# Patient Record
Sex: Male | Born: 1960
Health system: Southern US, Community
[De-identification: ages and names within clinical notes are randomized; demographics above are authoritative.]

## PROBLEM LIST (undated history)

## (undated) DIAGNOSIS — E785 Hyperlipidemia, unspecified: Secondary | ICD-10-CM

## (undated) DIAGNOSIS — I5020 Unspecified systolic (congestive) heart failure: Secondary | ICD-10-CM

## (undated) DIAGNOSIS — I10 Essential (primary) hypertension: Secondary | ICD-10-CM

## (undated) DIAGNOSIS — I4891 Unspecified atrial fibrillation: Secondary | ICD-10-CM

## (undated) DIAGNOSIS — I499 Cardiac arrhythmia, unspecified: Secondary | ICD-10-CM

## (undated) DIAGNOSIS — Z8709 Personal history of other diseases of the respiratory system: Secondary | ICD-10-CM

## (undated) DIAGNOSIS — I340 Nonrheumatic mitral (valve) insufficiency: Secondary | ICD-10-CM

## (undated) DIAGNOSIS — Z8619 Personal history of other infectious and parasitic diseases: Secondary | ICD-10-CM

## (undated) DIAGNOSIS — E119 Type 2 diabetes mellitus without complications: Secondary | ICD-10-CM

## (undated) HISTORY — DX: Personal history of other diseases of the respiratory system: Z87.09

## (undated) HISTORY — DX: Type 2 diabetes mellitus without complications: E11.9

## (undated) HISTORY — DX: Unspecified systolic (congestive) heart failure: I50.20

## (undated) HISTORY — DX: Personal history of other infectious and parasitic diseases: Z86.19

## (undated) HISTORY — DX: Nonrheumatic mitral (valve) insufficiency: I34.0

## (undated) HISTORY — PX: SPINAL FUSION: SHX223

---

## 1999-01-09 ENCOUNTER — Encounter: Payer: Self-pay | Admitting: Neurosurgery

## 1999-01-09 ENCOUNTER — Ambulatory Visit (HOSPITAL_COMMUNITY): Admission: RE | Admit: 1999-01-09 | Discharge: 1999-01-09 | Payer: Self-pay | Admitting: Neurosurgery

## 1999-02-16 ENCOUNTER — Inpatient Hospital Stay (HOSPITAL_COMMUNITY): Admission: RE | Admit: 1999-02-16 | Discharge: 1999-02-17 | Payer: Self-pay | Admitting: Neurosurgery

## 1999-02-16 ENCOUNTER — Encounter: Payer: Self-pay | Admitting: Neurosurgery

## 2002-04-23 ENCOUNTER — Ambulatory Visit (HOSPITAL_COMMUNITY): Admission: RE | Admit: 2002-04-23 | Discharge: 2002-04-23 | Payer: Self-pay | Admitting: Cardiology

## 2002-04-27 ENCOUNTER — Ambulatory Visit (HOSPITAL_COMMUNITY): Admission: RE | Admit: 2002-04-27 | Discharge: 2002-04-27 | Payer: Self-pay | Admitting: Cardiology

## 2002-04-27 ENCOUNTER — Encounter: Payer: Self-pay | Admitting: Cardiology

## 2002-05-20 HISTORY — PX: MITRAL VALVE REPAIR: SHX2039

## 2002-06-04 ENCOUNTER — Encounter: Payer: Self-pay | Admitting: Thoracic Surgery (Cardiothoracic Vascular Surgery)

## 2002-06-08 ENCOUNTER — Encounter: Payer: Self-pay | Admitting: Thoracic Surgery (Cardiothoracic Vascular Surgery)

## 2002-06-08 ENCOUNTER — Inpatient Hospital Stay (HOSPITAL_COMMUNITY)
Admission: RE | Admit: 2002-06-08 | Discharge: 2002-06-15 | Payer: Self-pay | Admitting: Thoracic Surgery (Cardiothoracic Vascular Surgery)

## 2002-06-09 ENCOUNTER — Encounter: Payer: Self-pay | Admitting: Thoracic Surgery (Cardiothoracic Vascular Surgery)

## 2002-06-10 ENCOUNTER — Encounter: Payer: Self-pay | Admitting: Thoracic Surgery (Cardiothoracic Vascular Surgery)

## 2005-11-22 DIAGNOSIS — E785 Hyperlipidemia, unspecified: Secondary | ICD-10-CM | POA: Insufficient documentation

## 2007-08-12 HISTORY — PX: DOPPLER ECHOCARDIOGRAPHY: SHX263

## 2009-01-18 DIAGNOSIS — Z8709 Personal history of other diseases of the respiratory system: Secondary | ICD-10-CM

## 2009-01-18 HISTORY — DX: Personal history of other diseases of the respiratory system: Z87.09

## 2009-01-21 ENCOUNTER — Emergency Department: Payer: Self-pay | Admitting: Emergency Medicine

## 2009-01-21 ENCOUNTER — Ambulatory Visit: Payer: Self-pay | Admitting: Internal Medicine

## 2009-01-21 ENCOUNTER — Inpatient Hospital Stay: Payer: Self-pay | Admitting: Internal Medicine

## 2009-03-06 ENCOUNTER — Ambulatory Visit: Payer: Self-pay | Admitting: Specialist

## 2009-03-17 ENCOUNTER — Ambulatory Visit: Payer: Self-pay | Admitting: General Surgery

## 2009-03-20 ENCOUNTER — Ambulatory Visit: Payer: Self-pay | Admitting: General Surgery

## 2009-03-27 ENCOUNTER — Ambulatory Visit: Payer: Self-pay | Admitting: General Surgery

## 2009-04-13 ENCOUNTER — Ambulatory Visit: Payer: Self-pay | Admitting: General Surgery

## 2009-05-23 ENCOUNTER — Ambulatory Visit: Payer: Self-pay | Admitting: General Surgery

## 2009-06-27 ENCOUNTER — Ambulatory Visit: Payer: Self-pay | Admitting: Family Medicine

## 2011-08-22 IMAGING — CR DG CHEST 2V
1 series · 2 of 2 positions shown · non-contrast
Comparison: none

REASON FOR EXAM: pneumonia
COMMENTS:

[Series 1: view not recorded · 0.17mm/px · 2 of 2 slices shown]
[im 1/2]
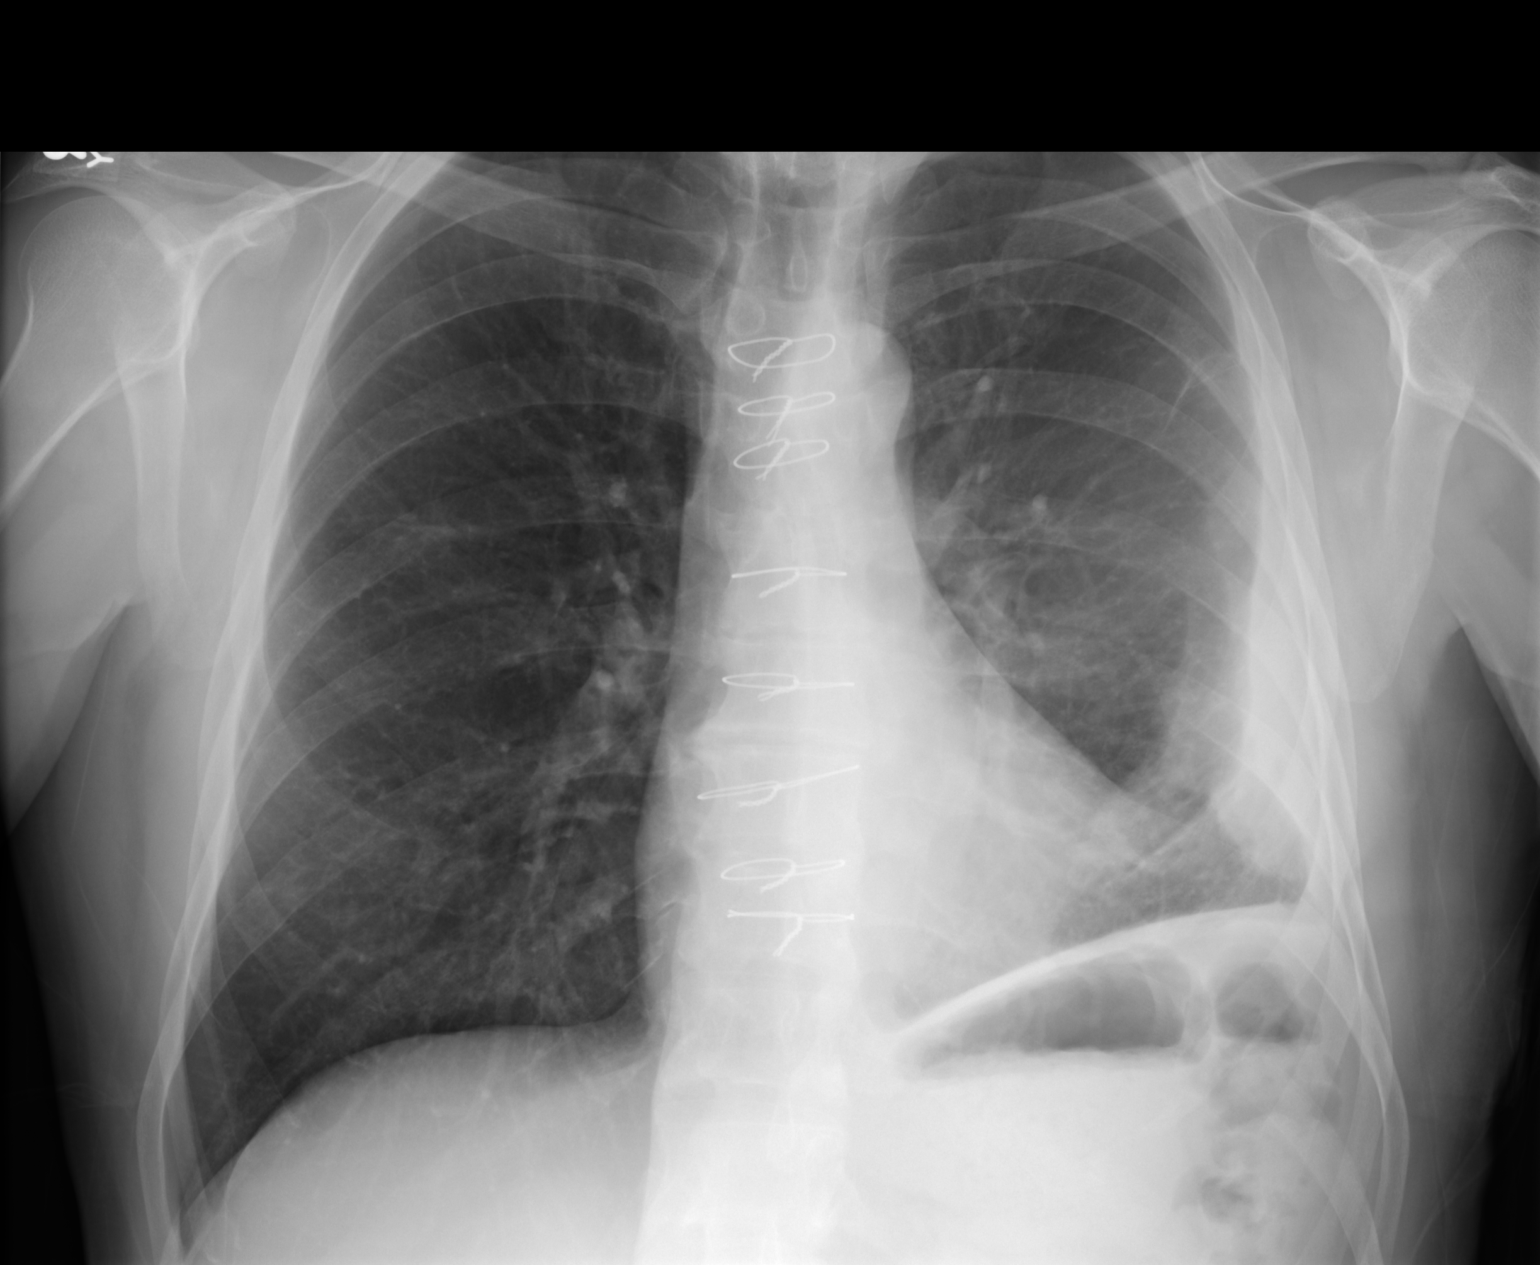
[im 2/2]
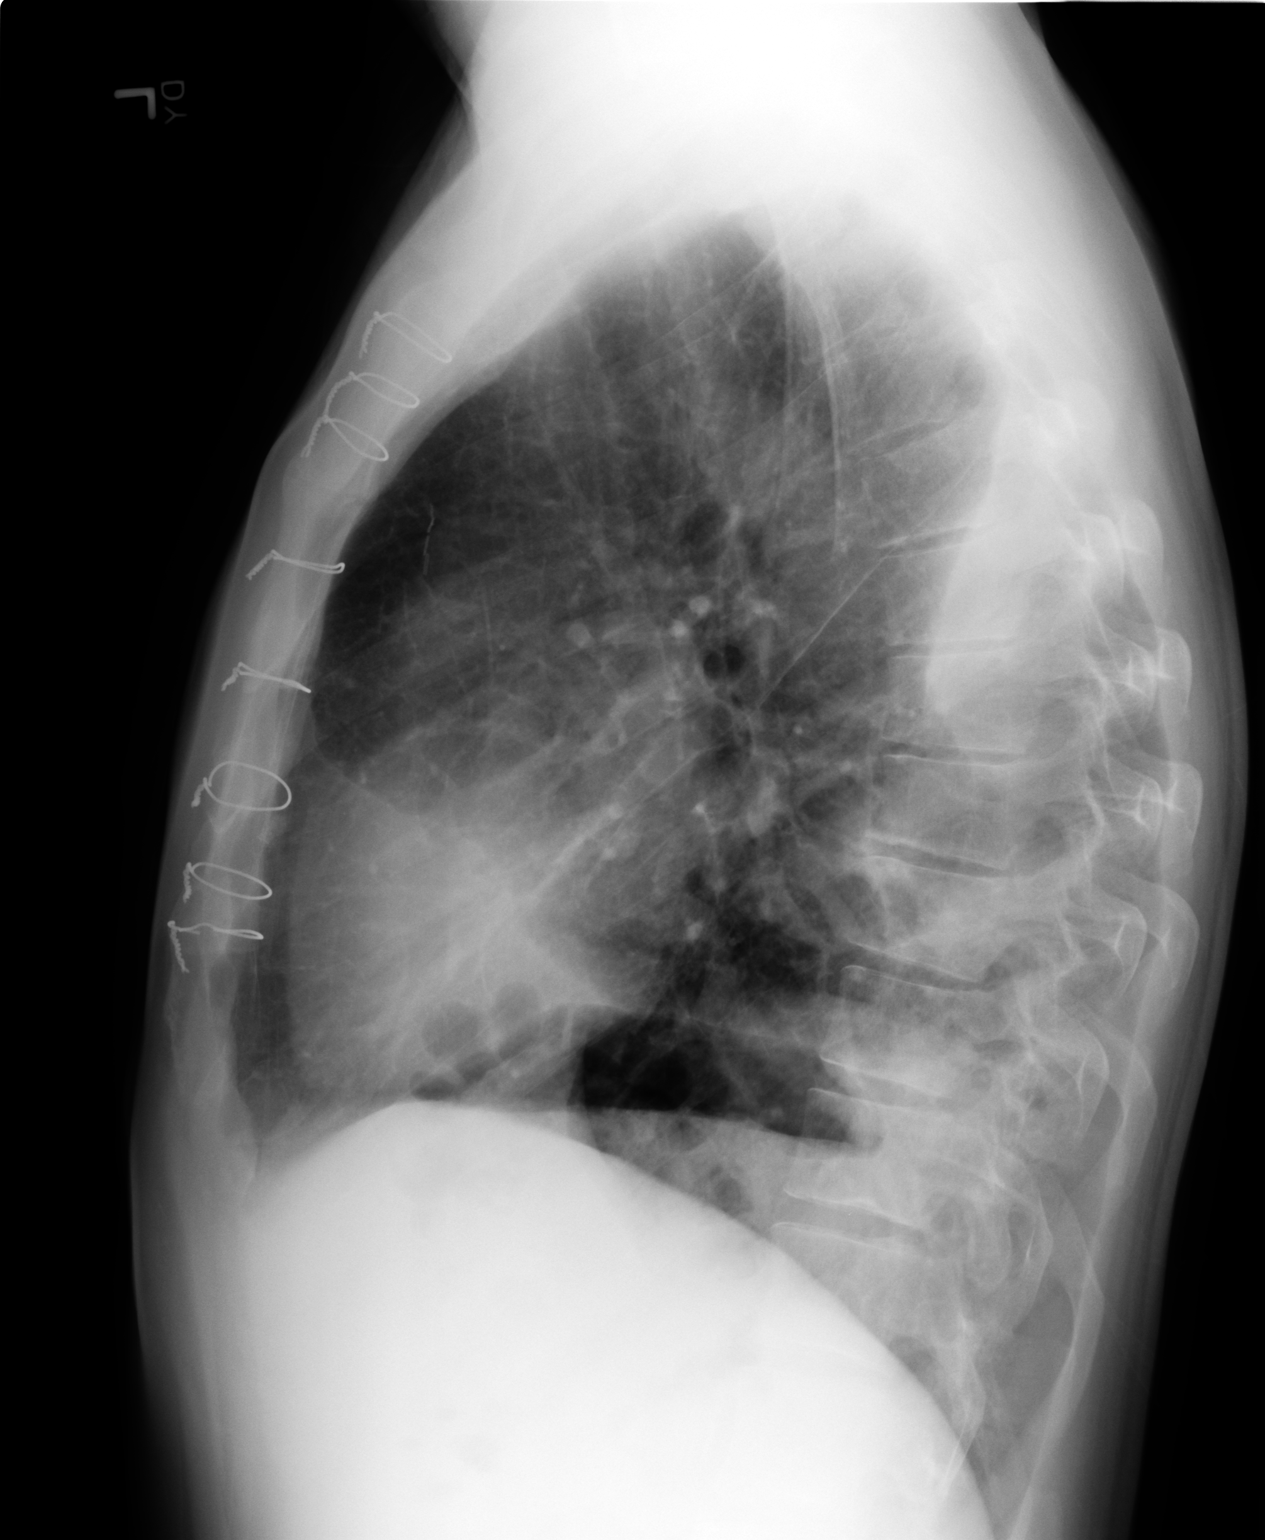

[2 of 2 positions shown; findings below may reference images not displayed]

PROCEDURE:     DXR - DXR CHEST PA (OR AP) AND LATERAL  - January 29, 2009  [DATE]

RESULT:     Comparison is made to the previous examination of 01/27/2009.

There is pleural thickening or residual pleural effusion on the left with
slight elevation of the left hemidiaphragm. Sternotomy wires are present.
The cardiac silhouette is normal. Previous anterior cervical fusion changes
are demonstrated. There is some basilar infiltrate or atelectasis present on
the left.
IMPRESSION: Improving aeration on the left with some residual
atelectasis or infiltrate and pleural thickening or pleural effusion.

## 2011-09-02 DIAGNOSIS — Z860101 Personal history of adenomatous and serrated colon polyps: Secondary | ICD-10-CM | POA: Insufficient documentation

## 2011-09-02 DIAGNOSIS — Z86010 Personal history of colonic polyps: Secondary | ICD-10-CM | POA: Insufficient documentation

## 2011-09-02 LAB — HM COLONOSCOPY

## 2012-05-18 ENCOUNTER — Ambulatory Visit: Payer: Self-pay | Admitting: Family Medicine

## 2012-12-09 LAB — PSA: PSA: 1.2

## 2013-12-06 LAB — LIPID PANEL
Cholesterol: 210 mg/dL — AB (ref 0–200)
HDL: 60 mg/dL (ref 35–70)
LDL Cholesterol: 136 mg/dL
TRIGLYCERIDES: 68 mg/dL (ref 40–160)

## 2013-12-06 LAB — BASIC METABOLIC PANEL
BUN: 19 mg/dL (ref 4–21)
CREATININE: 0.9 mg/dL (ref 0.6–1.3)
GLUCOSE: 116 mg/dL
Potassium: 5.6 mmol/L — AB (ref 3.4–5.3)
Sodium: 139 mmol/L (ref 137–147)

## 2014-06-14 LAB — HEMOGLOBIN A1C: HEMOGLOBIN A1C: 5.6 % (ref 4.0–6.0)

## 2014-08-26 ENCOUNTER — Other Ambulatory Visit: Payer: Self-pay | Admitting: Family Medicine

## 2014-08-26 NOTE — Telephone Encounter (Signed)
OK to refill

## 2014-08-26 NOTE — Telephone Encounter (Signed)
Prescription called in to pharmacy

## 2014-09-26 ENCOUNTER — Other Ambulatory Visit: Payer: Self-pay | Admitting: Family Medicine

## 2014-09-26 NOTE — Telephone Encounter (Signed)
Please call in the following medication.   Surescripts Out Interface   Mila Merry Rx Refill  4 hours ago   (9:32 AM)    [facility] [phone]   [facility]  4 hours ago   (9:32 AM)         New medications from outside sources are available for reconciliation.       Requested Medications     Medication name:  Name from pharmacy:  ALPRAZolam (XANAX) 1 MG tablet ALPRAZOLAM  TABLETS    Sig: TAKE 1/2 TO 1 TABLET BY MOUTH THREE TIMES DAILY AS NEEDED    Dispense: 90 tablet   Refills: 3

## 2014-09-26 NOTE — Telephone Encounter (Signed)
Rx called in to pharmacy. 

## 2014-10-06 ENCOUNTER — Other Ambulatory Visit: Payer: Self-pay

## 2014-10-06 ENCOUNTER — Encounter: Payer: Self-pay | Admitting: Emergency Medicine

## 2014-10-06 ENCOUNTER — Emergency Department
Admission: EM | Admit: 2014-10-06 | Discharge: 2014-10-06 | Disposition: A | Discharge status: Home / Self Care | Payer: BLUE CROSS/BLUE SHIELD | Attending: Student | Admitting: Student

## 2014-10-06 ENCOUNTER — Emergency Department: Payer: BLUE CROSS/BLUE SHIELD

## 2014-10-06 DIAGNOSIS — I10 Essential (primary) hypertension: Secondary | ICD-10-CM | POA: Diagnosis not present

## 2014-10-06 DIAGNOSIS — Z79899 Other long term (current) drug therapy: Secondary | ICD-10-CM | POA: Diagnosis not present

## 2014-10-06 DIAGNOSIS — R079 Chest pain, unspecified: Secondary | ICD-10-CM | POA: Insufficient documentation

## 2014-10-06 DIAGNOSIS — R0602 Shortness of breath: Secondary | ICD-10-CM | POA: Diagnosis present

## 2014-10-06 HISTORY — DX: Essential (primary) hypertension: I10

## 2014-10-06 LAB — CBC
HEMATOCRIT: 47.7 % (ref 40.0–52.0)
Hemoglobin: 16 g/dL (ref 13.0–18.0)
MCH: 30 pg (ref 26.0–34.0)
MCHC: 33.5 g/dL (ref 32.0–36.0)
MCV: 89.7 fL (ref 80.0–100.0)
Platelets: 280 10*3/uL (ref 150–440)
RBC: 5.32 MIL/uL (ref 4.40–5.90)
RDW: 13.5 % (ref 11.5–14.5)
WBC: 13.2 10*3/uL — AB (ref 3.8–10.6)

## 2014-10-06 LAB — BASIC METABOLIC PANEL
Anion gap: 9 (ref 5–15)
BUN: 19 mg/dL (ref 6–20)
CHLORIDE: 102 mmol/L (ref 101–111)
CO2: 27 mmol/L (ref 22–32)
CREATININE: 0.78 mg/dL (ref 0.61–1.24)
Calcium: 10.9 mg/dL — ABNORMAL HIGH (ref 8.9–10.3)
GFR calc Af Amer: >60 mL/min (ref >60)
GFR calc non Af Amer: >60 mL/min (ref >60)
Glucose, Bld: 124 mg/dL — ABNORMAL HIGH (ref 65–99)
POTASSIUM: 4.5 mmol/L (ref 3.5–5.1)
SODIUM: 138 mmol/L (ref 135–145)

## 2014-10-06 LAB — TROPONIN I
Troponin I: <0.03 ng/mL (ref <0.031)
Troponin I: <0.03 ng/mL (ref <0.031)

## 2014-10-06 LAB — HEPATIC FUNCTION PANEL
ALBUMIN: 4.8 g/dL (ref 3.5–5.0)
ALK PHOS: 86 U/L (ref 38–126)
ALT: 32 U/L (ref 17–63)
AST: 29 U/L (ref 15–41)
BILIRUBIN DIRECT: 0.1 mg/dL (ref 0.1–0.5)
BILIRUBIN TOTAL: 0.5 mg/dL (ref 0.3–1.2)
Indirect Bilirubin: 0.4 mg/dL (ref 0.3–0.9)
Total Protein: 7.7 g/dL (ref 6.5–8.1)

## 2014-10-06 MED ORDER — ASPIRIN 81 MG PO CHEW
324.0000 mg | CHEWABLE_TABLET | Freq: Once | ORAL | Status: AC
  Administered 2014-10-06: 324 mg via ORAL
  Filled 2014-10-06: qty 4

## 2014-10-06 NOTE — ED Provider Notes (Signed)
[facility] Emergency Department Provider Note  ____________________________________________  Time seen: Approximately 9:13 AM  I have reviewed the triage vital signs and the nursing notes.   HISTORY  Chief Complaint Shortness of Breath    HPI Samuel Woods is a 54 y.o. male with history of hypertension, anxiety, hypercholesterolemia, history of mitral valve replair on aspirin who presents for evaluation of sudden onset substernal chest pain. Patient awoke from sleep at 3:30 this morning with severe substernal chest pain and a sensation that his "heart was going to explode". He reports it lasted for 2 hours and has subsided. Currently he feels "sore" in the middle of his chest but denies any other pain. He is complaining of mild shortness of breath. His chest pain was associated with mild shortness of breath when it was at its maximum. No nausea, no diaphoresis. He has had productive cough for the past 2 weeks. No fever. No modifying factors. He has had chest pain like this before "after my mitral valve surgery whenever I would cough".   Past Medical History  Diagnosis Date  . Hypertension   . Anxiety   . Hypercholesteremia     There are no active problems to display for this patient.   History reviewed. No pertinent past surgical history.  Current Outpatient Rx  Name  Route  Sig  Dispense  Refill  . ALPRAZolam (XANAX) 1 MG tablet      TAKE 1/2 TO 1 TABLET BY MOUTH THREE TIMES DAILY AS NEEDED   90 tablet   3   . amLODipine (NORVASC) 10 MG tablet   Oral   Take 10 mg by mouth daily.         Marland Kitchen aspirin EC 81 MG tablet   Oral   Take 243 mg by mouth once. PT states that he takes which every aspirin is the cheapest. Sometimes its the 81mg  and sometimes 325mg .         . atorvastatin (LIPITOR) 20 MG tablet   Oral   Take 20 mg by mouth daily.         . Multiple Vitamin (MULTIVITAMIN) capsule   Oral   Take 1 capsule by mouth daily.            Allergies Review of patient's allergies indicates not on file.  History reviewed. No pertinent family history.  Social History Social History  Substance Use Topics  . Smoking status: None  . Smokeless tobacco: None  . Alcohol Use: None    Review of Systems Constitutional: No fever/chills Eyes: No visual changes. ENT: No sore throat. Cardiovascular: + chest pain. Respiratory: + shortness of breath. Gastrointestinal: No abdominal pain.  No nausea, no vomiting.  No diarrhea.  No constipation. Genitourinary: Negative for dysuria. Musculoskeletal: Negative for back pain. Skin: Negative for rash. Neurological: Negative for headaches, focal weakness or numbness.  10-point ROS otherwise negative.  ____________________________________________   PHYSICAL EXAM:  VITAL SIGNS: ED Triage Vitals  Enc Vitals Group     BP 10/06/14 0858 182/101 mmHg     Pulse Rate 10/06/14 0858 88     Resp 10/06/14 0858 18     Temp 10/06/14 0858 98 F (36.7 C)     Temp Source 10/06/14 0858 Oral     SpO2 10/06/14 0858 96 %     Weight 10/06/14 0858 175 lb (79.379 kg)     Height 10/06/14 0858 6' (1.829 m)     Head Cir --  Peak Flow --      Pain Score 10/06/14 0858 3     Pain Loc --      Pain Edu? --      Excl. in GC? --     Constitutional: Alert and oriented. Well appearing and in no acute distress. Eyes: Conjunctivae are normal. PERRL. EOMI. Head: Atraumatic. Nose: No congestion/rhinnorhea. Mouth/Throat: Mucous membranes are moist.  Oropharynx non-erythematous. Neck: No stridor.   Cardiovascular: Normal rate, regular rhythm. + systolic murmur.  Good peripheral circulation. Respiratory: Normal respiratory effort.  No retractions. Lungs CTAB. Gastrointestinal: Soft and nontender. No distention. No abdominal bruits. No CVA tenderness. Genitourinary: deferred Musculoskeletal: No lower extremity tenderness nor edema.  No joint effusions. Tenderness to palpation in the lower  sternum. Neurologic:  Normal speech and language. No gross focal neurologic deficits are appreciated. No gait instability. Skin:  Skin is warm, dry and intact. No rash noted. Psychiatric: Mood and affect are normal. Speech and behavior are normal.  ____________________________________________   LABS (all labs ordered are listed, but only abnormal results are displayed)  Labs Reviewed  BASIC METABOLIC PANEL - Abnormal; Notable for the following:    Glucose, Bld 124 (*)    Calcium 10.9 (*)    All other components within normal limits  CBC - Abnormal; Notable for the following:    WBC 13.2 (*)    All other components within normal limits  TROPONIN I  HEPATIC FUNCTION PANEL  TROPONIN I   ____________________________________________  EKG  ED ECG REPORT I, Gayla Doss, the attending physician, personally viewed and interpreted this ECG.   Date: 10/06/2014  EKG Time: 09:00  Rate: 90  Rhythm: normal sinus rhythm  Axis: normal  Intervals:none  ST&T Change: No acute ST segment elevation. Q-wave in lead V1, V2. T wave inversions in lead aVL. Similar to EKG on 01/21/2010.  ____________________________________________  RADIOLOGY  CXR  FINDINGS: A very small focus of linear scar is seen in the left lower lobe, unchanged. The lungs are otherwise clear. Heart size is normal. The patient is status post CABG. No pneumothorax or pleural effusion. No focal bony abnormality. Postoperative change of lower cervical fusion is noted.  IMPRESSION: No active cardiopulmonary disease.  ____________________________________________   PROCEDURES  Procedure(s) performed: None  Critical Care performed: No  ____________________________________________   INITIAL IMPRESSION / ASSESSMENT AND PLAN / ED COURSE  Pertinent labs & imaging results that were available during my care of the patient were reviewed by me and considered in my medical decision making (see chart for  details).  Samuel Woods is a 54 y.o. male with history of hypertension, anxiety, hypercholesterolemia, history of mitral valve replair on aspirin who presents for evaluation of sudden onset substernal chest pain initially severe, now faint. It was nonradiating and occurred at rest. EKG is unchanged from prior. Pain is not exertional, does not radiate, not associate with any nausea or sudden diaphoresis. He has no personal history of coronary artery disease or family history of coronary artery disease. He is low risk for ACS however will obtain 2 sets of troponins. He has no risk factors for PE, his pain is not pleuritic in nature, is not hypoxic or tachypnea, and I doubt PE. Pain not ripping or tearing in nature, does not radiate to the back or down towards the feet and I think acute aortic dissection is unlikely. He does have tenderness to palpation in the lower sternum and given recent cough, this may be musculo skeletal in nature.  At this time, patient appears well, vital signs stable, he is afebrile. Plan for screening cardiac labs, EKG, chest x-ray. We'll give aspirin. Reassess for disposition.  ----------------------------------------- 3:50 PM on 10/06/2014 ----------------------------------------- Labs notable for mild leukocytosis. 2 sets of troponins are Negative (less than 0.03). The patient is asymptomatic with complete resolution of pain at this time. He has not had any recurrence of the chest pain he experienced this morning while being observed in the emergency department for the past 6 hours.. Aspirin was given. Chest x-ray read notes that the patient is "status post CABG" however the patient adamantly denies any history of coronary artery disease, no history of bypass, the only operation that he's had is mitral valve repair. I doubt ACS. We discussed extensive return precautions, symptomatic treatment for his pain with Motrin Tylenol, and need for close PCP follow-up. He is verbal with the  discharge plan.  ____________________________________________   FINAL CLINICAL IMPRESSION(S) / ED DIAGNOSES  Final diagnoses:  Chest pain, unspecified chest pain type      Gayla Doss, MD 10/06/14 1555

## 2014-10-06 NOTE — ED Notes (Signed)
Occasional cough with yellowish sputum, pt currently with even unlabored respirations, speaking in complete sentences, complaining of chest soreness 2/10

## 2014-10-06 NOTE — ED Notes (Signed)
Patient reports waking this morning with "massive" chest pain that lasted from about 3:30 to 5:30 now reports just being extremely short of breath.

## 2014-10-18 ENCOUNTER — Other Ambulatory Visit: Payer: Self-pay | Admitting: Family Medicine

## 2014-10-18 DIAGNOSIS — M545 Low back pain: Secondary | ICD-10-CM

## 2014-10-19 NOTE — Telephone Encounter (Signed)
Ok to phone in rx. Thanks.  

## 2014-10-19 NOTE — Telephone Encounter (Signed)
Last prescribed 07/24/2013 # 30 with 6 refills. Last office visit was 06/14/2014 for follow up HTN, Lumbago, Anxiety and prediabetes.

## 2014-12-09 DIAGNOSIS — I10 Essential (primary) hypertension: Secondary | ICD-10-CM | POA: Insufficient documentation

## 2014-12-09 DIAGNOSIS — E669 Obesity, unspecified: Secondary | ICD-10-CM | POA: Insufficient documentation

## 2014-12-09 DIAGNOSIS — R7303 Prediabetes: Secondary | ICD-10-CM | POA: Insufficient documentation

## 2014-12-09 DIAGNOSIS — E119 Type 2 diabetes mellitus without complications: Secondary | ICD-10-CM | POA: Insufficient documentation

## 2014-12-12 ENCOUNTER — Encounter: Payer: Self-pay | Admitting: Family Medicine

## 2014-12-12 ENCOUNTER — Ambulatory Visit (INDEPENDENT_AMBULATORY_CARE_PROVIDER_SITE_OTHER): Payer: BLUE CROSS/BLUE SHIELD | Admitting: Family Medicine

## 2014-12-12 VITALS — BP 120/78 | HR 86 | Temp 98.8°F | Resp 16 | Ht 72.0 in | Wt 191.0 lb

## 2014-12-12 DIAGNOSIS — I10 Essential (primary) hypertension: Secondary | ICD-10-CM

## 2014-12-12 DIAGNOSIS — E785 Hyperlipidemia, unspecified: Secondary | ICD-10-CM | POA: Diagnosis not present

## 2014-12-12 DIAGNOSIS — M4716 Other spondylosis with myelopathy, lumbar region: Secondary | ICD-10-CM | POA: Diagnosis not present

## 2014-12-12 DIAGNOSIS — Z23 Encounter for immunization: Secondary | ICD-10-CM | POA: Diagnosis not present

## 2014-12-12 DIAGNOSIS — R7303 Prediabetes: Secondary | ICD-10-CM | POA: Diagnosis not present

## 2014-12-12 LAB — POCT GLYCOSYLATED HEMOGLOBIN (HGB A1C)
Est. average glucose Bld gHb Est-mCnc: 120
HEMOGLOBIN A1C: 5.8

## 2014-12-12 MED ORDER — OXYCODONE-ACETAMINOPHEN 5-325 MG PO TABS: 1.0000 | ORAL_TABLET | Freq: Three times a day (TID) | ORAL | Status: DC | PRN

## 2014-12-12 NOTE — Progress Notes (Signed)
Patient: Samuel Woods Male    DOB: 06/26/1960   54 y.o.   MRN: [id number] Visit Date: 12/12/2014  Today's Provider: Mila Merryonald Fisher, MD   Chief Complaint  Patient presents with  . Follow-up  . Hypertension  . Anxiety  . Back Pain   Subjective:    HPI   Pre-diabetes Follow-up for pre-diabetes. Is generally avoid sweets and starchy foods in diet.  Lab Results  Component Value Date   HGBA1C 5.6 06/14/2014     Back pain States that occasional oxycodone/apap continues to work well. He takes one a couple of time of week to help rest when pain flares Up. Marland Kitchen.   Hypertension, follow-up:  BP Readings from Last 3 Encounters:  12/12/14 120/78  06/14/14 120/82  10/06/14 144/93    He was last seen for hypertension 6 months ago.  BP at that visit was 120/82. Management since that visit includes none. He reports good compliance with treatment. He is not having side effects. none  He is exercising. He is adherent to low salt diet.   Outside blood pressures are n/a. He is experiencing chest pain.  Patient denies chest pain.   Cardiovascular risk factors include none.  Use of agents associated with hypertension: none.     Weight trend: fluctuating a bit Wt Readings from Last 3 Encounters:  12/12/14 191 lb (86.637 kg)  06/14/14 190 lb (86.183 kg)  10/06/14 175 lb (79.379 kg)    Current diet: well balanced  ----------------------------------------------------------------------    No Known Allergies Previous Medications   ALPRAZOLAM (XANAX) 1 MG TABLET    TAKE 1/2 TO 1 TABLET BY MOUTH THREE TIMES DAILY AS NEEDED   AMLODIPINE (NORVASC) 10 MG TABLET    Take 10 mg by mouth daily.   ASPIRIN 325 MG EC TABLET    Take 162.5 mg by mouth daily.   ASPIRIN EC 81 MG TABLET    Take 243 mg by mouth once. PT states that he takes which every aspirin is the cheapest. Sometimes its the 81mg  and sometimes 325mg .   ATORVASTATIN (LIPITOR) 20 MG TABLET    Take 20 mg by mouth daily.   CYCLOBENZAPRINE (FLEXERIL) 5 MG TABLET    TAKE 1 TABLET BY MOUTH EVERY 6 HOURS AS NEEDED   IBUPROFEN (ADVIL,MOTRIN) 200 MG TABLET    Take 200 mg by mouth every 6 (six) hours as needed.   MULTIPLE VITAMIN (MULTIVITAMIN) CAPSULE    Take 1 capsule by mouth daily.   OXYCODONE-ACETAMINOPHEN (PERCOCET) 5-325 MG TABLET    Take 1 tablet by mouth daily as needed for severe pain. Up to 1 daily    Review of Systems  Cardiovascular: Negative for chest pain, palpitations and leg swelling.  Musculoskeletal: Positive for back pain.  Neurological: Negative for dizziness and light-headedness.    Social History  Substance Use Topics  . Smoking status: Former Games developermoker  . Smokeless tobacco: Not on file  . Alcohol Use: 0.0 oz/week    0 Standard drinks or equivalent per week     Comment: occasional use   Objective:   BP 120/78 mmHg  Pulse 86  Temp(Src) 98.8 F (37.1 C) (Oral)  Resp 16  Ht 6' (1.829 m)  Wt 191 lb (86.637 kg)  BMI 25.90 kg/m2  SpO2 97%  Physical Exam   General Appearance:    Alert, cooperative, no distress  Eyes:    PERRL, conjunctiva/corneas clear, EOM's intact       Lungs:  Clear to auscultation bilaterally, respirations unlabored  Heart:    Regular rate and rhythm  Neurologic:   Awake, alert, oriented x 3. No apparent focal neurological           defect.       HgbA1c=5.8 Depression screen [facility] 2/9 12/12/2014  Decreased Interest 0  Down, Depressed, Hopeless 0  PHQ - 2 Score 0  Altered sleeping 0  Tired, decreased energy 0  Change in appetite 0  Feeling bad or failure about yourself  0  Trouble concentrating 0  Moving slowly or fidgety/restless 0  Suicidal thoughts 0  PHQ-9 Score 0  Difficult doing work/chores Not difficult at all        Assessment & Plan:     1. Essential hypertension Well controlled.  Continue current medications.   - Renal function panel  2. HLD (hyperlipidemia) He is tolerating atorvastatin well with no adverse effects.   - Lipid  panel - Hepatic function panel  3. Prediabetes Stable. Continue low glycemic index diet  4. Lumbar spondylosis with myelopathy  - oxyCODONE-acetaminophen (PERCOCET) 5-325 MG tablet; Take 1 tablet by mouth every 8 (eight) hours as needed for severe pain.  Dispense: 60 tablet; Refill: 0  5. Need for influenza vaccination  - Flu Vaccine QUAD 36+ mos IM       Mila Merry, MD  [facility]

## 2014-12-13 LAB — HEPATIC FUNCTION PANEL
ALT: 41 IU/L (ref 0–44)
AST: 21 IU/L (ref 0–40)
Alkaline Phosphatase: 126 IU/L — ABNORMAL HIGH (ref 39–117)
BILIRUBIN, DIRECT: 0.08 mg/dL (ref 0.00–0.40)
Bilirubin Total: 0.3 mg/dL (ref 0.0–1.2)
Total Protein: 7.2 g/dL (ref 6.0–8.5)

## 2014-12-13 LAB — RENAL FUNCTION PANEL
ALBUMIN: 5.1 g/dL (ref 3.5–5.5)
BUN/Creatinine Ratio: 23 — ABNORMAL HIGH (ref 9–20)
BUN: 21 mg/dL (ref 6–24)
CALCIUM: 10.4 mg/dL — AB (ref 8.7–10.2)
CHLORIDE: 104 mmol/L (ref 97–106)
CO2: 21 mmol/L (ref 18–29)
CREATININE: 0.9 mg/dL (ref 0.76–1.27)
GFR calc Af Amer: 111 mL/min/{1.73_m2} (ref >59)
GFR calc non Af Amer: 96 mL/min/{1.73_m2} (ref >59)
Glucose: 122 mg/dL — ABNORMAL HIGH (ref 65–99)
PHOSPHORUS: 3.8 mg/dL (ref 2.5–4.5)
POTASSIUM: 6.3 mmol/L — AB (ref 3.5–5.2)
SODIUM: 146 mmol/L — AB (ref 136–144)

## 2014-12-13 LAB — LIPID PANEL
CHOLESTEROL TOTAL: 215 mg/dL — AB (ref 100–199)
Chol/HDL Ratio: 4.4 ratio units (ref 0.0–5.0)
HDL: 49 mg/dL (ref >39)
LDL CALC: 121 mg/dL — AB (ref 0–99)
TRIGLYCERIDES: 226 mg/dL — AB (ref 0–149)
VLDL Cholesterol Cal: 45 mg/dL — ABNORMAL HIGH (ref 5–40)

## 2014-12-14 ENCOUNTER — Telehealth: Payer: Self-pay

## 2014-12-14 MED ORDER — HYDROCHLOROTHIAZIDE 25 MG PO TABS: 25.0000 mg | ORAL_TABLET | Freq: Every day | ORAL | Status: DC

## 2014-12-14 NOTE — Telephone Encounter (Signed)
Patient's wife Beverely LowJeannie (consent in chart) advised as directed below. RX sent to pharmacy. Patient will call back to schedule a follow up appointment.

## 2014-12-14 NOTE — Telephone Encounter (Signed)
-----   Message from Malva Limesonald E Fisher, MD sent at 12/13/2014  8:11 AM EDT ----- Potassium level is getting very high, which might be side effect of his blood pressure medication. Need to change from amlodipine to hydrochlorothiazide 25mg  once a day, #30.  Need to follow up 7-10 days to check potassium level and blood pressure.

## 2015-01-09 ENCOUNTER — Telehealth: Payer: Self-pay | Admitting: Family Medicine

## 2015-01-09 DIAGNOSIS — I10 Essential (primary) hypertension: Secondary | ICD-10-CM

## 2015-01-09 NOTE — Telephone Encounter (Signed)
Please print rx for renal panel for hypertension and leave at front desk. Thanks.

## 2015-01-09 NOTE — Telephone Encounter (Signed)
done

## 2015-01-09 NOTE — Telephone Encounter (Signed)
Pt needs order to have his potassium check because of the prescription he is now taking,.  Please call him when it is ready to be picked up..  Call back is [phone]  [facility], [place]

## 2015-01-10 ENCOUNTER — Other Ambulatory Visit: Payer: Self-pay | Admitting: Family Medicine

## 2015-01-25 ENCOUNTER — Other Ambulatory Visit: Payer: Self-pay | Admitting: Family Medicine

## 2015-01-25 NOTE — Telephone Encounter (Signed)
Rx called in to pharmacy. 

## 2015-01-25 NOTE — Telephone Encounter (Signed)
Please call in alprazolam.  

## 2015-02-07 ENCOUNTER — Telehealth: Payer: Self-pay | Admitting: Family Medicine

## 2015-02-07 NOTE — Telephone Encounter (Signed)
Please advise patient he needs to stop by lab to recheck renal panel, since his potassium was very high before we changed his BP medication last month. Lab order has been left at front desk to pick up. Does not need to be fasting. Thanks.

## 2015-02-17 NOTE — Telephone Encounter (Signed)
LMTCB

## 2015-02-17 NOTE — Telephone Encounter (Signed)
Patient's wife Jeronimo NormaJeanie advised as directed below.

## 2015-02-17 NOTE — Telephone Encounter (Signed)
Pt wife, Jeronimo NormaJeanie is returning call.  CB#[phone]/MW

## 2015-03-07 ENCOUNTER — Telehealth: Payer: Self-pay

## 2015-03-07 LAB — RENAL FUNCTION PANEL
ALBUMIN: 4.4 g/dL (ref 3.5–5.5)
BUN / CREAT RATIO: 23 — AB (ref 9–20)
BUN: 19 mg/dL (ref 6–24)
CALCIUM: 9.7 mg/dL (ref 8.7–10.2)
CHLORIDE: 98 mmol/L (ref 96–106)
CO2: 24 mmol/L (ref 18–29)
CREATININE: 0.84 mg/dL (ref 0.76–1.27)
GFR calc Af Amer: 114 mL/min/{1.73_m2} (ref >59)
GFR calc non Af Amer: 99 mL/min/{1.73_m2} (ref >59)
Glucose: 130 mg/dL — ABNORMAL HIGH (ref 65–99)
PHOSPHORUS: 3.3 mg/dL (ref 2.5–4.5)
Potassium: 4.7 mmol/L (ref 3.5–5.2)
Sodium: 139 mmol/L (ref 134–144)

## 2015-03-07 NOTE — Telephone Encounter (Signed)
-----   Message from Malva Limes, MD sent at 03/07/2015  8:00 AM EST ----- Potassium level is back down to normal. Continue current medications. Need follow up o.v. for BP or CPE in 2-3 months.

## 2015-03-07 NOTE — Telephone Encounter (Signed)
Advised patient's wife as below.  

## 2015-03-07 NOTE — Telephone Encounter (Signed)
Left message to call back  

## 2015-03-27 ENCOUNTER — Other Ambulatory Visit: Payer: Self-pay | Admitting: Family Medicine

## 2015-03-27 NOTE — Telephone Encounter (Signed)
Please call in alprazolam.  

## 2015-03-27 NOTE — Telephone Encounter (Signed)
Rx called in to pharmacy. 

## 2015-04-15 ENCOUNTER — Ambulatory Visit (INDEPENDENT_AMBULATORY_CARE_PROVIDER_SITE_OTHER): Payer: BLUE CROSS/BLUE SHIELD | Admitting: Family Medicine

## 2015-04-15 ENCOUNTER — Encounter: Payer: Self-pay | Admitting: Family Medicine

## 2015-04-15 VITALS — BP 122/82 | HR 79 | Temp 97.6°F | Resp 16 | Wt 182.4 lb

## 2015-04-15 DIAGNOSIS — J40 Bronchitis, not specified as acute or chronic: Secondary | ICD-10-CM

## 2015-04-15 MED ORDER — HYDROCODONE-HOMATROPINE 5-1.5 MG/5ML PO SYRP: ORAL_SOLUTION | ORAL | Status: DC

## 2015-04-15 MED ORDER — CEFDINIR 300 MG PO CAPS: 300.0000 mg | ORAL_CAPSULE | Freq: Two times a day (BID) | ORAL | Status: DC

## 2015-04-15 NOTE — Patient Instructions (Signed)
Add Mucinex D and add Delsym for cough as needed.

## 2015-04-15 NOTE — Progress Notes (Signed)
Subjective:     Patient ID: Samuel Woods, male   DOB: 18-Jun-1960, 55 y.o.   MRN: [id number]  HPI  Chief Complaint  Patient presents with  . Ear Pain    Patient comes in office today with complaints of ear pain, cough and congestion for the past week. Associated patient complains of decreased appetite and nausea, he denies diarrhea or vomitting.   Developed flu symptoms despite flu shot on 2/18. Feels that fever broke on 2/23 and he started to feel better. Sinus congestion improving but has developed cough productive of purulent sputum. Admits to resuming smoking 2-3 cigarettes a day.   Review of Systems     Objective:   Physical Exam  Constitutional: He appears well-developed and well-nourished. No distress.  Ears: T.M's intact without inflammation Sinuses: non-tender Throat: no tonsillar enlargement or exudate Neck: right anterior cervical tenderness Lungs: Inspiratory and expiratory wheezes/crackles     Assessment:    1. Bronchitis - cefdinir (OMNICEF) 300 MG capsule; Take 1 capsule (300 mg total) by mouth 2 (two) times daily.  Dispense: 14 capsule; Refill: 0 - HYDROcodone-homatropine (HYCODAN) 5-1.5 MG/5ML syrup; 5 ml 4-6 hours as needed for cough  Dispense: 240 mL; Refill: 0    Plan:    Discussed use of expectorants.

## 2015-04-17 ENCOUNTER — Other Ambulatory Visit: Payer: Self-pay | Admitting: Family Medicine

## 2015-04-17 DIAGNOSIS — M4716 Other spondylosis with myelopathy, lumbar region: Secondary | ICD-10-CM

## 2015-04-17 MED ORDER — OXYCODONE-ACETAMINOPHEN 5-325 MG PO TABS: 1.0000 | ORAL_TABLET | Freq: Three times a day (TID) | ORAL | Status: DC | PRN

## 2015-04-17 NOTE — Progress Notes (Signed)
Oxycodone rx is ready to pick up. But, he should not take this while he is taking the cough medication that was prescribed this weekend.

## 2015-04-17 NOTE — Progress Notes (Signed)
Patient was notified. Patient stated that he is not taking cough medication due to med giving him headaches.

## 2015-04-29 ENCOUNTER — Other Ambulatory Visit: Payer: Self-pay | Admitting: Family Medicine

## 2015-06-13 ENCOUNTER — Ambulatory Visit (INDEPENDENT_AMBULATORY_CARE_PROVIDER_SITE_OTHER): Payer: BLUE CROSS/BLUE SHIELD | Admitting: Family Medicine

## 2015-06-13 ENCOUNTER — Encounter: Payer: Self-pay | Admitting: Family Medicine

## 2015-06-13 VITALS — BP 140/88 | HR 88 | Temp 98.2°F | Resp 16 | Wt 182.0 lb

## 2015-06-13 DIAGNOSIS — N529 Male erectile dysfunction, unspecified: Secondary | ICD-10-CM

## 2015-06-13 DIAGNOSIS — I10 Essential (primary) hypertension: Secondary | ICD-10-CM

## 2015-06-13 DIAGNOSIS — M4716 Other spondylosis with myelopathy, lumbar region: Secondary | ICD-10-CM | POA: Diagnosis not present

## 2015-06-13 DIAGNOSIS — R7303 Prediabetes: Secondary | ICD-10-CM | POA: Diagnosis not present

## 2015-06-13 LAB — POCT GLYCOSYLATED HEMOGLOBIN (HGB A1C)
Est. average glucose Bld gHb Est-mCnc: 123
Hemoglobin A1C: 5.9

## 2015-06-13 MED ORDER — OXYCODONE-ACETAMINOPHEN 5-325 MG PO TABS: 1.0000 | ORAL_TABLET | Freq: Three times a day (TID) | ORAL | Status: DC | PRN

## 2015-06-13 MED ORDER — SILDENAFIL CITRATE 20 MG PO TABS: ORAL_TABLET | ORAL | Status: DC

## 2015-06-13 NOTE — Progress Notes (Signed)
Patient: Samuel Woods Male    DOB: 02/14/1961   55 y.o.   MRN: [id number] Visit Date: 06/13/2015  Today's Provider: Mila Merryonald Bhavin Monjaraz, MD   Chief Complaint  Patient presents with  . Hypertension    follow up  . Back Pain    follow up  . Hyperglycemia    follow up   Subjective:    HPI  Hypertension, follow-up:  BP Readings from Last 3 Encounters:  04/15/15 122/82  12/12/14 120/78  06/14/14 120/82    He was last seen for hypertension 6 months ago.  BP at that visit was 120/78. Management since that visit includes changing from Amlodpine to HCTZ due to potassium level getting too high. He reports good compliance with treatment. He is not having side effects.  He is exercising. He is not adherent to low salt diet.   Outside blood pressures are not being checked. He is experiencing none.  Patient denies chest pain, chest pressure/discomfort, claudication, dyspnea, exertional chest pressure/discomfort, fatigue, irregular heart beat, lower extremity edema, near-syncope, orthopnea, palpitations, paroxysmal nocturnal dyspnea, syncope and tachypnea.   Cardiovascular risk factors include dyslipidemia and hypertension.  Use of agents associated with hypertension: NSAIDS.     Weight trend: stable Wt Readings from Last 3 Encounters:  04/15/15 182 lb 6.4 oz (82.736 kg)  12/12/14 191 lb (86.637 kg)  06/14/14 190 lb (86.183 kg)    Current diet: well balanced   ED He took Cialis in the past which he states worked well. He has not required it for several years but states he has been having more difficulty the last several months and would like to try a generic ED medication.    Follow up Back pain: Last office visit was 6 months ago and no changes were made. Patient reports good compliance with treatment and good symptom control.     Prediabetes, Follow-up:   Lab Results  Component Value Date   HGBA1C 5.8 12/12/2014   HGBA1C 5.6 06/14/2014   GLUCOSE 130* 03/06/2015     GLUCOSE 122* 12/12/2014   GLUCOSE 124* 10/06/2014    Last seen for for this 6 months ago.  Management since that visit includes no changes. Patient was to continue low glycemic index diet. Current symptoms include none and have been stable.  Weight trend: stable Prior visit with dietician: no Current diet: well balanced Current exercise: walking  Pertinent Labs:    Component Value Date/Time   CHOL 215* 12/12/2014 0918   CHOL 210* 12/06/2013   TRIG 226* 12/12/2014 0918   CHOLHDL 4.4 12/12/2014 0918   CREATININE 0.84 03/06/2015 0817   CREATININE 0.9 12/06/2013    Wt Readings from Last 3 Encounters:  04/15/15 182 lb 6.4 oz (82.736 kg)  12/12/14 191 lb (86.637 kg)  06/14/14 190 lb (86.183 kg)       No Known Allergies Previous Medications   ALPRAZOLAM (XANAX) 1 MG TABLET    TAKE 1/2 TO 1 TABLET BY MOUTH THREE TIMES DAILY AS NEEDED   ASPIRIN EC 81 MG TABLET    Take 243 mg by mouth once. PT states that he takes which every aspirin is the cheapest. Sometimes its the 81mg  and sometimes 325mg .   ATORVASTATIN (LIPITOR) 20 MG TABLET    TAKE 1 TABLET BY MOUTH DAILY   CYCLOBENZAPRINE (FLEXERIL) 5 MG TABLET    TAKE 1 TABLET BY MOUTH EVERY 6 HOURS AS NEEDED   HYDROCHLOROTHIAZIDE (HYDRODIURIL) 25 MG TABLET    TAKE 1  TABLET BY MOUTH DAILY   IBUPROFEN (ADVIL,MOTRIN) 200 MG TABLET    Take 200 mg by mouth every 6 (six) hours as needed.   MULTIPLE VITAMIN (MULTIVITAMIN) CAPSULE    Take 1 capsule by mouth daily.   OXYCODONE-ACETAMINOPHEN (PERCOCET) 5-325 MG TABLET    Take 1 tablet by mouth every 8 (eight) hours as needed for severe pain.    Review of Systems  Constitutional: Negative for fever, chills and appetite change.  Respiratory: Negative for chest tightness, shortness of breath and wheezing.   Cardiovascular: Negative for chest pain and palpitations.  Gastrointestinal: Negative for nausea, vomiting and abdominal pain.  Musculoskeletal: Positive for back pain.    Social History   Substance Use Topics  . Smoking status: Former Games developer  . Smokeless tobacco: Not on file  . Alcohol Use: 0.0 oz/week    0 Standard drinks or equivalent per week     Comment: occasional use   Objective:   BP 140/88 mmHg  Pulse 88  Temp(Src) 98.2 F (36.8 C) (Oral)  Resp 16  Wt 182 lb (82.555 kg)  SpO2 97%  Physical Exam   General Appearance:    Alert, cooperative, no distress  Eyes:    PERRL, conjunctiva/corneas clear, EOM's intact       Lungs:     Clear to auscultation bilaterally, respirations unlabored  Heart:    Regular rate and rhythm  Neurologic:   Awake, alert, oriented x 3. No apparent focal neurological           defect.      A1c=5.9     Assessment & Plan:      1. Essential hypertension Stable. Continue current medications.    2. Prediabetes Avoid sweets and starchy foods and increase exercise - POCT glycosylated hemoglobin (Hb A1C)  3. Erectile dysfunction, unspecified erectile dysfunction type Try generic sildenafil - sildenafil (REVATIO) 20 MG tablet; 3-5 tablets as needed, no more than 5 in a day  Dispense: 50 tablet; Refill: 0  4. Lumbar spondylosis with myelopathy Does well with occasional percocet, needs refill today.  - oxyCODONE-acetaminophen (PERCOCET) 5-325 MG tablet; Take 1 tablet by mouth every 8 (eight) hours as needed for severe pain.  Dispense: 60 tablet; Refill: 0         Mila Merry, MD  [facility]

## 2015-06-14 ENCOUNTER — Telehealth: Payer: Self-pay

## 2015-06-14 NOTE — Telephone Encounter (Signed)
Patient states he went to pick up RX for Sildenafil and it will cost $880. Patient wanted to know if there is a cheaper medication? Please advise.

## 2015-06-14 NOTE — Telephone Encounter (Signed)
Patient was notified.

## 2015-06-14 NOTE — Telephone Encounter (Signed)
He needs to go to another pharmacy. This medication is usually about $50, unless the manufacture has recently increased price... Which wouldn't surprise me.  [facility].

## 2015-06-15 ENCOUNTER — Telehealth: Payer: Self-pay | Admitting: Family Medicine

## 2015-06-15 NOTE — Telephone Encounter (Signed)
Pt stated that he was advised by his insurance that we need to contact his insurance to authorize the medication. I advised that if it's a PA we need [facility] to fax us the information from [employer] insurance company as to why they didn't cover the RX. I advised pt to call [facility] to have them fax the needed info. Pt wanted a message sent back to let Dr. Sherrie MustacheFisher know that he needed this done to get the medication. Thanks TNP

## 2015-06-16 NOTE — Telephone Encounter (Signed)
Advised patient as below.  

## 2015-06-16 NOTE — Telephone Encounter (Signed)
Called patient, he states that the pharmacy told him that it would cost about $850 out of pocket for that prescription. I spoke with Dr. Sherrie MustacheFisher regarding this medications needing a Prior Auth. Per Dr. Sherrie MustacheFisher, there is no need to do a prior auth on this medication because it will not be approved since patient does not meet the criteria of "medically necessary" or have diagnosis of Pulmonary hypertension. Tried calling patient to advised him of this. Left message with patient wife to call back.

## 2015-06-27 ENCOUNTER — Other Ambulatory Visit: Payer: Self-pay | Admitting: Family Medicine

## 2015-06-27 NOTE — Telephone Encounter (Signed)
Please call in alprazolam.  

## 2015-06-27 NOTE — Telephone Encounter (Signed)
Rx called in to pharmacy. 

## 2015-07-08 ENCOUNTER — Other Ambulatory Visit: Payer: Self-pay | Admitting: Family Medicine

## 2015-08-23 ENCOUNTER — Other Ambulatory Visit: Payer: Self-pay | Admitting: Family Medicine

## 2015-08-24 NOTE — Telephone Encounter (Signed)
Please call in alprazolam.  

## 2015-08-24 NOTE — Telephone Encounter (Signed)
Rx called in to pharmacy. 

## 2015-11-14 DIAGNOSIS — S20211A Contusion of right front wall of thorax, initial encounter: Secondary | ICD-10-CM | POA: Diagnosis not present

## 2015-12-07 ENCOUNTER — Other Ambulatory Visit: Payer: Self-pay | Admitting: Family Medicine

## 2015-12-13 ENCOUNTER — Encounter: Payer: Self-pay | Admitting: Family Medicine

## 2015-12-13 ENCOUNTER — Ambulatory Visit (INDEPENDENT_AMBULATORY_CARE_PROVIDER_SITE_OTHER): Payer: BLUE CROSS/BLUE SHIELD | Admitting: Family Medicine

## 2015-12-13 ENCOUNTER — Other Ambulatory Visit: Payer: Self-pay | Admitting: Family Medicine

## 2015-12-13 VITALS — BP 140/104 | HR 87 | Temp 98.3°F | Resp 16 | Ht 72.0 in | Wt 174.0 lb

## 2015-12-13 DIAGNOSIS — F419 Anxiety disorder, unspecified: Secondary | ICD-10-CM | POA: Diagnosis not present

## 2015-12-13 DIAGNOSIS — Z0000 Encounter for general adult medical examination without abnormal findings: Secondary | ICD-10-CM

## 2015-12-13 DIAGNOSIS — M4716 Other spondylosis with myelopathy, lumbar region: Secondary | ICD-10-CM

## 2015-12-13 DIAGNOSIS — I10 Essential (primary) hypertension: Secondary | ICD-10-CM

## 2015-12-13 DIAGNOSIS — Z23 Encounter for immunization: Secondary | ICD-10-CM | POA: Diagnosis not present

## 2015-12-13 DIAGNOSIS — R739 Hyperglycemia, unspecified: Secondary | ICD-10-CM | POA: Diagnosis not present

## 2015-12-13 DIAGNOSIS — M545 Low back pain, unspecified: Secondary | ICD-10-CM

## 2015-12-13 DIAGNOSIS — E785 Hyperlipidemia, unspecified: Secondary | ICD-10-CM

## 2015-12-13 MED ORDER — OXYCODONE-ACETAMINOPHEN 5-325 MG PO TABS: 1.0000 | ORAL_TABLET | Freq: Three times a day (TID) | ORAL | 0 refills | Status: DC | PRN

## 2015-12-13 NOTE — Progress Notes (Signed)
Patient: Samuel Woods, Male    DOB: 01-25-1961, 55 y.o.   MRN: [id number] Visit Date: 12/13/2015  Today's Provider: Mila Merry, MD   Chief Complaint  Patient presents with  . Annual Exam  . Hypertension   Subjective:    Annual physical exam Samuel Woods is a 55 y.o. male who presents today for health maintenance and complete physical. He feels fairly well. He reports exercising yes/walk. He reports he is sleeping poorly.  ----------------------------------------------------------------   Prediabetes From 06/13/2015-stable, HgbA1c=5.9. Advised to avoid sweets and starchy foods and increase exercise.   Erectile dysfunction, unspecified erectile dysfunction type From 06/13/2015-Try generic sildenafil  Lumbar spondylosis with myelopathy: From 06/13/2015-no changes. Takes Occasional oxycodone which he tolerates well and remains effective.    Hypertension, follow-up:  BP Readings from Last 3 Encounters:  12/13/15 (!) 140/104  06/13/15 140/88  04/15/15 122/82    He was last seen for hypertension 6 months ago.  BP at that visit was 140/88. Management since that visit includes; no changes.He reports good compliance with treatment. He is not having side effects. none He is exercising. He is adherent to low salt diet.   Outside blood pressures are n/a. He is experiencing none.  Patient denies none.   Cardiovascular risk factors include predibetes.  Use of agents associated with hypertension: none.   ----------------------------------------------------------------  He reports he was in a physical alteration at work on 11-13-15 resulting in bruising around right lower ribs, left upper chest and right side of neck which are all still sore. He states he was fired from his job as a result of the incident and may lose insurance at the end of the month.   Review of Systems  Constitutional: Negative.   HENT: Negative.   Eyes: Negative.   Respiratory: Negative.     Cardiovascular: Positive for chest pain.  Gastrointestinal: Negative.   Endocrine: Negative.   Genitourinary: Negative.   Musculoskeletal: Positive for back pain and neck pain.  Skin: Negative.   Allergic/Immunologic: Negative.   Neurological: Negative.   Hematological: Negative.   Psychiatric/Behavioral: The patient is nervous/anxious.     Social History      He  reports that he has quit smoking. He does not have any smokeless tobacco history on file. He reports that he drinks alcohol. He reports that he does not use drugs.       Social History   Social History  . Marital status: Married    Spouse name: N/A  . Number of children: 2  . Years of education: N/A   Occupational History  . Works for [employer]    Social History Main Topics  . Smoking status: Former Games developer  . Smokeless tobacco: Not on file  . Alcohol use 0.0 oz/week     Comment: occasional use  . Drug use: No  . Sexual activity: Not on file   Other Topics Concern  . Not on file   Social History Narrative  . No narrative on file    Past Medical History:  Diagnosis Date  . History of chicken pox   . History of lung abscess 01/18/2009  . History of measles      Patient Active Problem List   Diagnosis Date Noted  . Hypertension 12/09/2014  . Obesity 12/09/2014  . Prediabetes 12/09/2014  . H/O adenomatous polyp of colon 09/02/2011  . Spondylosis with myelopathy 06/09/2009  . Lumbago 12/20/2008  . Mitral stenosis 08/12/2007  . History of tobacco  use 10/14/2006  . Anxiety disorder 12/13/2005  . Impotence of organic origin 12/01/2005  . HLD (hyperlipidemia) 11/22/2005    Past Surgical History:  Procedure Laterality Date  . DOPPLER ECHOCARDIOGRAPHY  08/12/2007   Mild to moderate stenosis. Trace TR.Mild pulmonary hypertension. Borderline LVH. Mild depressed right systolic function. LVEF=50-55%  . MITRAL VALVE REPAIR  05/2002  . SPINAL FUSION      Family History        Family Status  Relation  Status  . Mother Alive  . Father Deceased  . Sister Alive  . Brother Alive  . Sister Alive  . Other Alive        His family history includes Alcohol abuse in his father; Cancer in his other; Hypertension in his brother.    No Known Allergies   Current Outpatient Prescriptions:  .  ALPRAZolam (XANAX) 1 MG tablet, TAKE 1/2 TO 1 TABLET BY MOUTH THREE TIMES DAILY AS NEEDED, Disp: 90 tablet, Rfl: 5 .  aspirin EC 81 MG tablet, Take 81 mg by mouth once. PT states that he takes which every aspirin is the cheapest. Sometimes its the 81mg  and sometimes 325mg ., Disp: , Rfl:  .  atorvastatin (LIPITOR) 20 MG tablet, TAKE 1 TABLET BY MOUTH DAILY, Disp: 30 tablet, Rfl: 2 .  cyclobenzaprine (FLEXERIL) 5 MG tablet, TAKE 1 TABLET BY MOUTH EVERY 6 HOURS AS NEEDED, Disp: 30 tablet, Rfl: 2 .  hydrochlorothiazide (HYDRODIURIL) 25 MG tablet, TAKE 1 TABLET BY MOUTH DAILY, Disp: 30 tablet, Rfl: 12 .  ibuprofen (ADVIL,MOTRIN) 200 MG tablet, Take 200 mg by mouth every 6 (six) hours as needed., Disp: , Rfl:  .  Multiple Vitamin (MULTIVITAMIN) capsule, Take 1 capsule by mouth daily., Disp: , Rfl:  .  oxyCODONE-acetaminophen (PERCOCET) 5-325 MG tablet, Take 1 tablet by mouth every 8 (eight) hours as needed for severe pain., Disp: 120 tablet, Rfl: 0 .  sildenafil (REVATIO) 20 MG tablet, 3-5 tablets as needed, no more than 5 in a day, Disp: 50 tablet, Rfl: 0   Patient Care Team: Malva Limesonald E Rider Ermis, MD as PCP - General (Family Medicine)     Objective:   Vitals: BP (!) 140/104 (BP Location: Right Arm, Patient Position: Sitting, Cuff Size: Large)   Pulse 87   Temp 98.3 F (36.8 C) (Oral)    Physical Exam   General Appearance:    Alert, cooperative, no distress, appears stated age  Head:    Normocephalic, without obvious abnormality, atraumatic  Eyes:    PERRL, conjunctiva/corneas clear, EOM's intact, fundi    benign, both eyes       Ears:    Normal TM's and external ear canals, both ears  Nose:   Nares normal,  septum midline, mucosa normal, no drainage   or sinus tenderness  Throat:   Lips, mucosa, and tongue normal; teeth and gums normal  Neck:   Supple, symmetrical, trachea midline, no adenopathy;       thyroid:  No enlargement/tenderness/nodules; no carotid   bruit or JVD  Back:     Symmetric, no curvature, ROM normal, no CVA tenderness  Lungs:     Clear to auscultation bilaterally, respirations unlabored  Chest wall:    Discolored bruised area above left areola. Mild swelling and tenderness right lower and lateral chest well. Tender along left lateral aspect of neck, but no visible deformity.   Heart:    Regular rate and rhythm, S1 and S2 normal, III/VI systolic murmur LUSB and apex. No gallops  Abdomen:     Soft, non-tender, bowel sounds active all four quadrants,    no masses, no organomegaly  Genitalia:    deferred  Rectal:    deferred  Extremities:   Extremities normal, atraumatic, no cyanosis or edema  Pulses:   2+ and symmetric all extremities  Skin:   Skin color, texture, turgor normal, no rashes or lesions  Lymph nodes:   Cervical, supraclavicular, and axillary nodes normal  Neurologic:   CNII-XII intact. Normal strength, sensation and reflexes      throughout    Depression Screen PHQ 2/9 Scores 12/13/2015 12/12/2014  PHQ - 2 Score 2 0  PHQ- 9 Score 7 0      Assessment & Plan:     Routine Health Maintenance and Physical Exam  Exercise Activities and Dietary recommendations Goals    None      Immunization History  Administered Date(s) Administered  . Influenza,inj,Quad PF,36+ Mos 12/12/2014  . Pneumococcal Polysaccharide-23 01/21/2009  . Tdap 07/16/2007    Health Maintenance  Topic Date Due  . HIV Screening  07/25/1974  . INFLUENZA VACCINE  09/19/2015  . COLONOSCOPY  09/01/2016  . TETANUS/TDAP  07/15/2017  . Hepatitis C Screening  Completed      Discussed health benefits of physical activity, and encouraged him to engage in regular exercise appropriate  for his age and condition.    --------------------------------------------------------------------  1. Annual physical exam  - Comprehensive metabolic panel - PSA  2. Essential hypertension Well controlled.  Continue current medications.   - Comprehensive metabolic panel  3. Anxiety disorder, unspecified type Continue prn alprazolam   4. Lumbar spondylosis with myelopathy Doing well with occasional oxycodone - oxyCODONE-acetaminophen (PERCOCET) 5-325 MG tablet; Take 1 tablet by mouth every 8 (eight) hours as needed for severe pain.  Dispense: 120 tablet; Refill: 0  5. Hyperglycemia  - Hemoglobin A1c  6. Hyperlipidemia, unspecified hyperlipidemia type He is tolerating lovastatin well with no adverse effects.   - Lipid panel  7. Need for influenza vaccination  - Flu Vaccine QUAD 36+ mos IM  8. Multiple chest wall contusions.  Healing normally.    The entirety of the information documented in the History of Present Illness, Review of Systems and Physical Exam were personally obtained by me. Portions of this information were initially documented by April M. Hyacinth Meeker, CMA and reviewed by me for thoroughness and accuracy.    Mila Merry, MD  [facility]

## 2015-12-14 LAB — COMPREHENSIVE METABOLIC PANEL
A/G RATIO: 2 (ref 1.2–2.2)
ALK PHOS: 90 IU/L (ref 39–117)
ALT: 32 IU/L (ref 0–44)
AST: 30 IU/L (ref 0–40)
Albumin: 5.1 g/dL (ref 3.5–5.5)
BUN/Creatinine Ratio: 24 — ABNORMAL HIGH (ref 9–20)
BUN: 22 mg/dL (ref 6–24)
Bilirubin Total: 1 mg/dL (ref 0.0–1.2)
CALCIUM: 10.4 mg/dL — AB (ref 8.7–10.2)
CO2: 27 mmol/L (ref 18–29)
Chloride: 96 mmol/L (ref 96–106)
Creatinine, Ser: 0.93 mg/dL (ref 0.76–1.27)
GFR calc Af Amer: 106 mL/min/{1.73_m2} (ref >59)
GFR calc non Af Amer: 91 mL/min/{1.73_m2} (ref >59)
GLOBULIN, TOTAL: 2.5 g/dL (ref 1.5–4.5)
Glucose: 138 mg/dL — ABNORMAL HIGH (ref 65–99)
POTASSIUM: 4.7 mmol/L (ref 3.5–5.2)
SODIUM: 140 mmol/L (ref 134–144)
Total Protein: 7.6 g/dL (ref 6.0–8.5)

## 2015-12-14 LAB — LIPID PANEL
CHOL/HDL RATIO: 4.9 ratio (ref 0.0–5.0)
CHOLESTEROL TOTAL: 219 mg/dL — AB (ref 100–199)
HDL: 45 mg/dL (ref >39)
LDL CALC: 147 mg/dL — AB (ref 0–99)
TRIGLYCERIDES: 136 mg/dL (ref 0–149)
VLDL CHOLESTEROL CAL: 27 mg/dL (ref 5–40)

## 2015-12-14 LAB — HEMOGLOBIN A1C
Est. average glucose Bld gHb Est-mCnc: 120 mg/dL
Hgb A1c MFr Bld: 5.8 % — ABNORMAL HIGH (ref 4.8–5.6)

## 2015-12-14 LAB — PSA: PROSTATE SPECIFIC AG, SERUM: 1.5 ng/mL (ref 0.0–4.0)

## 2016-03-01 ENCOUNTER — Other Ambulatory Visit: Payer: Self-pay | Admitting: Family Medicine

## 2016-03-01 NOTE — Telephone Encounter (Signed)
Please call in alprazolam.  

## 2016-05-03 ENCOUNTER — Other Ambulatory Visit: Payer: Self-pay | Admitting: Family Medicine

## 2016-05-03 NOTE — Telephone Encounter (Signed)
RX called in at [facility] pharmacy  

## 2016-05-03 NOTE — Telephone Encounter (Signed)
Please call in alprazolam.  

## 2016-07-06 ENCOUNTER — Other Ambulatory Visit: Payer: Self-pay | Admitting: Family Medicine

## 2016-07-06 NOTE — Telephone Encounter (Signed)
Please call in alprazolam.  

## 2016-07-08 NOTE — Telephone Encounter (Signed)
Rx called in to pharmacy. 

## 2016-08-05 ENCOUNTER — Other Ambulatory Visit: Payer: Self-pay | Admitting: Family Medicine

## 2016-08-05 DIAGNOSIS — M545 Low back pain, unspecified: Secondary | ICD-10-CM

## 2016-08-13 LAB — LIPID PANEL
Cholesterol: 129 (ref 0–200)
HDL: 49 (ref 35–70)
LDL Cholesterol: 63
TRIGLYCERIDES: 81 (ref 40–160)

## 2016-08-13 LAB — BASIC METABOLIC PANEL: Glucose: 98

## 2016-09-07 ENCOUNTER — Other Ambulatory Visit: Payer: Self-pay | Admitting: Family Medicine

## 2016-09-07 NOTE — Telephone Encounter (Signed)
Please call in alprazolam.  

## 2016-09-09 NOTE — Telephone Encounter (Signed)
Rx called in to pharmacy. 

## 2016-12-10 ENCOUNTER — Other Ambulatory Visit: Payer: Self-pay | Admitting: Family Medicine

## 2016-12-10 NOTE — Telephone Encounter (Signed)
Please call in alprazolam.  

## 2016-12-10 NOTE — Telephone Encounter (Signed)
Rx called in to pharmacy. 

## 2016-12-16 ENCOUNTER — Encounter: Payer: BLUE CROSS/BLUE SHIELD | Admitting: Family Medicine

## 2016-12-19 ENCOUNTER — Encounter: Payer: Self-pay | Admitting: Family Medicine

## 2016-12-19 ENCOUNTER — Ambulatory Visit (INDEPENDENT_AMBULATORY_CARE_PROVIDER_SITE_OTHER): Payer: BLUE CROSS/BLUE SHIELD | Admitting: Family Medicine

## 2016-12-19 VITALS — BP 120/80 | HR 70 | Temp 98.8°F | Resp 16 | Ht 72.0 in | Wt 161.0 lb

## 2016-12-19 DIAGNOSIS — Z125 Encounter for screening for malignant neoplasm of prostate: Secondary | ICD-10-CM

## 2016-12-19 DIAGNOSIS — M4716 Other spondylosis with myelopathy, lumbar region: Secondary | ICD-10-CM

## 2016-12-19 DIAGNOSIS — I050 Rheumatic mitral stenosis: Secondary | ICD-10-CM

## 2016-12-19 DIAGNOSIS — I10 Essential (primary) hypertension: Secondary | ICD-10-CM

## 2016-12-19 DIAGNOSIS — Z0000 Encounter for general adult medical examination without abnormal findings: Secondary | ICD-10-CM

## 2016-12-19 DIAGNOSIS — F419 Anxiety disorder, unspecified: Secondary | ICD-10-CM

## 2016-12-19 DIAGNOSIS — Z86010 Personal history of colonic polyps: Secondary | ICD-10-CM

## 2016-12-19 DIAGNOSIS — E785 Hyperlipidemia, unspecified: Secondary | ICD-10-CM

## 2016-12-19 LAB — PSA: PSA: 1.7 ng/mL (ref <=4.0)

## 2016-12-19 NOTE — Progress Notes (Addendum)
Patient: Samuel Woods, Male    DOB: [birth year], 56 y.o.   MRN: [id number] Visit Date: 12/19/2016  Today's Provider: Mila Merry, MD   Chief Complaint  Patient presents with  . Annual Exam  . Hypertension  . Anxiety  . Hyperglycemia  . Hyperlipidemia   Subjective:    Annual physical exam Samuel Woods is a 56 y.o. male who presents today for health maintenance and complete physical. He feels well. He reports exercising daily. He reports he is sleeping fairly well.  ----------------------------------------------------------------- Wt Readings from Last 3 Encounters:  12/19/16 161 lb (73 kg)  12/13/15 174 lb (78.9 kg)  06/13/15 182 lb (82.6 kg)   Has started working at [employer] in [place]. Is walking all day long at work and eating w   Hypertension, follow-up:  BP Readings from Last 3 Encounters:  12/13/15 (!) 140/104  06/13/15 140/88  04/15/15 122/82    He was last seen for hypertension 1 years ago.  BP at that visit was 140/104. Management since that visit includes; no changes.He reports good compliance with treatment. He is not having side effects.  He is exercising. He is adherent to low salt diet.   Outside blood pressures are rarely checked. He is experiencing none.  Patient denies chest pain, chest pressure/discomfort, claudication, dyspnea, exertional chest pressure/discomfort, fatigue, irregular heart beat, lower extremity edema, near-syncope, orthopnea, palpitations, paroxysmal nocturnal dyspnea, syncope and tachypnea.   Cardiovascular risk factors include dyslipidemia, hypertension and male gender.  Use of agents associated with hypertension: NSAIDS.   ------------------------------------------------------------------------    Lipid/Cholesterol, Follow-up:   Last seen for this 1 years ago.  Management since that visit includes; labs checked, no changes.  He had lipids check with work a few months with TC=129 and LDL=63. Is doing well with  atorvastatin.   Last Lipid Panel:    Component Value Date/Time   CHOL 219 (H) 12/13/2015 1011   TRIG 136 12/13/2015 1011   HDL 45 12/13/2015 1011   CHOLHDL 4.9 12/13/2015 1011   LDLCALC 147 (H) 12/13/2015 1011    He reports good compliance with treatment. He is not having side effects.   Wt Readings from Last 3 Encounters:  12/13/15 174 lb (78.9 kg)  06/13/15 182 lb (82.6 kg)  04/15/15 182 lb 6.4 oz (82.7 kg)    ------------------------------------------------------------------------  Anxiety disorder, unspecified type From 12/13/2015-Continue prn alprazolam.  Lumbar spondylosis with myelopathy From 12/13/2015-no changes. Refilled oxyCODONE-acetaminophen (PERCOCET) 5-325 MG tablet  Hyperglycemia From 12/13/2015-Hemoglobin A1c 5.8. No changes.  Review of Systems  Constitutional: Negative for appetite change, chills, fatigue and fever.  HENT: Negative for congestion, ear pain, hearing loss, nosebleeds and trouble swallowing.   Eyes: Negative for pain and visual disturbance.  Respiratory: Negative for cough, chest tightness and shortness of breath.   Cardiovascular: Negative for chest pain, palpitations and leg swelling.  Gastrointestinal: Negative for abdominal pain, blood in stool, constipation, diarrhea, nausea and vomiting.  Endocrine: Negative for polydipsia, polyphagia and polyuria.  Genitourinary: Negative for dysuria and flank pain.  Musculoskeletal: Positive for back pain. Negative for arthralgias, joint swelling, myalgias and neck stiffness.  Skin: Negative for color change, rash and wound.  Neurological: Negative for dizziness, tremors, seizures, speech difficulty, weakness, light-headedness and headaches.  Psychiatric/Behavioral: Negative for behavioral problems, confusion, decreased concentration, dysphoric mood and sleep disturbance. The patient is not nervous/anxious.   All other systems reviewed and are negative.   Social History      He  reports  that  he quit smoking about 10 years ago. He has a 7.50 pack-year smoking history. He does not have any smokeless tobacco history on file. He reports that he drinks alcohol. He reports that he does not use drugs.       Social History   Social History  . Marital status: Married    Spouse name: N/A  . Number of children: 2  . Years of education: N/A   Occupational History  . Seasonal outside power equipment [employer]   Social History Main Topics  . Smoking status: Current Some Day Smoker    Packs/day: 0.50    Years: 15.00    Last attempt to quit: 02/18/2006  . Smokeless tobacco: Never Used     Comment: smokes 2 cigarettes weekly; started smoking at age 11   . Alcohol use 0.0 oz/week     Comment: occasional use  . Drug use: No  . Sexual activity: Not Asked   Other Topics Concern  . None   Social History Narrative  . None    Past Medical History:  Diagnosis Date  . History of chicken pox   . History of lung abscess 01/18/2009  . History of measles      Patient Active Problem List   Diagnosis Date Noted  . Hypertension 12/09/2014  . Obesity 12/09/2014  . Prediabetes 12/09/2014  . H/O adenomatous polyp of colon 09/02/2011  . Spondylosis with myelopathy 06/09/2009  . Lumbago 12/20/2008  . Mitral stenosis 08/12/2007  . History of tobacco use 10/14/2006  . Anxiety disorder 12/13/2005  . Impotence of organic origin 12/01/2005  . HLD (hyperlipidemia) 11/22/2005    Past Surgical History:  Procedure Laterality Date  . DOPPLER ECHOCARDIOGRAPHY  08/12/2007   Mild to moderate stenosis. Trace TR.Mild pulmonary hypertension. Borderline LVH. Mild depressed right systolic function. LVEF=50-55%  . MITRAL VALVE REPAIR  05/2002  . SPINAL FUSION      Family History        Family Status  Relation Status  . Mother Alive  . Father Deceased  . Sister Alive  . Brother Alive  . Sister Alive  . Other Alive        His family history includes Alcohol abuse in his father;  Cancer in his other; Hypertension in his brother.     No Known Allergies   Current Outpatient Prescriptions:  .  ALPRAZolam (XANAX) 1 MG tablet, TAKE 1/2-1 TAB BY MOUTH 3 TIMES DAILY AS NEEDED, Disp: 90 tablet, Rfl: 2 .  aspirin EC 81 MG tablet, Take 81 mg by mouth once. PT states that he takes which every aspirin is the cheapest. Sometimes its the 81mg  and sometimes 325mg ., Disp: , Rfl:  .  atorvastatin (LIPITOR) 20 MG tablet, TAKE ONE TABLET BY MOUTH ONCE DAILY, Disp: 90 tablet, Rfl: 3 .  cyclobenzaprine (FLEXERIL) 5 MG tablet, TAKE ONE TABLET BY MOUTH EVERY 6 HOURS AS NEEDED, Disp: 30 tablet, Rfl: 4 .  hydrochlorothiazide (HYDRODIURIL) 25 MG tablet, TAKE ONE TABLET BY MOUTH ONCE DAILY, Disp: 30 tablet, Rfl: 6 .  ibuprofen (ADVIL,MOTRIN) 200 MG tablet, Take 200 mg by mouth every 6 (six) hours as needed., Disp: , Rfl:  .  Multiple Vitamin (MULTIVITAMIN) capsule, Take 1 capsule by mouth daily., Disp: , Rfl:  .  oxyCODONE-acetaminophen (PERCOCET) 5-325 MG tablet, Take 1 tablet by mouth every 8 (eight) hours as needed for severe pain., Disp: 120 tablet, Rfl: 0   Patient Care Team: Malva Limes, MD as PCP -  General (Family Medicine)      Objective:   Vitals: BP 120/80 (BP Location: Left Arm, Patient Position: Sitting, Cuff Size: Normal)   Pulse 70   Temp 98.8 F (37.1 C) (Oral)   Resp 16   Ht 6' (1.829 m)   Wt 161 lb (73 kg)   SpO2 98% Comment: room air  BMI 21.84 kg/m      Physical Exam   General Appearance:    Alert, cooperative, no distress, appears stated age  Head:    Normocephalic, without obvious abnormality, atraumatic  Eyes:    PERRL, conjunctiva/corneas clear, EOM's intact, fundi    benign, both eyes       Ears:    Normal TM's and external ear canals, both ears  Nose:   Nares normal, septum midline, mucosa normal, no drainage   or sinus tenderness  Throat:   Lips, mucosa, and tongue normal; teeth and gums normal  Neck:   Supple, symmetrical, trachea midline, no  adenopathy;       thyroid:  No enlargement/tenderness/nodules; no carotid   bruit or JVD  Back:     Symmetric, no curvature, ROM normal, no CVA tenderness  Lungs:     Clear to auscultation bilaterally, respirations unlabored  Chest wall:    No tenderness or deformity  Heart:    Regular rate and rhythm, S1 and S2 normal, III/VI systolic murmur LUSB  Abdomen:     Soft, non-tender, bowel sounds active all four quadrants,    no masses, no organomegaly  Genitalia:    deferred  Rectal:    deferred  Extremities:   Extremities normal, atraumatic, no cyanosis or edema  Pulses:   2+ and symmetric all extremities  Skin:   Skin color, texture, turgor normal, no rashes or lesions  Lymph nodes:   Cervical, supraclavicular, and axillary nodes normal  Neurologic:   CNII-XII intact. Normal strength, sensation and reflexes      throughout    Depression Screen PHQ 2/9 Scores 12/19/2016 12/13/2015 12/12/2014  PHQ - 2 Score 1 2 0  PHQ- 9 Score 1 7 0      Assessment & Plan:     Routine Health Maintenance and Physical Exam  Exercise Activities and Dietary recommendations Goals    None      Immunization History  Administered Date(s) Administered  . Influenza,inj,Quad PF,6+ Mos 12/12/2014, 12/13/2015  . Pneumococcal Polysaccharide-23 01/21/2009  . Tdap 07/16/2007    Health Maintenance  Topic Date Due  . HIV Screening  07/25/1974  . COLONOSCOPY  09/01/2016  . INFLUENZA VACCINE  09/18/2016  . TETANUS/TDAP  07/15/2017  . Hepatitis C Screening  Completed     Discussed health benefits of physical activity, and encouraged him to engage in regular exercise appropriate for his age and condition.    --------------------------------------------------------------------  1. Annual physical exam Doing well. Getting much more exercise with job change. No complains today with unremarkable exam.   2. Essential hypertension Well controlled. .ccm   3. Anxiety disorder, unspecified type Improved  since he started working again.   4. Hyperlipidemia, unspecified hyperlipidemia type He is tolerating atorvastatin well with no adverse effects.    5. H/O adenomatous polyp of colon Due for follow up colonoscopy  - Ambulatory referral to Gastroenterology  6. Mitral valve stenosis, unspecified etiology Asymptomatic. Compliant with medication.  Continue aggressive risk factor modification.    7. Osteoarthritis of lumbar spine with myelopathy Taking 2 ibuprofen up to 5 times on some days which remains  effective with rare use of oxycodone/apap. Advised we could change to a Cox-2 inhibitor is he requires escalating or daily use of ibuprofen.   8. Prostate cancer screening  - PSA    Mila Merryonald Fisher, MD  [facility]

## 2016-12-30 ENCOUNTER — Other Ambulatory Visit: Payer: Self-pay | Admitting: Family Medicine

## 2016-12-30 DIAGNOSIS — M4716 Other spondylosis with myelopathy, lumbar region: Secondary | ICD-10-CM

## 2016-12-30 MED ORDER — HYDROCHLOROTHIAZIDE 25 MG PO TABS: 25.0000 mg | ORAL_TABLET | Freq: Every day | ORAL | 3 refills | Status: DC

## 2016-12-30 NOTE — Telephone Encounter (Signed)
[facility] faxed a request for a 90-days supply for the following medication. Thanks CC ° °hydrochlorothiazide (HYDRODIURIL) 25 MG tablet  ° °

## 2016-12-31 ENCOUNTER — Other Ambulatory Visit: Payer: Self-pay | Admitting: Family Medicine

## 2016-12-31 DIAGNOSIS — M545 Low back pain, unspecified: Secondary | ICD-10-CM

## 2016-12-31 MED ORDER — ATORVASTATIN CALCIUM 20 MG PO TABS: 20.0000 mg | ORAL_TABLET | Freq: Every day | ORAL | 44 refills | Status: DC

## 2016-12-31 MED ORDER — CYCLOBENZAPRINE HCL 5 MG PO TABS: 5.0000 mg | ORAL_TABLET | Freq: Four times a day (QID) | ORAL | 4 refills | Status: DC | PRN

## 2016-12-31 NOTE — Telephone Encounter (Signed)
[facility] a request for the following medications. Thanks CC  atorvastatin (LIPITOR) 20 MG tablet   cyclobenzaprine (FLEXERIL) 5 MG tablet

## 2017-01-02 ENCOUNTER — Other Ambulatory Visit: Payer: Self-pay | Admitting: Family Medicine

## 2017-01-02 MED ORDER — ALPRAZOLAM 1 MG PO TABS: ORAL_TABLET | ORAL | 2 refills | Status: DC

## 2017-01-02 MED ORDER — HYDROCHLOROTHIAZIDE 25 MG PO TABS: 25.0000 mg | ORAL_TABLET | Freq: Every day | ORAL | 3 refills | Status: DC

## 2017-01-02 NOTE — Telephone Encounter (Signed)
[facility] faxed a request for the following medications. Thanks CC  hydrochlorothiazide (HYDRODIURIL) 25 MG tablet   ALPRAZolam (XANAX) 1 MG tablet

## 2017-01-02 NOTE — Telephone Encounter (Signed)
Please call in alprazolam.  

## 2017-01-02 NOTE — Telephone Encounter (Signed)
Change of pharmacy

## 2017-01-02 NOTE — Telephone Encounter (Signed)
Rx called in to pharmacy. 

## 2017-02-28 ENCOUNTER — Encounter: Payer: Self-pay | Admitting: *Deleted

## 2017-02-28 ENCOUNTER — Telehealth: Payer: Self-pay | Admitting: Family Medicine

## 2017-02-28 NOTE — Telephone Encounter (Signed)
na

## 2017-02-28 NOTE — Telephone Encounter (Signed)
Please advise patient GI is trying to contact him to schedule colonoscopy. Can give him contact information to call and schedule, or order Cologuard if he prefers.

## 2017-03-03 NOTE — Telephone Encounter (Signed)
LMOVM for pt to return call 

## 2017-03-06 NOTE — Telephone Encounter (Signed)
LMOVM for pt to return call 

## 2017-03-17 NOTE — Telephone Encounter (Signed)
Unable to contact the patient. A letter was sent via GI office to call and contact them.

## 2017-05-02 ENCOUNTER — Other Ambulatory Visit: Payer: Self-pay | Admitting: Family Medicine

## 2017-06-06 ENCOUNTER — Other Ambulatory Visit: Payer: Self-pay | Admitting: Family Medicine

## 2017-06-06 DIAGNOSIS — M545 Low back pain, unspecified: Secondary | ICD-10-CM

## 2017-07-06 ENCOUNTER — Observation Stay
Admission: EM | Admit: 2017-07-06 | Discharge: 2017-07-07 | Disposition: A | Discharge status: Home / Self Care | Payer: BLUE CROSS/BLUE SHIELD | Attending: Internal Medicine | Admitting: Internal Medicine

## 2017-07-06 ENCOUNTER — Emergency Department: Payer: BLUE CROSS/BLUE SHIELD

## 2017-07-06 ENCOUNTER — Other Ambulatory Visit: Payer: Self-pay

## 2017-07-06 DIAGNOSIS — Z7982 Long term (current) use of aspirin: Secondary | ICD-10-CM | POA: Insufficient documentation

## 2017-07-06 DIAGNOSIS — R0602 Shortness of breath: Secondary | ICD-10-CM | POA: Diagnosis not present

## 2017-07-06 DIAGNOSIS — E785 Hyperlipidemia, unspecified: Secondary | ICD-10-CM | POA: Insufficient documentation

## 2017-07-06 DIAGNOSIS — F17210 Nicotine dependence, cigarettes, uncomplicated: Secondary | ICD-10-CM | POA: Diagnosis not present

## 2017-07-06 DIAGNOSIS — Z809 Family history of malignant neoplasm, unspecified: Secondary | ICD-10-CM | POA: Insufficient documentation

## 2017-07-06 DIAGNOSIS — R42 Dizziness and giddiness: Secondary | ICD-10-CM | POA: Diagnosis not present

## 2017-07-06 DIAGNOSIS — Z952 Presence of prosthetic heart valve: Secondary | ICD-10-CM | POA: Insufficient documentation

## 2017-07-06 DIAGNOSIS — F419 Anxiety disorder, unspecified: Secondary | ICD-10-CM | POA: Diagnosis not present

## 2017-07-06 DIAGNOSIS — I050 Rheumatic mitral stenosis: Secondary | ICD-10-CM | POA: Insufficient documentation

## 2017-07-06 DIAGNOSIS — I272 Pulmonary hypertension, unspecified: Secondary | ICD-10-CM | POA: Diagnosis not present

## 2017-07-06 DIAGNOSIS — E7800 Pure hypercholesterolemia, unspecified: Secondary | ICD-10-CM | POA: Insufficient documentation

## 2017-07-06 DIAGNOSIS — R7303 Prediabetes: Secondary | ICD-10-CM | POA: Diagnosis not present

## 2017-07-06 DIAGNOSIS — Z8249 Family history of ischemic heart disease and other diseases of the circulatory system: Secondary | ICD-10-CM | POA: Insufficient documentation

## 2017-07-06 DIAGNOSIS — J90 Pleural effusion, not elsewhere classified: Secondary | ICD-10-CM | POA: Insufficient documentation

## 2017-07-06 DIAGNOSIS — Z981 Arthrodesis status: Secondary | ICD-10-CM | POA: Diagnosis not present

## 2017-07-06 DIAGNOSIS — Z79899 Other long term (current) drug therapy: Secondary | ICD-10-CM | POA: Diagnosis not present

## 2017-07-06 DIAGNOSIS — Z811 Family history of alcohol abuse and dependence: Secondary | ICD-10-CM | POA: Diagnosis not present

## 2017-07-06 DIAGNOSIS — I4891 Unspecified atrial fibrillation: Principal | ICD-10-CM | POA: Diagnosis present

## 2017-07-06 DIAGNOSIS — I10 Essential (primary) hypertension: Secondary | ICD-10-CM | POA: Diagnosis not present

## 2017-07-06 DIAGNOSIS — R0789 Other chest pain: Secondary | ICD-10-CM | POA: Diagnosis not present

## 2017-07-06 LAB — BASIC METABOLIC PANEL
Anion gap: 7 (ref 5–15)
BUN: 31 mg/dL — ABNORMAL HIGH (ref 6–20)
CALCIUM: 9 mg/dL (ref 8.9–10.3)
CHLORIDE: 107 mmol/L (ref 101–111)
CO2: 23 mmol/L (ref 22–32)
Creatinine, Ser: 0.93 mg/dL (ref 0.61–1.24)
GFR calc Af Amer: >60 mL/min (ref >60)
GLUCOSE: 179 mg/dL — AB (ref 65–99)
POTASSIUM: 3.9 mmol/L (ref 3.5–5.1)
SODIUM: 137 mmol/L (ref 135–145)

## 2017-07-06 LAB — CBC
HEMATOCRIT: 44.9 % (ref 40.0–52.0)
HEMATOCRIT: 49.6 % (ref 40.0–52.0)
HEMOGLOBIN: 15.2 g/dL (ref 13.0–18.0)
HEMOGLOBIN: 16.6 g/dL (ref 13.0–18.0)
MCH: 31.1 pg (ref 26.0–34.0)
MCH: 31.2 pg (ref 26.0–34.0)
MCHC: 33.8 g/dL (ref 32.0–36.0)
MCHC: 33.4 g/dL (ref 32.0–36.0)
MCV: 92 fL (ref 80.0–100.0)
MCV: 93.2 fL (ref 80.0–100.0)
Platelets: 223 10*3/uL (ref 150–440)
Platelets: 226 10*3/uL (ref 150–440)
RBC: 4.88 MIL/uL (ref 4.40–5.90)
RBC: 5.32 MIL/uL (ref 4.40–5.90)
RDW: 14.5 % (ref 11.5–14.5)
RDW: 14.4 % (ref 11.5–14.5)
WBC: 9.4 10*3/uL (ref 3.8–10.6)
WBC: 9.4 10*3/uL (ref 3.8–10.6)

## 2017-07-06 LAB — BRAIN NATRIURETIC PEPTIDE: B NATRIURETIC PEPTIDE 5: 625 pg/mL — AB (ref 0.0–100.0)

## 2017-07-06 LAB — TSH: TSH: 1.983 u[IU]/mL (ref 0.350–4.500)

## 2017-07-06 LAB — TROPONIN I
TROPONIN I: 0.05 ng/mL — AB (ref <0.03)
Troponin I: 0.06 ng/mL (ref <0.03)

## 2017-07-06 LAB — CREATININE, SERUM
Creatinine, Ser: 0.93 mg/dL (ref 0.61–1.24)
GFR calc Af Amer: >60 mL/min (ref >60)

## 2017-07-06 LAB — PROTIME-INR
INR: 1.17
Prothrombin Time: 14.8 seconds (ref 11.4–15.2)

## 2017-07-06 MED ORDER — ENOXAPARIN SODIUM 40 MG/0.4ML ~~LOC~~ SOLN
40.0000 mg | SUBCUTANEOUS | Status: DC
  Filled 2017-07-06: qty 0.4

## 2017-07-06 MED ORDER — ONDANSETRON HCL 4 MG/2ML IJ SOLN
4.0000 mg | Freq: Four times a day (QID) | INTRAMUSCULAR | Status: DC | PRN
Start: 2017-07-06 — End: 2017-07-07

## 2017-07-06 MED ORDER — ACETAMINOPHEN 325 MG PO TABS: 650.0000 mg | ORAL_TABLET | Freq: Four times a day (QID) | ORAL | Status: DC | PRN

## 2017-07-06 MED ORDER — METOPROLOL TARTRATE 50 MG PO TABS
50.0000 mg | ORAL_TABLET | Freq: Two times a day (BID) | ORAL | Status: DC
  Filled 2017-07-06: qty 1

## 2017-07-06 MED ORDER — ASPIRIN 325 MG PO TABS
325.0000 mg | ORAL_TABLET | Freq: Every day | ORAL | Status: DC
  Filled 2017-07-06 (×2): qty 1

## 2017-07-06 MED ORDER — ADULT MULTIVITAMIN W/MINERALS CH
1.0000 | ORAL_TABLET | Freq: Every day | ORAL | Status: DC
  Filled 2017-07-06 (×2): qty 1

## 2017-07-06 MED ORDER — METOPROLOL TARTRATE 5 MG/5ML IV SOLN
5.0000 mg | Freq: Once | INTRAVENOUS | Status: AC
  Administered 2017-07-06: 5 mg via INTRAVENOUS
  Filled 2017-07-06: qty 5

## 2017-07-06 MED ORDER — SODIUM CHLORIDE 0.9 % IV SOLN: 250.0000 mL | INTRAVENOUS | Status: DC | PRN

## 2017-07-06 MED ORDER — ONDANSETRON HCL 4 MG PO TABS: 4.0000 mg | ORAL_TABLET | Freq: Four times a day (QID) | ORAL | Status: DC | PRN

## 2017-07-06 MED ORDER — SODIUM CHLORIDE 0.9% FLUSH
3.0000 mL | Freq: Two times a day (BID) | INTRAVENOUS | Status: DC
  Administered 2017-07-06 – 2017-07-07 (×2): 3 mL via INTRAVENOUS

## 2017-07-06 MED ORDER — METOPROLOL TARTRATE 25 MG PO TABS
25.0000 mg | ORAL_TABLET | Freq: Once | ORAL | Status: AC
  Administered 2017-07-06: 25 mg via ORAL

## 2017-07-06 MED ORDER — ACETAMINOPHEN 650 MG RE SUPP: 650.0000 mg | Freq: Four times a day (QID) | RECTAL | Status: DC | PRN

## 2017-07-06 MED ORDER — ALPRAZOLAM 1 MG PO TABS: 1.0000 mg | ORAL_TABLET | Freq: Three times a day (TID) | ORAL | Status: DC | PRN

## 2017-07-06 MED ORDER — ATORVASTATIN CALCIUM 20 MG PO TABS
20.0000 mg | ORAL_TABLET | Freq: Every day | ORAL | Status: DC
  Administered 2017-07-06: 20 mg via ORAL
  Filled 2017-07-06 (×2): qty 1

## 2017-07-06 MED ORDER — SODIUM CHLORIDE 0.9% FLUSH: 3.0000 mL | INTRAVENOUS | Status: DC | PRN

## 2017-07-06 MED ORDER — METOPROLOL TARTRATE 5 MG/5ML IV SOLN: 10.0000 mg | INTRAVENOUS | Status: DC | PRN

## 2017-07-06 MED ORDER — AMIODARONE HCL 200 MG PO TABS
400.0000 mg | ORAL_TABLET | Freq: Two times a day (BID) | ORAL | Status: DC
  Administered 2017-07-06: 400 mg via ORAL
  Filled 2017-07-06: qty 2

## 2017-07-06 MED ORDER — METOPROLOL TARTRATE 25 MG PO TABS
ORAL_TABLET | ORAL | Status: AC
  Filled 2017-07-06: qty 1

## 2017-07-06 NOTE — ED Triage Notes (Signed)
Pt reports SOB since Tuesday night. Reports some chest pain, leg pain, and increased SOB with exertion. Reports cardiac history. Ambulatory to triage.

## 2017-07-06 NOTE — ED Notes (Signed)
Dr Marisa Severin made aware of pt's elevated Troponin as reported by lab at this time: Troponin 0.05ng/mL

## 2017-07-06 NOTE — H&P (Signed)
[facility] [place] [facility]   PATIENT NAME: Samuel Woods    MR#:  [id number]  DATE OF BIRTH:  09-06-1960  DATE OF ADMISSION:  07/06/2017  PRIMARY CARE PHYSICIAN: Malva Limes, MD   REQUESTING/REFERRING PHYSICIAN: Dionne Bucy, MD  CHIEF COMPLAINT:   Chief Complaint  Patient presents with  . Shortness of Breath  . Chest Pain    HISTORY OF PRESENT ILLNESS: Samuel Woods  is a 57 y.o. male with a known history of essential hypertension and hyperlipidemia presents to the ED with complaint of feeling dizziness short of breath for the past few days.  Patient reports that the symptoms usually occurred while he was working.  Today his symptoms got worse therefore his coworker who also is a Teacher, music checked his heart rate and blood pressure noticed blood pressure was very low heart rate was very high.  Patient was sent to the ED was noted to have A. fib with RVR.  He received IV metoprolol heart rate is improved.  He denies any chest pains or palpitations.  No syncope.      PAST MEDICAL HISTORY:   Past Medical History:  Diagnosis Date  . History of chicken pox   . History of lung abscess 01/18/2009  . History of measles   . Hyperlipemia   . Hypertension     PAST SURGICAL HISTORY:  Past Surgical History:  Procedure Laterality Date  . DOPPLER ECHOCARDIOGRAPHY  08/12/2007   Mild to moderate stenosis. Trace TR.Mild pulmonary hypertension. Borderline LVH. Mild depressed right systolic function. LVEF=50-55%  . MITRAL VALVE REPAIR  05/2002  . SPINAL FUSION      SOCIAL HISTORY:  Social History   Tobacco Use  . Smoking status: Current Some Day Smoker    Packs/day: 0.50    Years: 15.00    Pack years: 7.50    Last attempt to quit: 02/18/2006    Years since quitting: 11.3  . Smokeless tobacco: Never Used  . Tobacco comment: smokes 2 cigarettes weekly; started smoking at age 5   Substance Use Topics  . Alcohol use: Yes    Alcohol/week: 0.0 oz     Comment: occasional use    FAMILY HISTORY:  Family History  Problem Relation Age of Onset  . Alcohol abuse Father   . Hypertension Brother   . Cancer Other   . Diabetes Neg Hx     DRUG ALLERGIES: No Known Allergies  REVIEW OF SYSTEMS:   CONSTITUTIONAL: No fever, positive fatigue or weakness.  EYES: No blurred or double vision.  EARS, NOSE, AND THROAT: No tinnitus or ear pain.  RESPIRATORY: No cough, shortness of breath, wheezing or hemoptysis.  CARDIOVASCULAR: No chest pain, orthopnea, edema.  GASTROINTESTINAL: No nausea, vomiting, diarrhea or abdominal pain.  GENITOURINARY: No dysuria, hematuria.  ENDOCRINE: No polyuria, nocturia,  HEMATOLOGY: No anemia, easy bruising or bleeding SKIN: No rash or lesion. MUSCULOSKELETAL: No joint pain or arthritis.   NEUROLOGIC: No tingling, numbness, weakness.  Positive dizziness PSYCHIATRY: No anxiety or depression.   MEDICATIONS AT HOME:  Prior to Admission medications   Medication Sig Start Date End Date Taking? Authorizing Provider  aspirin EC 81 MG tablet Take 81 mg by mouth once. PT states that he takes which every aspirin is the cheapest. Sometimes its the  and sometimes .   Yes [provider]  atorvastatin (LIPITOR) 20 MG tablet Take 1 tablet (20 mg total) daily by mouth. 12/31/16  Yes Fisher, Demetrios Isaacs, MD  hydrochlorothiazide (HYDRODIURIL) 25 MG tablet Take 1 tablet (25 mg total) daily by mouth. 01/02/17  Yes Fisher, Demetrios Isaacs, MD  ALPRAZolam Prudy Feeler) 1 MG tablet TAKE 1/2 TO  1  TABLET     THREE TIMES   DAILY  AS    NEEDED 05/02/17   Malva Limes, MD  cyclobenzaprine (FLEXERIL) 5 MG tablet TAKE 1 TABLET EVERY 6 HOURSAS NEEDED 06/06/17   Malva Limes, MD  ibuprofen (ADVIL,MOTRIN) 200 MG tablet Take 200 mg by mouth every 6 (six) hours as needed.    [provider]  Multiple Vitamin (MULTIVITAMIN) capsule Take 1 capsule by mouth daily.    [provider]  oxyCODONE-acetaminophen (PERCOCET) 5-325  MG tablet Take 1 tablet by mouth every 8 (eight) hours as needed for severe pain. Patient not taking: Reported on 07/06/2017 12/13/15   Malva Limes, MD      PHYSICAL EXAMINATION:   VITAL SIGNS: Blood pressure (!) 127/99, pulse 97, temperature 98.9 F (37.2 C), temperature source Oral, resp. rate 18, height 6' (1.829 m), weight 73.5 kg (162 lb), SpO2 100 %.  GENERAL:  57 y.o.-year-old patient lying in the bed with no acute distress.  EYES: Pupils equal, round, reactive to light and accommodation. No scleral icterus. Extraocular muscles intact.  HEENT: Head atraumatic, normocephalic. Oropharynx and nasopharynx clear.  NECK:  Supple, no jugular venous distention. No thyroid enlargement, no tenderness.  LUNGS: Normal breath sounds bilaterally, no wheezing, rales,rhonchi or crepitation. No use of accessory muscles of respiration.  CARDIOVASCULAR: irregular, irrgular No murmurs, rubs, or gallops.  ABDOMEN: Soft, nontender, nondistended. Bowel sounds present. No organomegaly or mass.  EXTREMITIES: No pedal edema, cyanosis, or clubbing.  NEUROLOGIC: Cranial nerves II through XII are intact. Muscle strength 5/5 in all extremities. Sensation intact. Gait not checked.  PSYCHIATRIC: The patient is alert and oriented x 3.  SKIN: No obvious rash, lesion, or ulcer.   LABORATORY PANEL:   CBC Recent Labs  Lab 07/06/17 1345  WBC 9.4  HGB 15.2  HCT 44.9  PLT 223  MCV 92.0  MCH 31.1  MCHC 33.8  RDW 14.5   ------------------------------------------------------------------------------------------------------------------  Chemistries  Recent Labs  Lab 07/06/17 1345  NA 137  K 3.9  CL 107  CO2 23  GLUCOSE 179*  BUN 31*  CREATININE 0.93  CALCIUM 9.0   ------------------------------------------------------------------------------------------------------------------ estimated creatinine clearance is 91.1 mL/min (by C-G formula based on SCr of 0.93  mg/dL). ------------------------------------------------------------------------------------------------------------------ No results for input(s): TSH, T4TOTAL, T3FREE, THYROIDAB in the last 72 hours.  Invalid input(s): FREET3   Coagulation profile Recent Labs  Lab 07/06/17 1350  INR 1.17   ------------------------------------------------------------------------------------------------------------------- No results for input(s): DDIMER in the last 72 hours. -------------------------------------------------------------------------------------------------------------------  Cardiac Enzymes Recent Labs  Lab 07/06/17 1345  TROPONINI 0.05*   ------------------------------------------------------------------------------------------------------------------ Invalid input(s): POCBNP  ---------------------------------------------------------------------------------------------------------------  Urinalysis No results found for: COLORURINE, APPEARANCEUR, LABSPEC, PHURINE, GLUCOSEU, HGBUR, BILIRUBINUR, KETONESUR, PROTEINUR, UROBILINOGEN, NITRITE, LEUKOCYTESUR   RADIOLOGY: Dg Chest 2 View  Result Date: 07/06/2017 CLINICAL DATA:  Acute shortness of breath and dizziness for 1 week. EXAM: CHEST - 2 VIEW COMPARISON:  10/06/2014 and prior chest radiographs FINDINGS: Cardiomegaly and median sternotomy again noted with pulmonary vascular congestion. Minimal LEFT LOWER lung scarring noted. Trace bilateral pleural effusions are present. There is no evidence of focal airspace disease, pulmonary edema, suspicious pulmonary nodule/mass, pleural effusion, or pneumothorax. No acute bony abnormalities are identified. Surgical hardware within the LOWER cervical spine noted. IMPRESSION: Cardiomegaly with pulmonary vascular congestion and trace bilateral  pleural effusions. Electronically Signed   By: Harmon Pier M.D.   On: 07/06/2017 14:23    EKG: Orders placed or performed during the hospital encounter of  07/06/17  . EKG 12-Lead  . EKG 12-Lead  . ED EKG within 10 minutes  . ED EKG within 10 minutes    IMPRESSION AND PLAN: Pt is 57 y.o with h/o HTN, and hyperlipidemia with dizziness  1. Afib new onset :  Check TSH, check echo Heart rate currently relatively stable I will start patient on oral metoprolol Full dose aspirin Cardiology evaluation Patient's chads score is 1 full dose anticoagulation per cardiology  2.  Essential hypertension start patient metoprolol, stop HCTZ  3. Hyperlipemia : Continue Lipitor  4.  Elevated troponin which is mildly elevated due to demand ischemia followed troponin level  5.  Nicotine abuse smoking cessation provided patient states that he is only drinking few cigarettes a week.  I recommended he stop smoking completely.  Nicotine patch offered he does not want a nicotine patch 4 minutes spent  All the records are reviewed and case discussed with ED provider. Management plans discussed with the patient, family and they are in agreement.  CODE STATUS:    TOTAL TIME TAKING CARE OF THIS PATIENT:55 minutes.    Auburn Bilberry M.D on 07/06/2017 at 4:01 PM  Between 7am to 6pm - Pager - [phone]  After 6pm go to [email or web address] - password [email or web address]  [facility] Office  [phone]  CC: Primary care physician; Malva Limes, MD

## 2017-07-06 NOTE — ED Provider Notes (Signed)
[facility] Emergency Department Provider Note ____________________________________________   First MD Initiated Contact with Patient 07/06/17 1339     (approximate)  I have reviewed the triage vital signs and the nursing notes.   HISTORY  Chief Complaint Shortness of Breath and Chest Pain    HPI Samuel Woods is a 57 y.o. male with history of hypertension, high cholesterol, and mitral valve repair, who presents with shortness of breath over the last 5 days, acute onset when he was coming down from a ladder, associated with lightheadedness, worse with exertion, and associated with some soreness in his legs which resolves when he rests.  He has no leg swelling.  He denies chest pain, but does report a sensation of his heart beating quickly.  He denies ever having had this before.  Past Medical History:  Diagnosis Date  . History of chicken pox   . History of lung abscess 01/18/2009  . History of measles     Patient Active Problem List   Diagnosis Date Noted  . Hypertension 12/09/2014  . Prediabetes 12/09/2014  . H/O adenomatous polyp of colon 09/02/2011  . Spondylosis with myelopathy 06/09/2009  . Lumbago 12/20/2008  . Mitral stenosis 08/12/2007  . History of tobacco use 10/14/2006  . Anxiety disorder 12/13/2005  . Impotence of organic origin 12/01/2005  . HLD (hyperlipidemia) 11/22/2005    Past Surgical History:  Procedure Laterality Date  . DOPPLER ECHOCARDIOGRAPHY  08/12/2007   Mild to moderate stenosis. Trace TR.Mild pulmonary hypertension. Borderline LVH. Mild depressed right systolic function. LVEF=50-55%  . MITRAL VALVE REPAIR  05/2002  . SPINAL FUSION      Prior to Admission medications   Medication Sig Start Date End Date Taking? Authorizing Provider  aspirin EC 81 MG tablet Take 81 mg by mouth once. PT states that he takes which every aspirin is the cheapest. Sometimes its the  and sometimes .   Yes [provider]    atorvastatin (LIPITOR) 20 MG tablet Take 1 tablet (20 mg total) daily by mouth. 12/31/16  Yes Malva Limes, MD  hydrochlorothiazide (HYDRODIURIL) 25 MG tablet Take 1 tablet (25 mg total) daily by mouth. 01/02/17  Yes Malva Limes, MD  ALPRAZolam Prudy Feeler) 1 MG tablet TAKE 1/2 TO  1  TABLET     THREE TIMES   DAILY  AS    NEEDED 05/02/17   Malva Limes, MD  cyclobenzaprine (FLEXERIL) 5 MG tablet TAKE 1 TABLET EVERY 6 HOURSAS NEEDED 06/06/17   Malva Limes, MD  ibuprofen (ADVIL,MOTRIN) 200 MG tablet Take 200 mg by mouth every 6 (six) hours as needed.    [provider]  Multiple Vitamin (MULTIVITAMIN) capsule Take 1 capsule by mouth daily.    [provider]  oxyCODONE-acetaminophen (PERCOCET) 5-325 MG tablet Take 1 tablet by mouth every 8 (eight) hours as needed for severe pain. Patient not taking: Reported on 07/06/2017 12/13/15   Malva Limes, MD    Allergies Patient has no known allergies.  Family History  Problem Relation Age of Onset  . Alcohol abuse Father   . Hypertension Brother   . Cancer Other   . Diabetes Neg Hx     Social History Social History   Tobacco Use  . Smoking status: Current Some Day Smoker    Packs/day: 0.50    Years: 15.00    Pack years: 7.50    Last attempt to quit: 02/18/2006    Years since quitting: 11.3  .  Smokeless tobacco: Never Used  . Tobacco comment: smokes 2 cigarettes weekly; started smoking at age 7   Substance Use Topics  . Alcohol use: Yes    Alcohol/week: 0.0 oz    Comment: occasional use  . Drug use: No    Review of Systems  Constitutional: No fever.  Positive for generalized weakness. Eyes: No redness. ENT: No sore throat. Cardiovascular: Denies chest pain. Respiratory: Positive for shortness of breath. Gastrointestinal: No vomiting.  Genitourinary: Negative for flank pain.  Musculoskeletal: Negative for back pain. Skin: Negative for rash. Neurological: Negative for  headache.   ____________________________________________   PHYSICAL EXAM:  VITAL SIGNS: ED Triage Vitals  Enc Vitals Group     BP 07/06/17 1334 (!) 141/99     Pulse Rate 07/06/17 1334 (!) 127     Resp 07/06/17 1334 14     Temp 07/06/17 1334 98.9 F (37.2 C)     Temp Source 07/06/17 1334 Oral     SpO2 07/06/17 1334 98 %     Weight 07/06/17 1336 162 lb (73.5 kg)     Height 07/06/17 1336 6' (1.829 m)     Head Circumference --      Peak Flow --      Pain Score 07/06/17 1335 0     Pain Loc --      Pain Edu? --      Excl. in GC? --     Constitutional: Alert and oriented.  Slightly uncomfortable appearing but in no acute distress. Eyes: Conjunctivae are normal.  Head: Atraumatic. Nose: No congestion/rhinnorhea. Mouth/Throat: Mucous membranes are moist.   Neck: Normal range of motion.  Cardiovascular: Tachycardic, irregular rhythm. Grossly normal heart sounds.  Good peripheral circulation. Respiratory: Normal respiratory effort.  No retractions. Lungs CTAB. Gastrointestinal:  No distention.  Musculoskeletal: No lower extremity edema.  Extremities warm and well perfused.  Neurologic:  Normal speech and language. No gross focal neurologic deficits are appreciated.  Skin:  Skin is warm and dry. No rash noted. Psychiatric: Mood and affect are normal. Speech and behavior are normal.  ____________________________________________   LABS (all labs ordered are listed, but only abnormal results are displayed)  Labs Reviewed  BASIC METABOLIC PANEL - Abnormal; Notable for the following components:      Result Value   Glucose, Bld 179 (*)    BUN 31 (*)    All other components within normal limits  TROPONIN I - Abnormal; Notable for the following components:   Troponin I 0.05 (*)    All other components within normal limits  BRAIN NATRIURETIC PEPTIDE - Abnormal; Notable for the following components:   B Natriuretic Peptide 625.0 (*)    All other components within normal limits   CBC  PROTIME-INR   ____________________________________________  EKG  ED ECG REPORT I, Dionne Bucy, the attending physician, personally viewed and interpreted this ECG.  Date: 07/06/2017 EKG Time: 1332 Rate: 134 Rhythm: Atrial fibrillation with RVR and occasional PVCs QRS Axis: normal Intervals: normal ST/T Wave abnormalities: Nonspecific lateral T wave abnormalities Narrative Interpretation: New onset atrial fibrillation when compared with EKG of 10/07/2014  ____________________________________________  RADIOLOGY  CXR: No focal infiltrate.  Bilateral pulmonary vascular congestion.  ____________________________________________   PROCEDURES  Procedure(s) performed: No  Procedures  Critical Care performed: Yes  CRITICAL CARE Performed by: Dionne Bucy   Total critical care time: 30 minutes  Critical care time was exclusive of separately billable procedures and treating other patients.  Critical care was necessary to treat or  prevent imminent or life-threatening deterioration.  Critical care was time spent personally by me on the following activities: development of treatment plan with patient and/or surrogate as well as nursing, discussions with consultants, evaluation of patient's response to treatment, examination of patient, obtaining history from patient or surrogate, ordering and performing treatments and interventions, ordering and review of laboratory studies, ordering and review of radiographic studies, pulse oximetry and re-evaluation of patient's condition.  ____________________________________________   INITIAL IMPRESSION / ASSESSMENT AND PLAN / ED COURSE  Pertinent labs & imaging results that were available during my care of the patient were reviewed by me and considered in my medical decision making (see chart for details).  57 year old male with PMH as noted above presents with acute onset of lightheadedness, generalized weakness,  shortness of breath, and some palpitations over the last 5 days.  He also reports intermittent soreness in bilateral lower extremities, but no sustained pain and no swelling.  I reviewed the past medical records in Epic and confirmed the PMH as noted above.  No recent prior ED visits.  On exam, the patient is tachycardic and his rhythm is somewhat irregular.  His other vital signs are normal.  The remainder of the exam is relatively unremarkable.  EKG shows likely rapid atrial fibrillation with occasional PVCs.  Overall presentation is most consistent with new onset atrial fibrillation, although ACS or new onset CHF are also possible.  I do not suspect PE as patient is not hypoxic, has no shortness of breath, has no leg swelling or tenderness to suggest DVT, and has no specific DVT or PE risk factors.  Plan: IV metoprolol, cardiac monitoring, lab work-up, chest x-ray, and reassess.  Anticipate likely admission for new onset atrial fibrillation.   ----------------------------------------- 3:36 PM on 07/06/2017 -----------------------------------------  Patient's heart rate is now controlled after IV and p.o. metoprolol.  He states he is feeling comfortable at rest.  Chest x-ray shows no significant acute findings.  We will proceed with admission.  I signed the patient out to the hospitalist Dr. Allena Katz.  ____________________________________________   FINAL CLINICAL IMPRESSION(S) / ED DIAGNOSES  Final diagnoses:  Atrial fibrillation, unspecified type (HCC)      NEW MEDICATIONS STARTED DURING THIS VISIT:  New Prescriptions   No medications on file     Note:  This document was prepared using Dragon voice recognition software and may include unintentional dictation errors.    Dionne Bucy, MD 07/06/17 1537

## 2017-07-07 ENCOUNTER — Ambulatory Visit: Payer: BLUE CROSS/BLUE SHIELD | Admitting: Family Medicine

## 2017-07-07 DIAGNOSIS — I509 Heart failure, unspecified: Secondary | ICD-10-CM | POA: Diagnosis not present

## 2017-07-07 DIAGNOSIS — I4891 Unspecified atrial fibrillation: Secondary | ICD-10-CM | POA: Diagnosis not present

## 2017-07-07 LAB — TROPONIN I: TROPONIN I: 0.05 ng/mL — AB (ref <0.03)

## 2017-07-07 MED ORDER — DIGOXIN 125 MCG PO TABS: 125.0000 ug | ORAL_TABLET | Freq: Every day | ORAL | 0 refills | Status: DC

## 2017-07-07 MED ORDER — METOPROLOL TARTRATE 100 MG PO TABS: 100.0000 mg | ORAL_TABLET | Freq: Two times a day (BID) | ORAL | 0 refills | Status: DC

## 2017-07-07 MED ORDER — METOPROLOL TARTRATE 50 MG PO TABS
100.0000 mg | ORAL_TABLET | Freq: Two times a day (BID) | ORAL | Status: DC
  Administered 2017-07-07: 100 mg via ORAL
  Filled 2017-07-07: qty 2

## 2017-07-07 NOTE — Discharge Summary (Signed)
[facility] [place] [facility]   PATIENT NAME: Samuel Woods    MR#:  [id number]  DATE OF BIRTH:  June 17, 1960  DATE OF ADMISSION:  07/06/2017 ADMITTING PHYSICIAN: Auburn Bilberry, MD  DATE OF DISCHARGE: 07/07/2017 11:20 AM  PRIMARY CARE PHYSICIAN: Malva Limes, MD   ADMISSION DIAGNOSIS:  Atrial fibrillation, unspecified type (HCC) [I48.91]  Hypertension Hyperlipidemia  tobacco abuse   DISCHARGE DIAGNOSIS:  Active Problems:   A-fib (HCC) Hypertension  Hyperlipidemia Tobacco abuse   SECONDARY DIAGNOSIS:   Past Medical History:  Diagnosis Date  . History of chicken pox   . History of lung abscess 01/18/2009  . History of measles   . Hyperlipemia   . Hypertension   History of mitral valve replacement   ADMITTING HISTORY Samuel Woods  is a 57 y.o. male with a known history of essential hypertension and hyperlipidemia presents to the ED with complaint of feeling dizziness short of breath for the past few days.  Patient reports that the symptoms usually occurred while he was working.  Today his symptoms got worse therefore his coworker who also is a Teacher, music checked his heart rate and blood pressure noticed blood pressure was very low heart rate was very high.  Patient was sent to the ED was noted to have A. fib with RVR.  He received IV metoprolol heart rate is improved.  He denies any chest pains or palpitations.  No syncope.  HOSPITAL COURSE:  Patient admitted to telemetry.  Cardiology consultation was done by Dr. Welton Flakes.  Patient had mildly elevated troponin secondary to demand ischemia.  His electrolytes were normal.  TSH was within normal range.  Patient was started on increased dose of metoprolol to control heart rate.  Patient's atrial fibrillation appears to be chronic.  Attempts at medical cardioversion would be unsuccessful and hence oral amiodarone was discontinued.  Cardiology advised patient to be started on digoxin and continue metoprolol at  increased dose and follow-up in the outpatient clinic on Thursday at 2 PM in the cardiology clinic.  CONSULTS OBTAINED:  Treatment Team:  Laurier Nancy, MD  DRUG ALLERGIES:  No Known Allergies  DISCHARGE MEDICATIONS:   Allergies as of 07/07/2017   No Known Allergies     Medication List    STOP taking these medications   oxyCODONE-acetaminophen 5-325 MG tablet Commonly known as:  PERCOCET     TAKE these medications   ALPRAZolam 1 MG tablet Commonly known as:  XANAX TAKE 1/2 TO  1  TABLET     THREE TIMES   DAILY  AS    NEEDED   aspirin EC 81 MG tablet Take 81 mg by mouth once. PT states that he takes which every aspirin is the cheapest. Sometimes its the  and sometimes .   atorvastatin 20 MG tablet Commonly known as:  LIPITOR Take 1 tablet (20 mg total) daily by mouth.   cyclobenzaprine 5 MG tablet Commonly known as:  FLEXERIL TAKE 1 TABLET EVERY 6 HOURSAS NEEDED   digoxin 0.125 MG tablet Commonly known as:  LANOXIN Take 1 tablet (125 mcg total) by mouth daily.   hydrochlorothiazide 25 MG tablet Commonly known as:  HYDRODIURIL Take 1 tablet (25 mg total) daily by mouth.   ibuprofen 200 MG tablet Commonly known as:  ADVIL,MOTRIN Take 200 mg by mouth every 6 (six) hours as needed.   metoprolol tartrate 100 MG tablet Commonly known as:  LOPRESSOR Take 1 tablet (100 mg total) by mouth 2 (  two) times daily.   multivitamin capsule Take 1 capsule by mouth daily.       Today  Patient seen and evaluated today No chest pain No palpitations  VITAL SIGNS:  Blood pressure (!) 114/95, pulse 100, temperature 97.7 F (36.5 C), temperature source Oral, resp. rate 17, height 6' (1.829 m), weight 73.5 kg (162 lb), SpO2 99 %.  I/O:    Intake/Output Summary (Last 24 hours) at 07/07/2017 1400 Last data filed at 07/07/2017 1007 Gross per 24 hour  Intake 3 ml  Output 550 ml  Net -547 ml    PHYSICAL EXAMINATION:  Physical Exam  GENERAL:  56 y.o.-year-old  patient lying in the bed with no acute distress.  LUNGS: Normal breath sounds bilaterally, no wheezing, rales,rhonchi or crepitation. No use of accessory muscles of respiration.  CARDIOVASCULAR: S1, S2 normal. No murmurs, rubs, or gallops.  ABDOMEN: Soft, non-tender, non-distended. Bowel sounds present. No organomegaly or mass.  NEUROLOGIC: Moves all 4 extremities. PSYCHIATRIC: The patient is alert and oriented x 3.  SKIN: No obvious rash, lesion, or ulcer.   DATA REVIEW:   CBC Recent Labs  Lab 07/06/17 1705  WBC 9.4  HGB 16.6  HCT 49.6  PLT 226    Chemistries  Recent Labs  Lab 07/06/17 1345 07/06/17 1705  NA 137  --   K 3.9  --   CL 107  --   CO2 23  --   GLUCOSE 179*  --   BUN 31*  --   CREATININE 0.93 0.93  CALCIUM 9.0  --     Cardiac Enzymes Recent Labs  Lab 07/06/17 2343  TROPONINI 0.05*    Microbiology Results  No results found for this or any previous visit.  RADIOLOGY:  Dg Chest 2 View  Result Date: 07/06/2017 CLINICAL DATA:  Acute shortness of breath and dizziness for 1 week. EXAM: CHEST - 2 VIEW COMPARISON:  10/06/2014 and prior chest radiographs FINDINGS: Cardiomegaly and median sternotomy again noted with pulmonary vascular congestion. Minimal LEFT LOWER lung scarring noted. Trace bilateral pleural effusions are present. There is no evidence of focal airspace disease, pulmonary edema, suspicious pulmonary nodule/mass, pleural effusion, or pneumothorax. No acute bony abnormalities are identified. Surgical hardware within the LOWER cervical spine noted. IMPRESSION: Cardiomegaly with pulmonary vascular congestion and trace bilateral pleural effusions. Electronically Signed   By: Harmon Pier M.D.   On: 07/06/2017 14:23    Follow up with PCP in 1 week.  Management plans discussed with the patient, family and they are in agreement.  CODE STATUS: Full code    Code Status Orders  (From admission, onward)        Start     Ordered   07/06/17 1659   Full code  Continuous     07/06/17 1658    Code Status History    This patient has a current code status but no historical code status.    Advance Directive Documentation     Most Recent Value  Type of Advance Directive  Living will, Healthcare Power of Attorney  Pre-existing out of facility DNR order (yellow form or pink MOST form)  -  "MOST" Form in Place?  -      TOTAL TIME TAKING CARE OF THIS PATIENT ON DAY OF DISCHARGE: more than 35 minutes.   Ihor Austin M.D on 07/07/2017 at 2:00 PM  Between 7am to 6pm - Pager - [phone]  After 6pm go to [email or web address] - password [email or web address]  [facility]  Office  [phone]  CC: Primary care physician; Malva Limes, MD  Note: This dictation was prepared with Dragon dictation along with smaller phrase technology. Any transcriptional errors that result from this process are unintentional.

## 2017-07-07 NOTE — Consult Note (Signed)
Samuel Woods is a 57 y.o. male  [id number]  Primary Cardiologist: Adrian Blackwater Reason for Consultation: dyspnea on minimal exertion with atrial fibrillation and rapid ventricular response rate.  HPI: this is a 57 year old white male with history of mitral valve replacement at [facility] in 2000 for presented to the hospital with shortness of breath on minimal exertion and then shortness of breath at rest with associated palpitation but no chest pain.   Review of Systems: no chest pain   Past Medical History:  Diagnosis Date  . History of chicken pox   . History of lung abscess 01/18/2009  . History of measles   . Hyperlipemia   . Hypertension     Medications Prior to Admission  Medication Sig Dispense Refill  . aspirin EC 81 MG tablet Take 81 mg by mouth once. PT states that he takes which every aspirin is the cheapest. Sometimes its the  and sometimes .    . atorvastatin (LIPITOR) 20 MG tablet Take 1 tablet (20 mg total) daily by mouth. 90 tablet 44  . hydrochlorothiazide (HYDRODIURIL) 25 MG tablet Take 1 tablet (25 mg total) daily by mouth. 90 tablet 3  . ALPRAZolam (XANAX) 1 MG tablet TAKE 1/2 TO  1  TABLET     THREE TIMES   DAILY  AS    NEEDED 90 tablet 3  . cyclobenzaprine (FLEXERIL) 5 MG tablet TAKE 1 TABLET EVERY 6 HOURSAS NEEDED 30 tablet 4  . ibuprofen (ADVIL,MOTRIN) 200 MG tablet Take 200 mg by mouth every 6 (six) hours as needed.    . Multiple Vitamin (MULTIVITAMIN) capsule Take 1 capsule by mouth daily.    Marland Kitchen oxyCODONE-acetaminophen (PERCOCET) 5-325 MG tablet Take 1 tablet by mouth every 8 (eight) hours as needed for severe pain. (Patient not taking: Reported on 07/06/2017) 120 tablet 0     . amiodarone  400 mg Oral BID  . aspirin  325 mg Oral Daily  . atorvastatin  20 mg Oral Daily  . enoxaparin (LOVENOX) injection  40 mg Subcutaneous Q24H  . metoprolol tartrate  50 mg Oral BID  . multivitamin with minerals  1 tablet Oral Daily  . sodium chloride flush   3 mL Intravenous Q12H    Infusions: . sodium chloride      No Known Allergies  Social History   Socioeconomic History  . Marital status: Married    Spouse name: Not on file  . Number of children: 2  . Years of education: Not on file  . Highest education level: Not on file  Occupational History  . Occupation: Seasonal outside Higher education careers adviser: [employer]  Social Needs  . Financial resource strain: Not on file  . Food insecurity:    Worry: Not on file    Inability: Not on file  . Transportation needs:    Medical: Not on file    Non-medical: Not on file  Tobacco Use  . Smoking status: Current Some Day Smoker    Packs/day: 0.50    Years: 15.00    Pack years: 7.50    Last attempt to quit: 02/18/2006    Years since quitting: 11.3  . Smokeless tobacco: Never Used  . Tobacco comment: smokes 2 cigarettes weekly; started smoking at age 80   Substance and Sexual Activity  . Alcohol use: Yes    Alcohol/week: 0.0 oz    Comment: occasional use  . Drug use: No  . Sexual activity: Not on  file  Lifestyle  . Physical activity:    Days per week: Not on file    Minutes per session: Not on file  . Stress: Not on file  Relationships  . Social connections:    Talks on phone: Not on file    Gets together: Not on file    Attends religious service: Not on file    Active member of club or organization: Not on file    Attends meetings of clubs or organizations: Not on file    Relationship status: Not on file  . Intimate partner violence:    Fear of current or ex partner: Not on file    Emotionally abused: Not on file    Physically abused: Not on file    Forced sexual activity: Not on file  Other Topics Concern  . Not on file  Social History Narrative  . Not on file    Family History  Problem Relation Age of Onset  . Alcohol abuse Father   . Hypertension Brother   . Cancer Other   . Diabetes Neg Hx     PHYSICAL EXAM: Vitals:   07/07/17 0417  07/07/17 0754  BP: 105/86 (!) 114/95  Pulse: 91 100  Resp: 17   Temp: 97.7 F (36.5 C) 97.7 F (36.5 C)  SpO2: 97% 99%     Intake/Output Summary (Last 24 hours) at 07/07/2017 0981 Last data filed at 07/07/2017 0600 Gross per 24 hour  Intake -  Output 550 ml  Net -550 ml    General:  Well appearing. No respiratory difficulty HEENT: normal Neck: supple. no JVD. Carotids 2+ bilat; no bruits. No lymphadenopathy or thryomegaly appreciated. Cor: PMI nondisplaced. Regular rate & rhythm. No rubs, gallops or murmurs. Lungs: clear Abdomen: soft, nontender, nondistended. No hepatosplenomegaly. No bruits or masses. Good bowel sounds. Extremities: no cyanosis, clubbing, rash, edema Neuro: alert & oriented x 3, cranial nerves grossly intact. moves all 4 extremities w/o difficulty. Affect pleasant.  ECG: atrial fibrillation with rapid ventricular response rate  Results for orders placed or performed during the hospital encounter of 07/06/17 (from the past 24 hour(s))  Basic metabolic panel     Status: Abnormal   Collection Time: 07/06/17  1:45 PM  Result Value Ref Range   Sodium 137 135 - 145 mmol/L   Potassium 3.9 3.5 - 5.1 mmol/L   Chloride 107 101 - 111 mmol/L   CO2 23 22 - 32 mmol/L   Glucose, Bld 179 (H) 65 - 99 mg/dL   BUN 31 (H) 6 - 20 mg/dL   Creatinine, Ser 1.91 0.61 - 1.24 mg/dL   Calcium 9.0 8.9 - 47.8 mg/dL   GFR calc non Af Amer >60 >60 mL/min   GFR calc Af Amer >60 >60 mL/min   Anion gap 7 5 - 15  CBC     Status: None   Collection Time: 07/06/17  1:45 PM  Result Value Ref Range   WBC 9.4 3.8 - 10.6 K/uL   RBC 4.88 4.40 - 5.90 MIL/uL   Hemoglobin 15.2 13.0 - 18.0 g/dL   HCT 29.5 62.1 - 30.8 %   MCV 92.0 80.0 - 100.0 fL   MCH 31.1 26.0 - 34.0 pg   MCHC 33.8 32.0 - 36.0 g/dL   RDW 65.7 84.6 - 96.2 %   Platelets 223 150 - 440 K/uL  Troponin I     Status: Abnormal   Collection Time: 07/06/17  1:45 PM  Result Value Ref Range  Troponin I 0.05 (HH) <0.03 ng/mL   Brain natriuretic peptide     Status: Abnormal   Collection Time: 07/06/17  1:45 PM  Result Value Ref Range   B Natriuretic Peptide 625.0 (H) 0.0 - 100.0 pg/mL  Protime-INR     Status: None   Collection Time: 07/06/17  1:50 PM  Result Value Ref Range   Prothrombin Time 14.8 11.4 - 15.2 seconds   INR 1.17   CBC     Status: None   Collection Time: 07/06/17  5:05 PM  Result Value Ref Range   WBC 9.4 3.8 - 10.6 K/uL   RBC 5.32 4.40 - 5.90 MIL/uL   Hemoglobin 16.6 13.0 - 18.0 g/dL   HCT 16.1 09.6 - 04.5 %   MCV 93.2 80.0 - 100.0 fL   MCH 31.2 26.0 - 34.0 pg   MCHC 33.4 32.0 - 36.0 g/dL   RDW 40.9 81.1 - 91.4 %   Platelets 226 150 - 440 K/uL  Creatinine, serum     Status: None   Collection Time: 07/06/17  5:05 PM  Result Value Ref Range   Creatinine, Ser 0.93 0.61 - 1.24 mg/dL   GFR calc non Af Amer >60 >60 mL/min   GFR calc Af Amer >60 >60 mL/min  TSH     Status: None   Collection Time: 07/06/17  5:05 PM  Result Value Ref Range   TSH 1.983 0.350 - 4.500 uIU/mL  Troponin I     Status: Abnormal   Collection Time: 07/06/17  5:05 PM  Result Value Ref Range   Troponin I 0.06 (HH) <0.03 ng/mL  Troponin I     Status: Abnormal   Collection Time: 07/06/17 11:43 PM  Result Value Ref Range   Troponin I 0.05 (HH) <0.03 ng/mL   Dg Chest 2 View  Result Date: 07/06/2017 CLINICAL DATA:  Acute shortness of breath and dizziness for 1 week. EXAM: CHEST - 2 VIEW COMPARISON:  10/06/2014 and prior chest radiographs FINDINGS: Cardiomegaly and median sternotomy again noted with pulmonary vascular congestion. Minimal LEFT LOWER lung scarring noted. Trace bilateral pleural effusions are present. There is no evidence of focal airspace disease, pulmonary edema, suspicious pulmonary nodule/mass, pleural effusion, or pneumothorax. No acute bony abnormalities are identified. Surgical hardware within the LOWER cervical spine noted. IMPRESSION: Cardiomegaly with pulmonary vascular congestion and trace  bilateral pleural effusions. Electronically Signed   By: Harmon Pier M.D.   On: 07/06/2017 14:23     ASSESSMENT AND PLAN: atrial fibrillation which appears to be chronic and rate control. Patient states that he's been in atrial fibrillation for years. Thus attempt at medical cardioversion would be unsuccessful thus will discontinue amiodarone. We will go up on metoprolol.may have to add digoxin. May go home today with follow-up in the office this Thursday at 2 PM.  Keo Schirmer A

## 2017-07-07 NOTE — Plan of Care (Signed)
  Problem: Education: Goal: Knowledge of General Education information will improve Outcome: Adequate for Discharge   Problem: Health Behavior/Discharge Planning: Goal: Ability to manage health-related needs will improve Outcome: Adequate for Discharge   Problem: Clinical Measurements: Goal: Ability to maintain clinical measurements within normal limits will improve Outcome: Adequate for Discharge Goal: Will remain free from infection Outcome: Adequate for Discharge Goal: Diagnostic test results will improve Outcome: Adequate for Discharge Goal: Respiratory complications will improve Outcome: Adequate for Discharge Goal: Cardiovascular complication will be avoided Outcome: Adequate for Discharge   Problem: Activity: Goal: Risk for activity intolerance will decrease Outcome: Adequate for Discharge   Problem: Nutrition: Goal: Adequate nutrition will be maintained Outcome: Adequate for Discharge   Problem: Coping: Goal: Level of anxiety will decrease Outcome: Adequate for Discharge   Problem: Elimination: Goal: Will not experience complications related to bowel motility Outcome: Adequate for Discharge Goal: Will not experience complications related to urinary retention Outcome: Adequate for Discharge   Problem: Pain Managment: Goal: General experience of comfort will improve Outcome: Adequate for Discharge   Problem: Safety: Goal: Ability to remain free from injury will improve Outcome: Adequate for Discharge   Problem: Skin Integrity: Goal: Risk for impaired skin integrity will decrease Outcome: Adequate for Discharge   Problem: Education: Goal: Knowledge of disease or condition will improve Outcome: Adequate for Discharge Goal: Understanding of medication regimen will improve Outcome: Adequate for Discharge   Problem: Activity: Goal: Ability to tolerate increased activity will improve Outcome: Adequate for Discharge   Problem: Cardiac: Goal: Ability to  achieve and maintain adequate cardiopulmonary perfusion will improve Outcome: Adequate for Discharge   Problem: Health Behavior/Discharge Planning: Goal: Ability to safely manage health-related needs after discharge will improve Outcome: Adequate for Discharge   

## 2017-07-07 NOTE — Progress Notes (Signed)
Advanced care plan.  Purpose of the Encounter: CODE STATUS  Parties in Attendance:Patient Patient's Decision Capacity:Good Subjective/Patient's story: Presented with dizziness, low blood pressure and elevated heart rate Objective/Medical story Had afib on presentation Seen by cardiology and meds adjusted for rate control Goals of care determination:  Advance directives discussed For now patient wants cardiac resuscitation, intubation and ventilator if need arises. CODE STATUS: Full code Time spent discussing advanced care planning: 16 minutes

## 2017-07-07 NOTE — Progress Notes (Signed)
Patient given discharge instructions with wife at bedside. Prescriptions given, wife and patient verbalized understanding. Appointment re-scheduled for them for convenience. Patient appreciated that. IV taken out and tele monitor taken off. Patient transported home via family vehicle in stable condition. Wheeled down in wheelchair by Psychologist, sport and exercise.

## 2017-07-07 NOTE — Plan of Care (Signed)
  Problem: Elimination: Goal: Will not experience complications related to urinary retention Outcome: Progressing   Problem: Pain Managment: Goal: General experience of comfort will improve Outcome: Progressing   Problem: Skin Integrity: Goal: Risk for impaired skin integrity will decrease Outcome: Progressing   Problem: Education: Goal: Understanding of medication regimen will improve Outcome: Progressing

## 2017-07-10 ENCOUNTER — Encounter: Payer: Self-pay | Admitting: Family Medicine

## 2017-07-10 ENCOUNTER — Ambulatory Visit (INDEPENDENT_AMBULATORY_CARE_PROVIDER_SITE_OTHER): Payer: BLUE CROSS/BLUE SHIELD | Admitting: Family Medicine

## 2017-07-10 VITALS — BP 120/80 | HR 72 | Temp 98.6°F | Resp 16 | Wt 164.0 lb

## 2017-07-10 DIAGNOSIS — I10 Essential (primary) hypertension: Secondary | ICD-10-CM | POA: Diagnosis not present

## 2017-07-10 DIAGNOSIS — I4891 Unspecified atrial fibrillation: Secondary | ICD-10-CM | POA: Diagnosis not present

## 2017-07-10 DIAGNOSIS — R0602 Shortness of breath: Secondary | ICD-10-CM | POA: Diagnosis not present

## 2017-07-10 DIAGNOSIS — I340 Nonrheumatic mitral (valve) insufficiency: Secondary | ICD-10-CM | POA: Diagnosis not present

## 2017-07-10 NOTE — Progress Notes (Addendum)
Patient: Samuel Woods Male    DOB: 1960-07-08   57 y.o.   MRN: [id number] Visit Date: 07/10/2017  Today's Provider: Mila Merry, MD   Chief Complaint  Patient presents with  . Hospitalization Follow-up   Subjective:    HPI  Follow up Hospitalization  Patient was admitted to [facility] on 07/06/2017 and discharged on 07/07/2017. He was treated for new onset Atrial Fibrillation. He presented with dizziness and rapid heart rate of 134.  Treatment for this included  Metoprolol amiodarone and digoxin. Amiodarone was discontinued prior to discharge and he did not start prescription for metoprolol due to pain and swelling in legs when he was given medication in hospital. Upon discharge patient was advised to follow up with the outpatient cardiology clinic. He reports fair compliance with treatment. Since being discharged from the hospital, patient reports that he has not been taking the Metoprolol. Patient has an appointment to follow up with Cardiology , Dr. Welton Flakes, later today.  He reports this condition is Improved.  ------------------------------------------------------------------------------------      No Known Allergies   Current Outpatient Medications:  .  ALPRAZolam (XANAX) 1 MG tablet, TAKE 1/2 TO  1  TABLET     THREE TIMES   DAILY  AS    NEEDED, Disp: 90 tablet, Rfl: 3 .  aspirin EC 81 MG tablet, Take 81 mg by mouth once. PT states that he takes which every aspirin is the cheapest. Sometimes its the  and sometimes ., Disp: , Rfl:  .  atorvastatin (LIPITOR) 20 MG tablet, Take 1 tablet (20 mg total) daily by mouth., Disp: 90 tablet, Rfl: 44 .  cyclobenzaprine (FLEXERIL) 5 MG tablet, TAKE 1 TABLET EVERY 6 HOURSAS NEEDED, Disp: 30 tablet, Rfl: 4 .  digoxin (LANOXIN) 0.125 MG tablet, Take 1 tablet (125 mcg total) by mouth daily., Disp: 30 tablet, Rfl: 0 .  hydrochlorothiazide (HYDRODIURIL) 25 MG tablet, Take 1 tablet (25 mg total) daily by mouth., Disp: 90 tablet, Rfl:  3 .  ibuprofen (ADVIL,MOTRIN) 200 MG tablet, Take 200 mg by mouth every 6 (six) hours as needed., Disp: , Rfl:  .  Multiple Vitamin (MULTIVITAMIN) capsule, Take 1 capsule by mouth daily., Disp: , Rfl:  .  metoprolol tartrate (LOPRESSOR) 100 MG tablet, Take 1 tablet (100 mg total) by mouth 2 (two) times daily. (Patient not taking: Reported on 07/10/2017), Disp: 60 tablet, Rfl: 0  Review of Systems  Constitutional: Negative for appetite change, chills and fever.  Respiratory: Negative for chest tightness, shortness of breath and wheezing.   Cardiovascular: Negative for chest pain and palpitations.  Gastrointestinal: Negative for abdominal pain, nausea and vomiting.  Musculoskeletal:       Bilateral leg pain    Social History   Tobacco Use  . Smoking status: Current Some Day Smoker    Packs/day: 0.50    Years: 15.00    Pack years: 7.50    Last attempt to quit: 02/18/2006    Years since quitting: 11.3  . Smokeless tobacco: Never Used  . Tobacco comment: smokes 2 cigarettes weekly; started smoking at age 72   Substance Use Topics  . Alcohol use: Yes    Alcohol/week: 0.0 oz    Comment: occasional use   Objective:   BP 120/80 (BP Location: Left Arm, Patient Position: Sitting, Cuff Size: Normal)   Pulse 72   Temp 98.6 F (37 C) (Oral)   Resp 16   Wt 164 lb (74.4 kg)  SpO2 99% Comment: room air  BMI 22.24 kg/m     Physical Exam   General Appearance:    Alert, cooperative, no distress  Eyes:    PERRL, conjunctiva/corneas clear, EOM's intact       Lungs:     Clear to auscultation bilaterally, respirations unlabored  Heart:     Irregularly irregular rhythm. Normal rate   Neurologic:   Awake, alert, oriented x 3. No apparent focal neurological           defect.           Assessment & Plan:     1. Atrial fibrillation, unspecified type (HCC) New onset. Rate is fairly well controlled on digoxin alone. Advised he would probably tolerate a lower dose of beta blocker if needed.  Counseled on natural history of a-fib and need to control heart risk and manage stroke risk. His CHADs-Vasc score is 1. Is currently on ASA. He is to follow up with cardiology later today to discuss long term management.   2. Hypertension Well controlled.  Continue current medications.    Wt Readings from Last 3 Encounters:  07/10/17 164 lb (74.4 kg)  07/06/17 162 lb (73.5 kg)  12/19/16 161 lb (73 kg)         Mila Merry, MD  [facility]

## 2017-07-10 NOTE — Patient Instructions (Addendum)
Discuss need for metoprolol with your cardiologist this afternoon  Atrial Fibrillation Atrial fibrillation is a type of heartbeat that is irregular or fast (rapid). If you have this condition, your heart keeps quivering in a weird (chaotic) way. This condition can make it so your heart cannot pump blood normally. Having this condition gives a person more risk for stroke, heart failure, and other heart problems. There are different types of atrial fibrillation. Talk with your doctor to learn about the type that you have. Follow these instructions at home:  Take over-the-counter and prescription medicines only as told by your doctor.  If your doctor prescribed a blood-thinning medicine, take it exactly as told. Taking too much of it can cause bleeding. If you do not take enough of it, you will not have the protection that you need against stroke and other problems.  Do not use any tobacco products. These include cigarettes, chewing tobacco, and e-cigarettes. If you need help quitting, ask your doctor.  If you have apnea (obstructive sleep apnea), manage it as told by your doctor.  Do not drink alcohol.  Do not drink beverages that have caffeine. These include coffee, soda, and tea.  Maintain a healthy weight. Do not use diet pills unless your doctor says they are safe for you. Diet pills may make heart problems worse.  Follow diet instructions as told by your doctor.  Exercise regularly as told by your doctor.  Keep all follow-up visits as told by your doctor. This is important. Contact a doctor if:  You notice a change in the speed, rhythm, or strength of your heartbeat.  You are taking a blood-thinning medicine and you notice more bruising.  You get tired more easily when you move or exercise. Get help right away if:  You have pain in your chest or your belly (abdomen).  You have sweating or weakness.  You feel sick to your stomach (nauseous).  You notice blood in your throw  up (vomit), poop (stool), or pee (urine).  You are short of breath.  You suddenly have swollen feet and ankles.  You feel dizzy.  Your suddenly get weak or numb in your face, arms, or legs, especially if it happens on one side of your body.  You have trouble talking, trouble understanding, or both.  Your face or your eyelid droops on one side. These symptoms may be an emergency. Do not wait to see if the symptoms will go away. Get medical help right away. Call your local emergency services (911 in the [place] Do not drive yourself to the hospital. This information is not intended to replace advice given to you by your health care provider. Make sure you discuss any questions you have with your health care provider. Document Released: 11/14/2007 Document Revised: 07/13/2015 Document Reviewed: 06/01/2014 Elsevier Interactive Patient Education  [employer].

## 2017-07-17 ENCOUNTER — Telehealth: Payer: Self-pay | Admitting: Family Medicine

## 2017-07-17 DIAGNOSIS — R079 Chest pain, unspecified: Secondary | ICD-10-CM | POA: Diagnosis not present

## 2017-07-17 NOTE — Telephone Encounter (Signed)
Received Samuel Woods ( [employer]) FMLA form. Placed form in providers box. Thanks CC

## 2017-07-18 NOTE — Telephone Encounter (Addendum)
Fax received and placed on given to Dr. Sherrie MustacheFisher.

## 2017-07-21 DIAGNOSIS — R0602 Shortness of breath: Secondary | ICD-10-CM | POA: Diagnosis not present

## 2017-07-21 DIAGNOSIS — I25810 Atherosclerosis of coronary artery bypass graft(s) without angina pectoris: Secondary | ICD-10-CM | POA: Diagnosis not present

## 2017-07-21 DIAGNOSIS — I420 Dilated cardiomyopathy: Secondary | ICD-10-CM | POA: Diagnosis not present

## 2017-07-21 DIAGNOSIS — F17200 Nicotine dependence, unspecified, uncomplicated: Secondary | ICD-10-CM | POA: Diagnosis not present

## 2017-07-21 DIAGNOSIS — I509 Heart failure, unspecified: Secondary | ICD-10-CM | POA: Diagnosis not present

## 2017-07-24 DIAGNOSIS — I420 Dilated cardiomyopathy: Secondary | ICD-10-CM | POA: Diagnosis not present

## 2017-07-31 DIAGNOSIS — I4891 Unspecified atrial fibrillation: Secondary | ICD-10-CM | POA: Diagnosis not present

## 2017-07-31 DIAGNOSIS — I509 Heart failure, unspecified: Secondary | ICD-10-CM | POA: Diagnosis not present

## 2017-07-31 DIAGNOSIS — I420 Dilated cardiomyopathy: Secondary | ICD-10-CM | POA: Diagnosis not present

## 2017-07-31 DIAGNOSIS — R0602 Shortness of breath: Secondary | ICD-10-CM | POA: Diagnosis not present

## 2017-08-29 ENCOUNTER — Other Ambulatory Visit: Payer: Self-pay | Admitting: Family Medicine

## 2017-09-01 DIAGNOSIS — R079 Chest pain, unspecified: Secondary | ICD-10-CM | POA: Diagnosis not present

## 2017-09-01 DIAGNOSIS — I10 Essential (primary) hypertension: Secondary | ICD-10-CM | POA: Diagnosis not present

## 2017-09-01 DIAGNOSIS — I340 Nonrheumatic mitral (valve) insufficiency: Secondary | ICD-10-CM | POA: Diagnosis not present

## 2017-09-01 DIAGNOSIS — I509 Heart failure, unspecified: Secondary | ICD-10-CM | POA: Diagnosis not present

## 2017-09-10 DIAGNOSIS — I2510 Atherosclerotic heart disease of native coronary artery without angina pectoris: Secondary | ICD-10-CM | POA: Diagnosis not present

## 2017-09-10 DIAGNOSIS — I4891 Unspecified atrial fibrillation: Secondary | ICD-10-CM | POA: Diagnosis not present

## 2017-09-10 DIAGNOSIS — I509 Heart failure, unspecified: Secondary | ICD-10-CM | POA: Diagnosis not present

## 2017-09-10 DIAGNOSIS — I420 Dilated cardiomyopathy: Secondary | ICD-10-CM | POA: Diagnosis not present

## 2017-09-30 DIAGNOSIS — I25810 Atherosclerosis of coronary artery bypass graft(s) without angina pectoris: Secondary | ICD-10-CM | POA: Diagnosis not present

## 2017-09-30 DIAGNOSIS — I2510 Atherosclerotic heart disease of native coronary artery without angina pectoris: Secondary | ICD-10-CM | POA: Diagnosis not present

## 2017-09-30 DIAGNOSIS — I509 Heart failure, unspecified: Secondary | ICD-10-CM | POA: Diagnosis not present

## 2017-09-30 DIAGNOSIS — I4891 Unspecified atrial fibrillation: Secondary | ICD-10-CM | POA: Diagnosis not present

## 2017-09-30 DIAGNOSIS — I420 Dilated cardiomyopathy: Secondary | ICD-10-CM | POA: Diagnosis not present

## 2017-10-09 ENCOUNTER — Other Ambulatory Visit: Payer: Self-pay | Admitting: Cardiovascular Disease

## 2017-10-09 DIAGNOSIS — I420 Dilated cardiomyopathy: Secondary | ICD-10-CM | POA: Diagnosis not present

## 2017-10-09 DIAGNOSIS — I10 Essential (primary) hypertension: Secondary | ICD-10-CM | POA: Diagnosis not present

## 2017-10-09 DIAGNOSIS — I4891 Unspecified atrial fibrillation: Secondary | ICD-10-CM | POA: Diagnosis not present

## 2017-10-09 DIAGNOSIS — I25810 Atherosclerosis of coronary artery bypass graft(s) without angina pectoris: Secondary | ICD-10-CM | POA: Diagnosis not present

## 2017-10-09 DIAGNOSIS — I2510 Atherosclerotic heart disease of native coronary artery without angina pectoris: Secondary | ICD-10-CM | POA: Diagnosis not present

## 2017-10-13 ENCOUNTER — Encounter: Payer: Self-pay | Admitting: Family Medicine

## 2017-10-13 DIAGNOSIS — I519 Heart disease, unspecified: Secondary | ICD-10-CM | POA: Insufficient documentation

## 2017-10-28 DIAGNOSIS — I25810 Atherosclerosis of coronary artery bypass graft(s) without angina pectoris: Secondary | ICD-10-CM | POA: Diagnosis not present

## 2017-10-28 DIAGNOSIS — I4891 Unspecified atrial fibrillation: Secondary | ICD-10-CM | POA: Diagnosis not present

## 2017-10-28 DIAGNOSIS — I255 Ischemic cardiomyopathy: Secondary | ICD-10-CM | POA: Diagnosis not present

## 2017-10-28 DIAGNOSIS — I2510 Atherosclerotic heart disease of native coronary artery without angina pectoris: Secondary | ICD-10-CM | POA: Diagnosis not present

## 2017-10-28 DIAGNOSIS — Z23 Encounter for immunization: Secondary | ICD-10-CM | POA: Diagnosis not present

## 2017-11-05 DIAGNOSIS — I482 Chronic atrial fibrillation: Secondary | ICD-10-CM | POA: Diagnosis not present

## 2017-11-11 ENCOUNTER — Ambulatory Visit: Payer: Self-pay | Admitting: Cardiovascular Disease

## 2017-11-11 ENCOUNTER — Telehealth: Payer: Self-pay | Admitting: Family Medicine

## 2017-11-11 DIAGNOSIS — I4891 Unspecified atrial fibrillation: Secondary | ICD-10-CM | POA: Diagnosis not present

## 2017-11-11 DIAGNOSIS — I2510 Atherosclerotic heart disease of native coronary artery without angina pectoris: Secondary | ICD-10-CM | POA: Diagnosis not present

## 2017-11-11 DIAGNOSIS — I255 Ischemic cardiomyopathy: Secondary | ICD-10-CM | POA: Diagnosis not present

## 2017-11-11 DIAGNOSIS — I420 Dilated cardiomyopathy: Secondary | ICD-10-CM | POA: Diagnosis not present

## 2017-11-11 DIAGNOSIS — I10 Essential (primary) hypertension: Secondary | ICD-10-CM | POA: Diagnosis not present

## 2017-11-11 DIAGNOSIS — I25810 Atherosclerosis of coronary artery bypass graft(s) without angina pectoris: Secondary | ICD-10-CM | POA: Diagnosis not present

## 2017-11-11 NOTE — Telephone Encounter (Signed)
Tried calling patient. Left message to call back. Please advise patient when they return our call and schedule his appointment.

## 2017-11-11 NOTE — Telephone Encounter (Signed)
He is due for follow up o.v. And physical in November.

## 2017-11-11 NOTE — Telephone Encounter (Signed)
Pt's wife - [name] called regarding pt having a cardio inversion done on 11-17-17 w/  Dr. Welton FlakesKhan.  Dr. Welton FlakesKhan is seeing pt on Oct. 2 for f/u - review. Pt is wanting to know when he needs to come in for a f/u visit w/ Dr. Sherrie MustacheFisher.  Please call pt back to advise.  Thanks, [employer] [facility] [employer]

## 2017-11-11 NOTE — Telephone Encounter (Signed)
Please advise 

## 2017-11-13 ENCOUNTER — Other Ambulatory Visit: Payer: Self-pay | Admitting: Cardiovascular Disease

## 2017-11-14 ENCOUNTER — Other Ambulatory Visit: Payer: Self-pay | Admitting: Cardiovascular Disease

## 2017-11-14 DIAGNOSIS — I25810 Atherosclerosis of coronary artery bypass graft(s) without angina pectoris: Secondary | ICD-10-CM | POA: Diagnosis not present

## 2017-11-14 DIAGNOSIS — I10 Essential (primary) hypertension: Secondary | ICD-10-CM | POA: Diagnosis not present

## 2017-11-14 DIAGNOSIS — I2510 Atherosclerotic heart disease of native coronary artery without angina pectoris: Secondary | ICD-10-CM | POA: Diagnosis not present

## 2017-11-14 DIAGNOSIS — I4891 Unspecified atrial fibrillation: Secondary | ICD-10-CM | POA: Diagnosis not present

## 2017-11-17 ENCOUNTER — Ambulatory Visit: Payer: BLUE CROSS/BLUE SHIELD | Admitting: Registered Nurse

## 2017-11-17 ENCOUNTER — Encounter
Admission: RE | Disposition: A | Discharge status: Home / Self Care | Payer: Self-pay | Source: Ambulatory Visit | Attending: Cardiovascular Disease

## 2017-11-17 ENCOUNTER — Ambulatory Visit
Admission: RE | Admit: 2017-11-17 | Discharge: 2017-11-17 | Disposition: A | Discharge status: Home / Self Care | Payer: BLUE CROSS/BLUE SHIELD | Source: Ambulatory Visit | Attending: Registered Nurse | Admitting: Registered Nurse

## 2017-11-17 ENCOUNTER — Ambulatory Visit
Admission: RE | Admit: 2017-11-17 | Discharge: 2017-11-17 | Disposition: A | Discharge status: Home / Self Care | Payer: BLUE CROSS/BLUE SHIELD | Source: Ambulatory Visit | Attending: Cardiovascular Disease | Admitting: Cardiovascular Disease

## 2017-11-17 DIAGNOSIS — I110 Hypertensive heart disease with heart failure: Secondary | ICD-10-CM | POA: Diagnosis not present

## 2017-11-17 DIAGNOSIS — I482 Chronic atrial fibrillation: Secondary | ICD-10-CM | POA: Diagnosis not present

## 2017-11-17 DIAGNOSIS — Z7901 Long term (current) use of anticoagulants: Secondary | ICD-10-CM | POA: Diagnosis not present

## 2017-11-17 DIAGNOSIS — E785 Hyperlipidemia, unspecified: Secondary | ICD-10-CM | POA: Diagnosis not present

## 2017-11-17 DIAGNOSIS — Z79899 Other long term (current) drug therapy: Secondary | ICD-10-CM | POA: Insufficient documentation

## 2017-11-17 DIAGNOSIS — I509 Heart failure, unspecified: Secondary | ICD-10-CM | POA: Diagnosis not present

## 2017-11-17 DIAGNOSIS — F17210 Nicotine dependence, cigarettes, uncomplicated: Secondary | ICD-10-CM | POA: Insufficient documentation

## 2017-11-17 DIAGNOSIS — I4891 Unspecified atrial fibrillation: Secondary | ICD-10-CM | POA: Diagnosis not present

## 2017-11-17 HISTORY — PX: CARDIOVERSION: EP1203

## 2017-11-17 HISTORY — DX: Unspecified atrial fibrillation: I48.91

## 2017-11-17 HISTORY — PX: TEE WITHOUT CARDIOVERSION: SHX5443

## 2017-11-17 LAB — PROTIME-INR
INR: 2.75
PROTHROMBIN TIME: 28.9 s — AB (ref 11.4–15.2)

## 2017-11-17 SURGERY — ECHOCARDIOGRAM, TRANSESOPHAGEAL
Anesthesia: General | Laterality: Right

## 2017-11-17 SURGERY — ECHOCARDIOGRAM, TRANSESOPHAGEAL
Anesthesia: Moderate Sedation

## 2017-11-17 MED ORDER — BUTAMBEN-TETRACAINE-BENZOCAINE 2-2-14 % EX AERO
INHALATION_SPRAY | CUTANEOUS | Status: AC
  Filled 2017-11-17: qty 5

## 2017-11-17 MED ORDER — PROPOFOL 10 MG/ML IV BOLUS
INTRAVENOUS | Status: AC
  Filled 2017-11-17: qty 20

## 2017-11-17 MED ORDER — MIDAZOLAM HCL 2 MG/2ML IJ SOLN
INTRAMUSCULAR | Status: AC
  Filled 2017-11-17: qty 2

## 2017-11-17 MED ORDER — FENTANYL CITRATE (PF) 100 MCG/2ML IJ SOLN
INTRAMUSCULAR | Status: DC | PRN
  Administered 2017-11-17: 25 ug via INTRAVENOUS
  Administered 2017-11-17: 50 ug via INTRAVENOUS
  Administered 2017-11-17: 25 ug via INTRAVENOUS

## 2017-11-17 MED ORDER — LIDOCAINE VISCOUS HCL 2 % MT SOLN
OROMUCOSAL | Status: AC
  Filled 2017-11-17: qty 15

## 2017-11-17 MED ORDER — FENTANYL CITRATE (PF) 100 MCG/2ML IJ SOLN
INTRAMUSCULAR | Status: AC
  Filled 2017-11-17: qty 2

## 2017-11-17 MED ORDER — SODIUM CHLORIDE 0.9 % IV SOLN
INTRAVENOUS | Status: DC
  Administered 2017-11-17: 08:00:00 via INTRAVENOUS

## 2017-11-17 MED ORDER — PROPOFOL 10 MG/ML IV BOLUS
INTRAVENOUS | Status: DC | PRN
  Administered 2017-11-17: 10 mg via INTRAVENOUS
  Administered 2017-11-17: 20 mg via INTRAVENOUS
  Administered 2017-11-17: 30 mg via INTRAVENOUS
  Administered 2017-11-17: 20 mg via INTRAVENOUS

## 2017-11-17 MED ORDER — MIDAZOLAM HCL 2 MG/2ML IJ SOLN
INTRAMUSCULAR | Status: DC | PRN
  Administered 2017-11-17 (×2): 1 mg via INTRAVENOUS

## 2017-11-17 NOTE — Anesthesia Post-op Follow-up Note (Signed)
Anesthesia QCDR form completed.        

## 2017-11-17 NOTE — Progress Notes (Signed)
*  PRELIMINARY RESULTS* Echocardiogram 2D Echocardiogram has been performed.  Cristela Blue 11/17/2017, 8:04 AM

## 2017-11-17 NOTE — Anesthesia Preprocedure Evaluation (Signed)
Anesthesia Evaluation  Patient identified by MRN, date of birth, ID band Patient awake    Reviewed: Allergy & Precautions, H&P , NPO status , Patient's Chart, lab work & pertinent test results, reviewed documented beta blocker date and time   History of Anesthesia Complications Negative for: history of anesthetic complications  Airway Mallampati: II  TM Distance: >3 FB Neck ROM: full    Dental  (+) Dental Advidsory Given, Missing   Pulmonary neg pulmonary ROS, former smoker,           Cardiovascular Exercise Tolerance: Good hypertension, (-) angina+CHF  (-) CAD, (-) Past MI, (-) Cardiac Stents and (-) CABG + dysrhythmias + Valvular Problems/Murmurs      Neuro/Psych PSYCHIATRIC DISORDERS Anxiety negative neurological ROS  negative psych ROS   GI/Hepatic negative GI ROS, Neg liver ROS,   Endo/Other  negative endocrine ROS  Renal/GU negative Renal ROS  negative genitourinary   Musculoskeletal   Abdominal   Peds  Hematology negative hematology ROS (+)   Anesthesia Other Findings Past Medical History: No date: Afib (HCC) No date: History of chicken pox 01/18/2009: History of lung abscess No date: History of measles No date: Hyperlipemia No date: Hypertension   Reproductive/Obstetrics negative OB ROS                             Anesthesia Physical Anesthesia Plan  ASA: III  Anesthesia Plan: General   Post-op Pain Management:    Induction: Intravenous  PONV Risk Score and Plan: 2 and TIVA  Airway Management Planned: Natural Airway and Nasal Cannula  Additional Equipment:   Intra-op Plan:   Post-operative Plan:   Informed Consent: I have reviewed the patients History and Physical, chart, labs and discussed the procedure including the risks, benefits and alternatives for the proposed anesthesia with the patient or authorized representative who has indicated his/her understanding  and acceptance.   Dental Advisory Given  Plan Discussed with: Anesthesiologist, CRNA and Surgeon  Anesthesia Plan Comments:         Anesthesia Quick Evaluation

## 2017-11-17 NOTE — Transfer of Care (Signed)
Immediate Anesthesia Transfer of Care Note  Patient: Samuel Woods  Procedure(s) Performed: Procedure(s): TRANSESOPHAGEAL ECHOCARDIOGRAM (TEE) (N/A) CARDIOVERSION (Right)  Patient Location: PACU and Short Stay  Anesthesia Type:General  Level of Consciousness: awake, alert  and oriented  Airway & Oxygen Therapy: Patient Spontanous Breathing and Patient connected to nasal cannula oxygen  Post-op Assessment: Report given to RN and Post -op Vital signs reviewed and stable  Post vital signs: Reviewed and stable  Last Vitals:  Vitals:   11/17/17 0759 11/17/17 0801  BP: 94/74 105/71  Pulse:  69  Resp:  16  Temp:    SpO2:  97%    Complications: No apparent anesthesia complications

## 2017-11-17 NOTE — CV Procedure (Signed)
  NAME:  Samuel Woods   MRN: [id number] DOB:  Jul 04, 1960   ADMIT DATE: 11/17/2017  Procedure: Electrical Cardioversion Indications:  Atrial Fibrillation  Procedure Details:    Time Out: Verified patient identification, verified procedure, site/side was marked, verified correct patient position, special equipment/implants available, medications/allergies/relevent history reviewed, required imaging and test results available.    Patient placed on cardiac monitor, pulse oximetry, supplemental oxygen as necessary.  Sedation given:  Pacer pads placed   Cardioverted .  200 J Cardioverted at 200 J  Evaluation: Findings: Post procedure EKG shows: Normal sinus rhythm Complications: None Patient did well.     Laurier Nancy, M.D. [facility]   11/17/2017 8:12 AM

## 2017-11-17 NOTE — Anesthesia Postprocedure Evaluation (Signed)
Anesthesia Post Note  Patient: Samuel Woods  Procedure(s) Performed: TRANSESOPHAGEAL ECHOCARDIOGRAM (TEE) (N/A ) CARDIOVERSION (Right )  Patient location during evaluation: PACU Anesthesia Type: General Level of consciousness: awake and alert Pain management: pain level controlled Vital Signs Assessment: post-procedure vital signs reviewed and stable Respiratory status: spontaneous breathing, nonlabored ventilation, respiratory function stable and patient connected to nasal cannula oxygen Cardiovascular status: blood pressure returned to baseline and stable Postop Assessment: no apparent nausea or vomiting Anesthetic complications: no     Last Vitals:  Vitals:   11/17/17 0815 11/17/17 0830  BP: 90/76 104/67  Pulse: (!) 57 (!) 59  Resp: 11 14  Temp:    SpO2: 97% 95%    Last Pain:  Vitals:   11/17/17 0830  TempSrc:   PainSc: 0-No pain                 Lenard Simmer

## 2017-11-17 NOTE — Anesthesia Procedure Notes (Signed)
Date/Time: 11/17/2017 7:39 AM Performed by: Stormy Fabian, CRNA Pre-anesthesia Checklist: Patient identified, Emergency Drugs available, Suction available and Patient being monitored Patient Re-evaluated:Patient Re-evaluated prior to induction Oxygen Delivery Method: Nasal cannula Induction Type: IV induction Dental Injury: Teeth and Oropharynx as per pre-operative assessment  Comments: Nasal cannula with etCO2 monitoring

## 2017-11-19 DIAGNOSIS — I4891 Unspecified atrial fibrillation: Secondary | ICD-10-CM | POA: Diagnosis not present

## 2017-11-19 DIAGNOSIS — I10 Essential (primary) hypertension: Secondary | ICD-10-CM | POA: Diagnosis not present

## 2017-11-19 DIAGNOSIS — I509 Heart failure, unspecified: Secondary | ICD-10-CM | POA: Diagnosis not present

## 2017-11-21 ENCOUNTER — Ambulatory Visit (INDEPENDENT_AMBULATORY_CARE_PROVIDER_SITE_OTHER): Payer: BLUE CROSS/BLUE SHIELD | Admitting: Family Medicine

## 2017-11-21 ENCOUNTER — Encounter: Payer: Self-pay | Admitting: Family Medicine

## 2017-11-21 VITALS — BP 112/64 | HR 98 | Temp 98.4°F | Resp 16 | Ht 72.0 in | Wt 163.4 lb

## 2017-11-21 DIAGNOSIS — I5022 Chronic systolic (congestive) heart failure: Secondary | ICD-10-CM | POA: Diagnosis not present

## 2017-11-21 DIAGNOSIS — R0602 Shortness of breath: Secondary | ICD-10-CM | POA: Diagnosis not present

## 2017-11-21 DIAGNOSIS — I4891 Unspecified atrial fibrillation: Secondary | ICD-10-CM | POA: Diagnosis not present

## 2017-11-21 NOTE — Progress Notes (Signed)
Patient: Samuel Woods Male    DOB: 05-22-1960   57 y.o.   MRN: [id number] Visit Date: 11/21/2017  Today's Provider: Mila Merry, MD   Chief Complaint  Patient presents with  . Follow-up   Subjective:    HPI Patient presebts oday for a follow up. He reports that he just had a cardioversion on 11/17/2017 by Dr. Welton Flakes. He reports he had successful cardioversion for a-fib on 11-17-2017. States that his breathing has greatly improved since then. Has also been starting on torsemide. States he has been very weak and sedentary since he was diagnosed with a-fib earlier this year, but already feels like his energy is improving and is eating better. Is planning on starting to get some exercise. He reports his next follow up with Dr. Welton Flakes I on 12-02-12.   He states he has been getting labs including cholesterol, electrolytes, and warfarin monitoring done frequently at Dr. Milta Deiters office.     Allergies  Allergen Reactions  . Dilaudid [Hydromorphone Hcl] Other (See Comments)    According to patient's wife he had hallucinations after having dilaudid     Current Outpatient Medications:  .  ALPRAZolam (XANAX) 1 MG tablet, TAKE 1/2 TO  1  TABLET     THREE TIMES DAILY  AS      NEEDED (Patient taking differently: Take 1 mg by mouth at bedtime. ), Disp: 90 tablet, Rfl: 3 .  amiodarone (PACERONE) 200 MG tablet, Take 200-600 mg by mouth See admin instructions. Take 4 tablets (800 mg) by mouth daily for 1 week, then take 3 tablets (600 mg) (stop on 11/06/2017) by mouth daily for 1 week, then take 2 tablets (400 mg) by mouth 1 week (beginning on 11/13/17), then begin 1 tablet daily on 11/30/17, Disp: , Rfl: 0 .  carvedilol (COREG) 12.5 MG tablet, Take 12.5 mg by mouth 2 (two) times daily., Disp: , Rfl: 1 .  cyclobenzaprine (FLEXERIL) 5 MG tablet, TAKE 1 TABLET EVERY 6 HOURSAS NEEDED (Patient taking differently: Take 5 mg by mouth every 6 (six) hours as needed for muscle spasms. ), Disp: 30 tablet, Rfl:  4 .  ENTRESTO 24-26 MG, Take 2 tablets by mouth 2 (two) times daily. , Disp: , Rfl: 3 .  ibuprofen (ADVIL,MOTRIN) 200 MG tablet, Take 400 mg by mouth every 8 (eight) hours as needed (for pain.). , Disp: , Rfl:  .  Multiple Vitamin (MULTIVITAMIN WITH MINERALS) TABS tablet, Take 1 tablet by mouth daily. Centrum Silver Men 50+, Disp: , Rfl:  .  rosuvastatin (CRESTOR) 40 MG tablet, Take 40 mg by mouth daily., Disp: , Rfl:  .  warfarin (COUMADIN) 1 MG tablet, Take 2 mg by mouth daily. , Disp: , Rfl: 0 .  warfarin (COUMADIN) 3 MG tablet, Take 3 mg by mouth daily., Disp: , Rfl: 1 .  hydrochlorothiazide (HYDRODIURIL) 25 MG tablet, Take 1 tablet (25 mg total) daily by mouth. (Patient not taking: Reported on 11/11/2017), Disp: 90 tablet, Rfl: 3 .  pravastatin (PRAVACHOL) 20 MG tablet, Take 10 mg by mouth daily., Disp: , Rfl: 1  Review of Systems  Constitutional: Negative for appetite change, chills and fever.  Respiratory: Negative for chest tightness, shortness of breath and wheezing.   Cardiovascular: Negative for chest pain and palpitations.  Gastrointestinal: Negative for abdominal pain, nausea and vomiting.    Social History   Tobacco Use  . Smoking status: Former Smoker    Packs/day: 0.50    Years: 15.00  Pack years: 7.50    Last attempt to quit: 07/19/2017    Years since quitting: 0.3  . Smokeless tobacco: Never Used  . Tobacco comment: stopped smoking 10/2017.   Substance Use Topics  . Alcohol use: Yes    Alcohol/week: 0.0 standard drinks    Comment: occasional use   Objective:   BP 112/64 (BP Location: Right Arm, Patient Position: Sitting, Cuff Size: Normal)   Pulse 98   Temp 98.4 F (36.9 C)   Resp 16   Ht 6' (1.829 m)   Wt 163 lb 6.4 oz (74.1 kg)   SpO2 98%   BMI 22.16 kg/m  Vitals:   11/21/17 0836  BP: 112/64  Pulse: 98  Resp: 16  Temp: 98.4 F (36.9 C)  SpO2: 98%  Weight: 163 lb 6.4 oz (74.1 kg)  Height: 6' (1.829 m)     Physical Exam   General Appearance:     Alert, cooperative, no distress  Eyes:    PERRL, conjunctiva/corneas clear, EOM's intact       Lungs:     Clear to auscultation bilaterally, respirations unlabored  Heart:    Regular rate and rhythm  Neurologic:   Awake, alert, oriented x 3. No apparent focal neurological           defect.       spirometry mild obstruction.     Assessment & Plan:     1. Shortness of breath Improved since cardioversion, is on amiodarone.  - Spirometry with graph; Future - Spirometry with graph  2. Atrial fibrillation, unspecified type [facility]) Reviewed routine monitoring for patients taking amiodarone. He states he has had dilated eye exam a few months ago. Spirometry was normal. May need referral to pulmonary for DLCO if he remains on amiodarone.   3. Chronic systolic heart failure (HCC) Stable on entresto. Now post procedure day #4 for cardioversion and doing well. Follow up cardiology as scheduled.   Follow up 4 months.         Mila Merry, MD  [facility]

## 2017-11-21 NOTE — Telephone Encounter (Signed)
Patient was seen in the office this morning 

## 2017-12-02 ENCOUNTER — Encounter: Payer: Self-pay | Admitting: Family Medicine

## 2017-12-02 DIAGNOSIS — I255 Ischemic cardiomyopathy: Secondary | ICD-10-CM | POA: Diagnosis not present

## 2017-12-02 DIAGNOSIS — I25810 Atherosclerosis of coronary artery bypass graft(s) without angina pectoris: Secondary | ICD-10-CM | POA: Diagnosis not present

## 2017-12-02 DIAGNOSIS — I420 Dilated cardiomyopathy: Secondary | ICD-10-CM | POA: Diagnosis not present

## 2017-12-02 DIAGNOSIS — I509 Heart failure, unspecified: Secondary | ICD-10-CM | POA: Diagnosis not present

## 2017-12-02 DIAGNOSIS — I4891 Unspecified atrial fibrillation: Secondary | ICD-10-CM | POA: Diagnosis not present

## 2018-02-02 DIAGNOSIS — I420 Dilated cardiomyopathy: Secondary | ICD-10-CM | POA: Diagnosis not present

## 2018-02-02 DIAGNOSIS — I4891 Unspecified atrial fibrillation: Secondary | ICD-10-CM | POA: Diagnosis not present

## 2018-02-02 DIAGNOSIS — I10 Essential (primary) hypertension: Secondary | ICD-10-CM | POA: Diagnosis not present

## 2018-02-02 DIAGNOSIS — R0602 Shortness of breath: Secondary | ICD-10-CM | POA: Diagnosis not present

## 2018-02-16 ENCOUNTER — Other Ambulatory Visit: Payer: Self-pay | Admitting: Family Medicine

## 2018-02-16 DIAGNOSIS — M545 Low back pain, unspecified: Secondary | ICD-10-CM

## 2018-02-16 MED ORDER — CYCLOBENZAPRINE HCL 5 MG PO TABS: 5.0000 mg | ORAL_TABLET | Freq: Four times a day (QID) | ORAL | 3 refills | Status: DC | PRN

## 2018-02-16 MED ORDER — ALPRAZOLAM 1 MG PO TABS: 1.0000 mg | ORAL_TABLET | Freq: Every day | ORAL | 2 refills | Status: DC

## 2018-02-16 NOTE — Telephone Encounter (Signed)
Pt needing refills on:  ALPRAZolam (XANAX) 1 MG tablet cyclobenzaprine (FLEXERIL) 5 MG tablet  Pt wanting refills to be sent to new pharmacy:  [facility]  Address: [address], [place], [place] [address] Phone: [phone]   Thanks, [facility]

## 2018-03-24 ENCOUNTER — Ambulatory Visit: Payer: Self-pay | Admitting: Family Medicine

## 2018-03-24 ENCOUNTER — Encounter: Payer: Self-pay | Admitting: Family Medicine

## 2018-03-24 VITALS — BP 156/91 | HR 69 | Temp 98.1°F | Resp 16 | Wt 183.0 lb

## 2018-03-24 DIAGNOSIS — I10 Essential (primary) hypertension: Secondary | ICD-10-CM

## 2018-03-24 DIAGNOSIS — I340 Nonrheumatic mitral (valve) insufficiency: Secondary | ICD-10-CM | POA: Insufficient documentation

## 2018-03-24 DIAGNOSIS — I071 Rheumatic tricuspid insufficiency: Secondary | ICD-10-CM

## 2018-03-24 DIAGNOSIS — I4891 Unspecified atrial fibrillation: Secondary | ICD-10-CM

## 2018-03-24 DIAGNOSIS — Z9889 Other specified postprocedural states: Secondary | ICD-10-CM

## 2018-03-24 DIAGNOSIS — M25471 Effusion, right ankle: Secondary | ICD-10-CM

## 2018-03-24 DIAGNOSIS — R7303 Prediabetes: Secondary | ICD-10-CM

## 2018-03-24 DIAGNOSIS — E785 Hyperlipidemia, unspecified: Secondary | ICD-10-CM

## 2018-03-24 DIAGNOSIS — I519 Heart disease, unspecified: Secondary | ICD-10-CM

## 2018-03-24 LAB — POCT GLYCOSYLATED HEMOGLOBIN (HGB A1C): HEMOGLOBIN A1C: 5.8 % — AB (ref 4.0–5.6)

## 2018-03-24 NOTE — Patient Instructions (Addendum)
.   Please review the attached list of medications and notify my office if there are any errors.   . Please bring all of your medications to every appointment so we can make sure that our medication list is the same as yours.    You can take 40 atorvastatin a day In place of 40mg  rosuvastatin until you run out   Call Dr. Welton Flakes about getting refills for carvedilol

## 2018-03-24 NOTE — Progress Notes (Signed)
Patient: Samuel Woods Male    DOB: 1960/07/21   58 y.o.   MRN: [id number] Visit Date: 03/24/2018  Today's Provider: Mila Merry, MD   Chief Complaint  Patient presents with  . Hypertension  . Hyperlipidemia  . Hyperglycemia   Subjective:          Prediabetes, Follow-up:   Lab Results  Component Value Date   HGBA1C 5.8 (H) 12/13/2015   HGBA1C 5.9 06/13/2015   HGBA1C 5.8 12/12/2014   GLUCOSE 179 (H) 07/06/2017   GLUCOSE 138 (H) 12/13/2015   GLUCOSE 130 (H) 03/06/2015    Last seen for for this2 years ago.  Management since that visit includes no changes. Current symptoms include Numbness and have been stable.  Weight trend: increasing steadily Prior visit with dietician: no Current diet: Healthy Current exercise: walking  Pt has a hard time secondary to foot and leg pain.   Pertinent Labs:    Component Value Date/Time   CHOL 129 08/13/2016   CHOL 219 (H) 12/13/2015 1011   TRIG 81 08/13/2016   CHOLHDL 4.9 12/13/2015 1011   CREATININE 0.93 07/06/2017 1705    Wt Readings from Last 3 Encounters:  03/24/18 183 lb (83 kg)  11/21/17 163 lb 6.4 oz (74.1 kg)  11/17/17 158 lb 9.6 oz (71.9 kg)       Hypertension, follow-up:  BP Readings from Last 3 Encounters:  03/24/18 (!) 156/91  11/21/17 112/64  11/17/17 104/67    He was last seen for hypertension 9 months ago.  BP at that visit was 120/80. Management since that visit includes No changes He reports excellent compliance with treatment. He is not having side effects.  He is exercising. He is adherent to low salt diet.   Marland Kitchen He is experiencing dyspnea and lower extremity edema.  Patient denies chest pain and syncope.   Cardiovascular risk factors include advanced age (older than 55 for men, 44 for women), dyslipidemia, hypertension and male gender.  Use of agents associated with hypertension: none.    ------------------------------------------------------------------------    Lipid/Cholesterol, Follow-up:   Last seen for this 1 years ago.  Management since that visit includes no changes.  Last Lipid Panel:    Component Value Date/Time   CHOL 129 08/13/2016   CHOL 219 (H) 12/13/2015 1011   TRIG 81 08/13/2016   HDL 49 08/13/2016   HDL 45 12/13/2015 1011   CHOLHDL 4.9 12/13/2015 1011   LDLCALC 63 08/13/2016   LDLCALC 147 (H) 12/13/2015 1011    He reports excellent compliance with treatment. He is not having side effects.   Wt Readings from Last 3 Encounters:  03/24/18 183 lb (83 kg)  11/21/17 163 lb 6.4 oz (74.1 kg)  11/17/17 158 lb 9.6 oz (71.9 kg)    ------------------------------------------------------------------------ Congestive heart failure Has been started on Entresto by Dr. Welton Flakes. His last echo on 12/02/2017 showed mild diffuse hypokinesis and EF improved from 35% to 43% and mild to moderte tricuspid and mitral regurgitaion  He has had a lot of right ankle pain with a bit of swelling around achilles tendon and said that Dr. Welton Flakes told he might have gout.   Allergies  Allergen Reactions  . Dilaudid [Hydromorphone Hcl] Other (See Comments)    According to patient's wife he had hallucinations after having dilaudid     Current Outpatient Medications:  .  ALPRAZolam (XANAX) 1 MG tablet, Take 1 tablet (1 mg total) by mouth at bedtime., Disp: 90 tablet, Rfl:  2 .  amiodarone (PACERONE) 200 MG tablet, Take 200 mg by mouth daily. , Disp: , Rfl: 0 .  carvedilol (COREG) 12.5 MG tablet, Take 12.5 mg by mouth 2 (two) times daily., Disp: , Rfl: 1 (BOTTLE IS EMPTY)  cyclobenzaprine (FLEXERIL) 5 MG tablet, Take 1 tablet (5 mg total) by mouth every 6 (six) hours as needed for muscle spasms., Disp: 60 tablet, Rfl: 3 .  ENTRESTO 24-26 MG, Take 2 tablets by mouth 2 (two) times daily. , Disp: , Rfl: 3 .  ibuprofen (ADVIL,MOTRIN) 200 MG tablet, Take 400 mg by mouth every 8  (eight) hours as needed (for pain.). , Disp: , Rfl:  .  Multiple Vitamin (MULTIVITAMIN WITH MINERALS) TABS tablet, Take 1 tablet by mouth daily. Centrum Silver Men 50+, Disp: , Rfl:  .  rosuvastatin (CRESTOR) 40 MG tablet, Take 40 mg by mouth daily., Disp: , Rfl:  .  torsemide (DEMADEX) 10 MG tablet, Take 10 mg by mouth daily., Disp: , Rfl:  .  warfarin (COUMADIN) 5 MG tablet, TAKE 1 TABLET EVERY DAY AS DIRECTED, Disp: , Rfl:    Review of Systems  Constitutional: Positive for unexpected weight change. Negative for activity change, appetite change, chills, diaphoresis, fatigue and fever.  Respiratory: Positive for shortness of breath (On going issues pt reports this is the same.). Negative for apnea, cough, choking, chest tightness, wheezing and stridor.   Cardiovascular: Positive for palpitations and leg swelling. Negative for chest pain.  Gastrointestinal: Negative.   Endocrine: Negative.   Musculoskeletal: Positive for arthralgias and back pain. Negative for gait problem, joint swelling, myalgias, neck pain and neck stiffness.       Pt is concern he may have gout   Neurological: Negative for dizziness, light-headedness and headaches.    Social History   Tobacco Use  . Smoking status: Former Smoker    Packs/day: 0.50    Years: 15.00    Pack years: 7.50    Last attempt to quit: 07/19/2017    Years since quitting: 0.6  . Smokeless tobacco: Never Used  . Tobacco comment: stopped smoking 10/2017.   Substance Use Topics  . Alcohol use: Yes    Alcohol/week: 0.0 standard drinks    Comment: occasional use      Objective:   BP (!) 156/91 (BP Location: Right Arm, Patient Position: Sitting, Cuff Size: Normal)   Pulse 69   Temp 98.1 F (36.7 C) (Oral)   Resp 16   Wt 183 lb (83 kg)   BMI 24.82 kg/m  Vitals:   03/24/18 1002  BP: (!) 156/91  Pulse: 69  Resp: 16  Temp: 98.1 F (36.7 C)  TempSrc: Oral  Weight: 183 lb (83 kg)     Physical Exam   General Appearance:    Alert,  cooperative, no distress  Eyes:    PERRL, conjunctiva/corneas clear, EOM's intact       Lungs:     Clear to auscultation bilaterally, respirations unlabored  Heart:    Regular rate and rhythm, III/VI systolic murmur throughout  Neurologic:   Awake, alert, oriented x 3. No apparent focal neurological           defect.       Results for orders placed or performed in visit on 03/24/18  POCT glycosylated hemoglobin (Hb A1C)  Result Value Ref Range   Hemoglobin A1C 5.8 (A) 4.0 - 5.6 %       Assessment & Plan    1. Essential hypertension Elevated today.  His bottle of carvedilol is empty, and now he is not sure if he is taking it. Advised to call dr. Milta DeitersKhan's office today to see if he is still supposed to be taking it and get refill if he is.  - Renal function panel  2. Hyperlipidemia, unspecified hyperlipidemia type He is tolerating rosuvastatin well with no adverse effects.  He has no insurance and has a bunch of 20mg  atorvastatin left over. Advised he could take 2 a day of these until gone, then go back to rosuvastatin    3. Prediabetes  - POCT glycosylated hemoglobin (Hb A1C)  4. Atrial fibrillation, unspecified type (HCC) Still on warfarin, but not had PT/INR checked in a few months.  - INR/PT  5. Systolic dysfunction Significant improvement in EF with last [place], but is very expensive, and he does not have insurance right now. He is applying for SSI.  - Referral to Chronic Care Management Services  6. Right ankle swelling  - Uric acid  7. S/P MVR (mitral valve repair)   8. Nonrheumatic mitral valve regurgitation   9. Tricuspid valve insufficiency, unspecified etiology     Mila Merryonald Fisher, MD  [facility]

## 2018-03-25 ENCOUNTER — Telehealth: Payer: Self-pay

## 2018-03-25 LAB — RENAL FUNCTION PANEL
Albumin: 4.1 g/dL (ref 3.8–4.9)
BUN / CREAT RATIO: 17 (ref 9–20)
BUN: 14 mg/dL (ref 6–24)
CALCIUM: 9.5 mg/dL (ref 8.7–10.2)
CO2: 24 mmol/L (ref 20–29)
CREATININE: 0.83 mg/dL (ref 0.76–1.27)
Chloride: 102 mmol/L (ref 96–106)
GFR calc Af Amer: 112 mL/min/{1.73_m2} (ref >59)
GFR, EST NON AFRICAN AMERICAN: 97 mL/min/{1.73_m2} (ref >59)
GLUCOSE: 90 mg/dL (ref 65–99)
PHOSPHORUS: 3.1 mg/dL (ref 2.8–4.1)
POTASSIUM: 4.9 mmol/L (ref 3.5–5.2)
SODIUM: 141 mmol/L (ref 134–144)

## 2018-03-25 LAB — PROTIME-INR
INR: 1 (ref 0.8–1.2)
Prothrombin Time: 10.6 s (ref 9.1–12.0)

## 2018-03-25 LAB — URIC ACID: URIC ACID: 6.1 mg/dL (ref 3.7–8.6)

## 2018-03-25 NOTE — Telephone Encounter (Signed)
Patient has been advised.  He states he is still on the Warfarin.  His last check with the Cardiologist was 02/02/18 and he said he did not go back for PT/INR again until March.  I advised him to call their office and let them know what the reading was in the event they want to increase his dose.  He is currently taking 5 mg daily.

## 2018-03-25 NOTE — Telephone Encounter (Signed)
-----   Message from Malva Limes, MD sent at 03/25/2018  8:03 AM EST ----- Uric acid level is normal. He doesn't have gout.  INR is 1.0, it doesn't look like he is taking warfarin.  Is he sure he is still taking it? He may not need it if he is staying in sinus rhythm. He needs to check with his cardiologist to see if he still needs to take it.

## 2018-03-26 ENCOUNTER — Ambulatory Visit: Payer: Self-pay | Admitting: Pharmacist

## 2018-03-26 DIAGNOSIS — I5022 Chronic systolic (congestive) heart failure: Secondary | ICD-10-CM

## 2018-03-26 DIAGNOSIS — I519 Heart disease, unspecified: Secondary | ICD-10-CM

## 2018-03-30 ENCOUNTER — Ambulatory Visit: Payer: Self-pay | Admitting: Pharmacist

## 2018-03-30 DIAGNOSIS — I519 Heart disease, unspecified: Secondary | ICD-10-CM

## 2018-03-30 DIAGNOSIS — I5022 Chronic systolic (congestive) heart failure: Secondary | ICD-10-CM

## 2018-03-30 DIAGNOSIS — I10 Essential (primary) hypertension: Secondary | ICD-10-CM

## 2018-03-30 DIAGNOSIS — I4891 Unspecified atrial fibrillation: Secondary | ICD-10-CM

## 2018-03-30 NOTE — Patient Instructions (Signed)
Samuel Woods was given information about Care Management services today including:  1. Case Management services includes personalized support from designated clinical staff supervised by his physician, including individualized plan of care and coordination with other care providers 2. 24/7 contact phone numbers for assistance for urgent and routine care needs. 3. The patient may stop case management services at any time by phone call to the office staff.  Patient agreed to services and verbal consent obtained.     Please bring ALL OF YOUR MEDICATIONS to your appointment. Thank you!  Please call a member of the CCM (Chronic Care Management) Team with any questions or case management needs:   Arthur Holms, BSN Nurse Care Coordinator  [phone]  Karalee Height, PharmD  Clinical Pharmacist  [phone]

## 2018-03-30 NOTE — Chronic Care Management (AMB) (Signed)
  Care Management   Note  03/30/2018 Name: Samuel Woods MRN: [id number] DOB: 1960-09-05  Samuel Woods is a 58 y.o. year old male who sees Fisher, Demetrios Isaacs, MD for primary care. Dr. Sherrie Mustache asked the CCM team to consult the patient for assistance with chronic disease management related to heart failure and medication assistance Sherryll Burger). Referral was placed 03/24/18. Telephone outreach to patient today to introduce care management services.   Plan: [name] that care management services would be helpful to him/her and verbal consent was obtained. I have scheduled an initial office visit for 03/30/18   [name] was given information about Care Management services today including:  1. Case Management services includes personalized support from designated clinical staff supervised by his physician, including individualized plan of care and coordination with other care providers 2. 24/7 contact phone numbers for assistance for urgent and routine care needs. 3. The patient may stop case management services at any time by phone call to the office staff.   Patient and CCM pharmacist briefly applied to [employer] via telephone for the heart failure fund (currently open). Unfortunately, [name] was denied from enrollment because it is necessary to be commercially insured. CCM pharmacist will move forward with manufacturer's medication assistance funds during office visit.  Karalee Height, PharmD Clinical Pharmacist [facility] [phone]    Follow Up Plan: Face to Face appointment with CCM team member scheduled for: 03/30/2018  Karalee Height, PharmD Clinical Pharmacist [facility] [phone]

## 2018-03-30 NOTE — Chronic Care Management (AMB) (Signed)
Care Management   Initial Visit Note  03/30/2018 Name: Samuel Woods MRN: [id number] DOB: 1960/04/16   Subjective: "My disability was denied and I need to find some type of income incase the appeal is denied" "I can't afford my medications" "I think my heart failure is gone since they shocked my heart back in rhythm"  Objective:  BP Readings from Last 3 Encounters:  03/24/18 (!) 156/91  11/21/17 112/64  11/17/17 104/67   Recent EF 43%   Assessment: Samuel Woods. Arb is a 58 year old male patient of Dr. Demetrios Isaacs. Sherrie Mustache how was referred to the Chronic Care Management Team for medication assistance for Entresto prescribed by Cardiologist. Samuel Woods is currently unemployed and in the appeals process for permanent disability. He has a history of but not limited to HTN, A-fib, Mitral and Tricuspid regurgitation, HDL, S/P MV repair, and HF.   Review of patient status, including review of consultants reports, relevant laboratory and other test results, and collaboration with appropriate care team members and the patient's provider was performed as part of comprehensive patient evaluation and provision of chronic care management services.     1 scheduled and 1 unscheduled hospitalization in the last 6 months  <no information>  Goals    . "Aren't all my medications doing the same things for my heart" (pt-stated)     Current Barriers:  Marland Kitchen Knowledge deficit surrounding purpose and mechanism of medications  Pharmacist Clinical Goal(s): Over the next 30 days, Mr. Rotman will verbalize understanding of medication plan as it relates to his plan of care.   Interventions: . Patient educated on purpose, proper use and potential adverse effects of warfarin, Eliquis, Entresto, amiodarone, carvedilol.   . Patient provided with an easy to understand medication guide made by CCM pharmacist  Patient Self Care Activities:  . Take all medications as prescribed . Contact provider for samples if  prescription of Sherryll Burger will run out before enrollment in assistance program  *initial goal documentation     . "My initial disability was denied and I have no income at this time" (pt-stated)     Current Barriers:  Marland Kitchen Knowledge deficit related to community resources available for reentering workforce with heart disease . financial  Nurse Case Manager Clinical Goal(s): Over the next 30 days, patient will explore vocational rehab services  Interventions:  . Spoke with Liborio Nixon at [employer] to understand resources  . Provided patient with contact information with [facility]ocational [facility] o [phone]  Patient Self Care Activities:  . Engage with Vocational Rehab  *initial goal documentation     . Having heartfailure is scary and I don't understand if it can be managed" (pt-stated)     Current Barriers:  Marland Kitchen Knowledge deficit related to management of Heart Failiure  Nurse Case Manager Clinical Goal(s): Over the next 14 days, patient will engage in self care activities related to heart failure management (daily weight, low sodium diet, understanding HF action plan  Interventions:  . Basic HF education including basic pathophysiology of heart failure, causes, effects on daily life, diet, exercise, and management of symptoms  Patient Self Care Activities:  . Weigh self daily and record . Maintain low sodium diet . Follow HF action plan (green, yellow, red zones) . Take medications as prescribed . Monitor and record BP  *initial goal documentation     . I can't afford my medications (pt-stated)     Current Barriers:  . financial  Pharmacist Clinical Goal(s): Over the next 14  days, Mr.. Renaldo Woods will provide the necessary supplementary documents (proof of out of pocket prescription expenditure, proof of household income) needed for medication assistance applications to CCM pharmacist.   Interventions: . CCM pharmacist will apply for medication assistance program for Entresto made  by [employer].  . CCM pharmacist will contact Dr. Welton FlakesKhan about Eliquis over warfarin for atrial fibrillation management  Patient Self Care Activities:  Marland Kitchen. Gather necessary documents needed to apply for medication assistance  *initial goal documentation         Follow up plan:  Telephone follow up appointment with CCM team member scheduled for: 14 days  Mr. Renaldo Woods was given information about Care Management services today including:  1. Case Management services include personalized support from designated clinical staff supervised by a physician, including individualized plan of care and coordination with other care providers 2. 24/7 contact phone numbers for assistance for urgent and routine care needs. 3. The patient may stop CCM services at any time (effective at the end of the month) by phone call to the office staff.  Patient agreed to services and verbal consent obtained.  Maryanne Huneycutt E. Suzie PortelaPayne, RN, BSN Nurse Care Coordinator [facility] Care Management [phone]

## 2018-03-30 NOTE — Chronic Care Management (AMB) (Signed)
Chronic Care Management   Note  03/30/2018 Name: Samuel Woods MRN: [id number] DOB: November 30, 1960  Subjective:   Does the patient  feel that his/her medications are working for him/her?  yes  Has the patient been experiencing any side effects to the medications prescribed?  Yes- bleeding when shaving (warfarin)  Does the patient measure his/her own blood glucose at home?  no   Does the patient measure his/her own blood pressure at home? yes   Does the patient have any problems obtaining medications due to transportation or finances?   yes  Understanding of regimen: good Understanding of indications: poor Potential of compliance: good  Objective: Lab Results  Component Value Date   CREATININE 0.83 03/24/2018   CREATININE 0.93 07/06/2017   CREATININE 0.93 07/06/2017    Lab Results  Component Value Date   HGBA1C 5.8 (A) 03/24/2018    Lipid Panel     Component Value Date/Time   CHOL 129 08/13/2016   CHOL 219 (H) 12/13/2015 1011   TRIG 81 08/13/2016   HDL 49 08/13/2016   HDL 45 12/13/2015 1011   CHOLHDL 4.9 12/13/2015 1011   LDLCALC 63 08/13/2016   LDLCALC 147 (H) 12/13/2015 1011    BP Readings from Last 3 Encounters:  03/24/18 (!) 156/91  11/21/17 112/64  11/17/17 104/67    Allergies  Allergen Reactions  . Dilaudid [Hydromorphone Hcl] Other (See Comments)    According to patient's wife he had hallucinations after having dilaudid    Medications Reviewed Today    Reviewed by Samuel Woods, [facility] (Pharmacist) on 03/30/18 at 1307  Med List Status: <None>  Medication Order Taking? Sig Documenting Provider Last Dose Status Informant  ALPRAZolam (XANAX) 1 MG tablet [id number] Yes Take 1 tablet (1 mg total) by mouth at bedtime. Malva Limes, MD Taking Active   amiodarone (PACERONE) 200 MG tablet [id number] Yes Take 200 mg by mouth daily.  [provider] Taking Active Self  carvedilol (COREG) 12.5 MG tablet [id number] Yes Take 12.5 mg by mouth 2 (two) times  daily. [provider] Taking Active Self  cyclobenzaprine (FLEXERIL) 5 MG tablet [id number] Yes Take 1 tablet (5 mg total) by mouth every 6 (six) hours as needed for muscle spasms. Malva Limes, MD Taking Active            Med Note Burnett Sheng, Shirl Harris Mar 30, 2018  1:07 PM) Once daily  ENTRESTO 24-26 MG [id number] Yes Take 2 tablets by mouth 2 (two) times daily.  [provider] Taking Active Self  ibuprofen (ADVIL,MOTRIN) 200 MG tablet [id number] Yes Take 400 mg by mouth every 8 (eight) hours as needed (for pain.).  [provider] Taking Active Self           Med Note Burnett Sheng, Shirl Harris Mar 30, 2018  1:07 PM) At least once daily  Multiple Vitamin (MULTIVITAMIN WITH MINERALS) TABS tablet [id number] Yes Take 1 tablet by mouth daily. Centrum Silver Men 50+ [provider] Taking Active Self  rosuvastatin (CRESTOR) 40 MG tablet [id number] Yes Take 40 mg by mouth daily. [provider] Taking Active   torsemide (DEMADEX) 10 MG tablet [id number] Yes Take 10 mg by mouth daily. [provider] Taking Active   warfarin (COUMADIN) 5 MG tablet [id number] Yes TAKE 1 TABLET EVERY DAY AS DIRECTED [provider] Taking Active            Assessment:   Total Number of meds:  10 (polypharmacy > 10 meds)  Indications for all medications: [x]  Yes       []  No  Adherence Review  []  Excellent (no doses missed/week)     [x]  Good (no more than 1 dose missed/week)     []  Partial (2-3 doses missed/week)     []  Poor (>3 doses missed/week)  Intervention  YES NO  Explanation   Safety/Adverse Med Event      Contraindication present [x]  []  Warfarin- amiodarone: concurrent administration raises INR (increased bleeding risk); occurs over time due to long half life of amiodarone   Unsafe medication [x]  []  Warfarin- NTI   Allergic reaction []  []     Drug interaction [x]  []  Warfarin- amiodarone: concurrent administration raises INR (increased  bleeding risk); occurs over time due to long half life of amiodarone   Excessive dose/duration []  []     Undesirable side effect []  []     Adherence      Cannot afford medication [x]  []  Entresto   Different drug needed      Condition refractory to medication []  []     More effective medication available [x]  []  Warfarin --> Eliquis   More cost-effective medication available [x]  []  Patient could enroll in [place] medication assistance ([employer])   Other pertinent pharmacist  counseling      Goals Addressed            This Visit's Progress   . "Aren't all my medications doing the same things for my heart" (pt-stated)       Current Barriers:  Marland Kitchen. Knowledge deficit surrounding purpose and mechanism of medications  Pharmacist Clinical Goal(s): Over the next 30 days, Mr. Samuel Fiddlerdkins will verbalize understanding of medication plan as it relates to his plan of care.   Interventions: . Patient educated on purpose, proper use and potential adverse effects of warfarin, Eliquis, Entresto, amiodarone, carvedilol.   . Patient provided with an easy to understand medication guide made by CCM pharmacist  Patient Self Care Activities:  . Take all medications as prescribed . Contact provider for samples if prescription of Sherryll Burgerntresto will run out before enrollment in assistance program  *initial goal documentation     . "My initial disability was denied and I have no income at this time" (pt-stated)       Current Barriers:  Marland Kitchen. Knowledge deficit related to community resources available for reentering workforce with heart disease . financial  Nurse Case Manager Clinical Goal(s): Over the next 30 days, patient will explore vocational rehab services  Interventions:  . [facility]Janice [facility] [employer] [facility] [employer] to understand resources  . Provided patient with contact information with Vocational Rehab o [phone]  Patient Self Care Activities:  . Engage with Vocational Rehab  *initial goal documentation     .  Having heartfailure is scary and I don't understand if it can be managed" (pt-stated)       Current Barriers:  Marland Kitchen. Knowledge deficit related to management of Heart Failiure  Nurse Case Manager Clinical Goal(s): Over the next 14 days, patient will engage in self care activities related to heart failure management (daily weight, low sodium diet, understanding HF action plan  Interventions:  . Basic HF education including basic pathophysiology of heart failure, causes, effects on daily life, diet, exercise, and management of symptoms  Patient Self Care Activities:  . Weigh self daily and record . Maintain low sodium diet . Follow HF action plan (green, yellow, red zones) . Take medications as prescribed . Monitor and record BP  *  initial goal documentation     . I can't afford my medications (pt-stated)       Current Barriers:  . financial  Pharmacist Clinical Goal(s): Over the next 14 days, Mr.. Eadie will provide the necessary supplementary documents (proof of out of pocket prescription expenditure, proof of household income) needed for medication assistance applications to CCM pharmacist.   Interventions: . CCM pharmacist will apply for medication assistance program for Entresto made by [employer].  . CCM pharmacist will contact Dr. Welton Flakes about Eliquis over warfarin for atrial fibrillation management  Patient Self Care Activities:  Marland Kitchen Gather necessary documents needed to apply for medication assistance  *initial goal documentation         Plan: Recommendations discussed with provider (Dr. Welton Flakes)  - Change warfarin to Eliquis for better tolerance, decreased drug-drug interaction with amiodarone   Karalee Height, PharmD Clinical Pharmacist [facility] [phone]

## 2018-03-30 NOTE — Patient Instructions (Addendum)
1. Please take all medications as prescribed. Thank you for bringing in the necessary documents for medication assistance applications. Please let Dr. Welton Flakes know if you need more samples of Entresto  2. Try to stay below 2,000 mg of sodium daily- this is can be hard! More information on the "DASH" diet is below.   3. Weight daily and record  4. Exercise 3 times a week (walking on level surface) for 30-45 min using fairly light to somewhat hard self eval tool  5. Take BP daily and record  6. Contact Vocational Rehab for employment options [phone]  Thank you allowing the Chronic Care Management Team to be a part of your care!   Please call a member of the CCM (Chronic Care Management) Team with any questions or case management needs:   Arthur Holms, BSN Nurse Care Coordinator  [phone]  Karalee Height, PharmD  Clinical Pharmacist  [phone]  Goals Addressed            This Visit's Progress   . "Aren't all my medications doing the same things for my heart" (pt-stated)       Current Barriers:  Marland Kitchen Knowledge deficit surrounding purpose and mechanism of medications  Pharmacist Clinical Goal(s): Over the next 30 days, Mr. Webb will verbalize understanding of medication plan as it relates to his plan of care.   Interventions: . Patient educated on purpose, proper use and potential adverse effects of warfarin, Eliquis, Entresto, amiodarone, carvedilol.   . Patient provided with an easy to understand medication guide made by CCM pharmacist  Patient Self Care Activities:  . Take all medications as prescribed . Contact provider for samples if prescription of Sherryll Burger will run out before enrollment in assistance program  *initial goal documentation     . "My initial disability was denied and I have no income at this time" (pt-stated)       Current Barriers:  Marland Kitchen Knowledge deficit related to community resources available for reentering workforce with heart  disease . financial  Nurse Case Manager Clinical Goal(s): Over the next 30 days, patient will explore vocational rehab services  Interventions:  . Spoke with Liborio Nixon at [employer] to understand resources  . Provided patient with contact information with Vocational Rehab o [phone]  Patient Self Care Activities:  . Engage with Vocational Rehab  *initial goal documentation     . Having heartfailure is scary and I don't understand if it can be managed" (pt-stated)       Current Barriers:  Marland Kitchen Knowledge deficit related to management of Heart Failiure  Nurse Case Manager Clinical Goal(s): Over the next 14 days, patient will engage in self care activities related to heart failure management (daily weight, low sodium diet, understanding HF action plan  Interventions:  . Basic HF education including basic pathophysiology of heart failure, causes, effects on daily life, diet, exercise, and management of symptoms  Patient Self Care Activities:  . Weigh self daily and record . Maintain low sodium diet . Follow HF action plan (green, yellow, red zones) . Take medications as prescribed . Monitor and record BP  *initial goal documentation     . I can't afford my medications (pt-stated)       Current Barriers:  . financial  Pharmacist Clinical Goal(s): Over the next 14 days, Mr.. Rousselle will provide the necessary supplementary documents (proof of out of pocket prescription expenditure, proof of household income) needed for medication assistance applications to CCM pharmacist.   Interventions: .  CCM pharmacist will apply for medication assistance program for Entresto made by [employer].  . CCM pharmacist will contact Dr. Welton Flakes about Eliquis over warfarin for atrial fibrillation management  Patient Self Care Activities:  Marland Kitchen Gather necessary documents needed to apply for medication assistance  *initial goal documentation           DASH Eating Plan DASH stands for "Dietary  Approaches to Stop Hypertension." The DASH eating plan is a healthy eating plan that has been shown to reduce high blood pressure (hypertension). It may also reduce your risk for type 2 diabetes, heart disease, and stroke. The DASH eating plan may also help with weight loss. What are tips for following this plan?   General guidelines  Avoid eating more than 2,300 mg (milligrams) of salt (sodium) a day. If you have hypertension, you may need to reduce your sodium intake to 1,500 mg a day.  Limit alcohol intake to no more than 1 drink a day for nonpregnant women and 2 drinks a day for men. One drink equals 12 oz of beer, 5 oz of wine, or 1 oz of hard liquor.  Work with your health care provider to maintain a healthy body weight or to lose weight. Ask what an ideal weight is for you.  Get at least 30 minutes of exercise that causes your heart to beat faster (aerobic exercise) most days of the week. Activities may include walking, swimming, or biking.  Work with your health care provider or diet and nutrition specialist (dietitian) to adjust your eating plan to your individual calorie needs. Reading food labels    Check food labels for the amount of sodium per serving. Choose foods with less than 5 percent of the Daily Value of sodium. Generally, foods with less than 300 mg of sodium per serving fit into this eating plan.  To find whole grains, look for the word "whole" as the first word in the ingredient list. Shopping  Buy products labeled as "low-sodium" or "no salt added."  Buy fresh foods. Avoid canned foods and premade or frozen meals. Cooking  Avoid adding salt when cooking. Use salt-free seasonings or herbs instead of table salt or sea salt. Check with your health care provider or pharmacist before using salt substitutes.  Do not fry foods. Cook foods using healthy methods such as baking, boiling, grilling, and broiling instead.  Cook with heart-healthy oils, such as olive,  canola, soybean, or sunflower oil. Meal planning  Eat a balanced diet that includes: ? 5 or more servings of fruits and vegetables each day. At each meal, try to fill half of your plate with fruits and vegetables. ? Up to 6-8 servings of whole grains each day. ? Less than 6 oz of lean meat, poultry, or fish each day. A 3-oz serving of meat is about the same size as a deck of cards. One egg equals 1 oz. ? 2 servings of low-fat dairy each day. ? A serving of nuts, seeds, or beans 5 times each week. ? Heart-healthy fats. Healthy fats called Omega-3 fatty acids are found in foods such as flaxseeds and coldwater fish, like sardines, salmon, and mackerel.  Limit how much you eat of the following: ? Canned or prepackaged foods. ? Food that is high in trans fat, such as fried foods. ? Food that is high in saturated fat, such as fatty meat. ? Sweets, desserts, sugary drinks, and other foods with added sugar. ? Full-fat dairy products.  Do not salt foods before  eating.  Try to eat at least 2 vegetarian meals each week.  Eat more home-cooked food and less restaurant, buffet, and fast food.  When eating at a restaurant, ask that your food be prepared with less salt or no salt, if possible. What foods are recommended? The items listed may not be a complete list. Talk with your dietitian about what dietary choices are best for you. Grains Whole-grain or whole-wheat bread. Whole-grain or whole-wheat pasta. Brown rice. Orpah Cobbatmeal. Quinoa. Bulgur. Whole-grain and low-sodium cereals. Pita bread. Low-fat, low-sodium crackers. Whole-wheat flour tortillas. Vegetables Fresh or frozen vegetables (raw, steamed, roasted, or grilled). Low-sodium or reduced-sodium tomato and vegetable juice. Low-sodium or reduced-sodium tomato sauce and tomato paste. Low-sodium or reduced-sodium canned vegetables. Fruits All fresh, dried, or frozen fruit. Canned fruit in natural juice (without added sugar). Meat and other protein  foods Skinless chicken or Malawiturkey. Ground chicken or Mal[place]iturkey. Pork with fat trimmed off. Fish and seafood. Egg whites. Dried beans, peas, or lentils. Unsalted nuts, nut butters, and seeds. Unsalted canned beans. Lean cuts of beef with fat trimmed off. Low-sodium, lean deli meat. Dairy Low-fat (1%) or fat-free (skim) milk. Fat-free, low-fat, or reduced-fat cheeses. Nonfat, low-sodium ricotta or cottage cheese. Low-fat or nonfat yogurt. Low-fat, low-sodium cheese. Fats and oils Soft margarine without trans fats. Vegetable oil. Low-fat, reduced-fat, or light mayonnaise and salad dressings (reduced-sodium). Canola, safflower, olive, soybean, and sunflower oils. Avocado. Seasoning and other foods Herbs. Spices. Seasoning mixes without salt. Unsalted popcorn and pretzels. Fat-free sweets. What foods are not recommended? The items listed may not be a complete list. Talk with your dietitian about what dietary choices are best for you. Grains Baked goods made with fat, such as croissants, muffins, or some breads. Dry pasta or rice meal packs. Vegetables Creamed or fried vegetables. Vegetables in a cheese sauce. Regular canned vegetables (not low-sodium or reduced-sodium). Regular canned tomato sauce and paste (not low-sodium or reduced-sodium). Regular tomato and vegetable juice (not low-sodium or reduced-sodium). Rosita FirePickles. Olives. Fruits Canned fruit in a light or heavy syrup. Fried fruit. Fruit in cream or butter sauce. Meat and other protein foods Fatty cuts of meat. Ribs. Fried meat. Tomasa BlaseBacon. Sausage. Bologna and other processed lunch meats. Salami. Fatback. Hotdogs. Bratwurst. Salted nuts and seeds. Canned beans with added salt. Canned or smoked fish. Whole eggs or egg yolks. Chicken or Malawiturkey with skin. Dairy Whole or 2% milk, cream, and half-and-half. Whole or full-fat cream cheese. Whole-fat or sweetened yogurt. Full-fat cheese. Nondairy creamers. Whipped toppings. Processed cheese and cheese  spreads. Fats and oils Butter. Stick margarine. Lard. Shortening. Ghee. Bacon fat. Tropical oils, such as coconut, palm kernel, or palm oil. Seasoning and other foods Salted popcorn and pretzels. Onion salt, garlic salt, seasoned salt, table salt, and sea salt. Worcestershire sauce. Tartar sauce. Barbecue sauce. Teriyaki sauce. Soy sauce, including reduced-sodium. Steak sauce. Canned and packaged gravies. Fish sauce. Oyster sauce. Cocktail sauce. Horseradish that you find on the shelf. Ketchup. Mustard. Meat flavorings and tenderizers. Bouillon cubes. Hot sauce and Tabasco sauce. Premade or packaged marinades. Premade or packaged taco seasonings. Relishes. Regular salad dressings. Where to find more information:  National Heart, Lung, and Blood Institute: PopSteam.iswww.nhlbi.nih.gov  American Heart Association: www.heart.org Summary  The DASH eating plan is a healthy eating plan that has been shown to reduce high blood pressure (hypertension). It may also reduce your risk for type 2 diabetes, heart disease, and stroke.  With the DASH eating plan, you should limit salt (sodium) intake to 2,300 mg a  day. If you have hypertension, you may need to reduce your sodium intake to 1,500 mg a day.  When on the DASH eating plan, aim to eat more fresh fruits and vegetables, whole grains, lean proteins, low-fat dairy, and heart-healthy fats.  Work with your health care provider or diet and nutrition specialist (dietitian) to adjust your eating plan to your individual calorie needs. This information is not intended to replace advice given to you by your health care provider. Make sure you discuss any questions you have with your health care provider.   A-Fib ACTION PLAN Actions to Take if My Symptoms Get Worse   WHAT ZONE ARE YOU IN TODAY? Green, Yellow, or Red?    GREEN ZONE: This is your goal!  . No shortness of breath.  . No weakness.  . No dizziness. . No continuous racing of the heart.   YELLOW ZONE:  Call your doctor TODAY to get help!  You may have one or more of the following:  . Racing or irregular heart beat that my be uncomfortable. . Shortness of breath with or without chest pain.  . Weakness (unusually tired or feeling faint).  . Dizziness.    RED ZONE: EMERGENCY CALL 911!  Marland Kitchen. Increased chest pain that is continuous.  . Fainting or near fainting.  . Struggling to breathe.  . New confusion or can't think clearly.  . Symptoms of Stroke: F - Face Drooping                                         A - Arms drift downward when both arms are raised                                         S - Slurred Speech                                         T - Time to call 911 immediately.    Information about your medication: Entresto  Generic Name (Brand): Entresto (sabubitril- valsartan)  Sacubitril is a neprilysin inhibitor, which will increase heart healthy peptides that maintain heart function. Valsartan is an angiotensin receptor blocker (ARB), which will lower blood pressure by reducing angiotensin dependent aldosterone release. Together, these medications have been proven to work together in combination to reduce hospitalizations due to heart failure and cardiac related deaths.   Common SIDE EFFECTS you should be aware of include: small increase in potassium levels and kidney function markers; risk of low blood pressure (dizziness or feeling faint).   Take tablets as prescribed two times per day.   You and your health care provider may need to check blood pressure and kidney function regularly.

## 2018-04-06 ENCOUNTER — Ambulatory Visit: Payer: Self-pay | Admitting: Pharmacist

## 2018-04-06 ENCOUNTER — Ambulatory Visit: Payer: Self-pay

## 2018-04-06 DIAGNOSIS — I10 Essential (primary) hypertension: Secondary | ICD-10-CM

## 2018-04-06 DIAGNOSIS — F419 Anxiety disorder, unspecified: Secondary | ICD-10-CM

## 2018-04-06 DIAGNOSIS — I4891 Unspecified atrial fibrillation: Secondary | ICD-10-CM

## 2018-04-06 DIAGNOSIS — E785 Hyperlipidemia, unspecified: Secondary | ICD-10-CM

## 2018-04-06 DIAGNOSIS — I519 Heart disease, unspecified: Secondary | ICD-10-CM

## 2018-04-06 DIAGNOSIS — I5022 Chronic systolic (congestive) heart failure: Secondary | ICD-10-CM

## 2018-04-06 NOTE — Chronic Care Management (AMB) (Signed)
  Chronic Care Management   Follow Up Note   04/06/2018 Name: Samuel Woods MRN: [id number] DOB: 1960/05/21  Referred by: Malva Limes, MD Reason for referral : Care Coordination (Cardiology)   Subjective: Patient is self-pay, seeking medication assistance for [facility]   Assessment: Care coordination call with Valorie Roosevelt, NP in cardiology at [facility] (with Dr. Welton Flakes). Kristen reviewed patient's chart and agreed with recommendation of Eliquis as anticoagulation, especially in light of patient's recent INR reading of 1.0. I further recommended the switch to Eliquis due to the DDI with amiodarone. [name] has an appointment next month with Cardiology, but it seems may have missed some appointments. [facility] will contact [name] about coming in more quickly for a follow up. Both Eliquis and Entresto applications over to [employer] for prescriber to complete- uploaded into Media tab  Goals Addressed            This Visit's Progress   . I can't afford my medications (pt-stated)   On track    Current Barriers:  . financial  Pharmacist Clinical Goal(s): Over the next 14 days, [name] will provide the necessary supplementary documents (proof of out of pocket prescription expenditure, proof of household income) needed for medication assistance applications to CCM pharmacist.   Interventions: . CCM pharmacist will apply for medication assistance program for Entresto made by [employer]. . CCM pharmacist will contact Dr. Welton Flakes about Eliquis over warfarin for atrial fibrillation management . 04/06/18:  Valorie Roosevelt, NP at [facility] (with Dr. Welton Flakes) agrees with Eliquis recommendation. Patient needs appointment with [facility] o CCM pharmacist faxed Sherryll Burger and Eliquis medication assistance applications to [facility], attention Valorie Roosevelt  Patient Self Care Activities:  Marland Kitchen Gather necessary documents needed to apply for  medication assistance  Please see past updates related to this goal by clicking on the "Past Updates" button in the selected goal           Telephone follow up appointment with CCM team member scheduled for: 04/13/18 with RN CM and clinical pharmacist   Karalee Height, PharmD Clinical Pharmacist [facility] [phone]

## 2018-04-06 NOTE — Patient Instructions (Signed)
  Thank you allowing the Chronic Care Management Team to be a part of your care! It was a pleasure speaking with you today!  1. Elevate your legs while resting. Stay off of it as much as possible today. I am happy you have started walking daily!! 2. Consider ordering/purchasing compression stocking. Apply as directed below. a. Make sure you apply your compression stocking AFTER you have had your legs elevated for a few minutes b. If you have trouble getting them on, they make assistive devices for application. These can be purchased at a medical supply store. c. Make sure you are removing your compression socks prior to bedtime. DO NOT SLEEP IN THEM 3. Please make a PCP appointment tomorrow if you do not see improvement with elevation or your symptoms worsen  CCM (Chronic Care Management) Team   Yvone Neu RN, BSN Nurse Care Coordinator  [phone]  Karalee Height PharmD  Clinical Pharmacist  [phone]   Goals Addressed            This Visit's Progress   . "my left ankle is purple and cool to touch" (pt-stated)       Current Barriers:  Marland Kitchen Knowledge Deficits related to potential causes of discoloration in lower legs* .   Nurse Case Manager Clinical Goal(s):  Marland Kitchen Over the next 24 hours patient will report improvement in ankle discoloration and temperature  . Over the next 24 hours patient will report purchasing compression stocking . Over the next 24 hours patient will make appointment to be seen by PCP if symptoms are not improved  Interventions:  . Provided education on potential causes of ankle swelling/discoloration . Assessed for s/s of HF . Provided patient with verbal instructions on proper application of compression stocking . Discussed importance of lower extremity elevation   Patient Self Care Activities:  . Report condition to RN CM tomorrow . Purchase compression stocking and apply correctly . Elevate lower extremities when at rest . Request PCP  appointment within 24 hours if symptoms worsen  Plan:  . CCM RN CM will follow up with patient tomorrow  *initial goal documentation        The patient verbalized understanding of instructions provided today and declined a print copy of patient instruction materials.   Telephone follow up appointment with CCM team member scheduled for: tomorrow

## 2018-04-06 NOTE — Patient Instructions (Signed)
1. Thank you for bringing in all of your financial documents for your medication assistance applications! Raynelle Fanning has faxed the prescriber portions over to Dr. Welton Flakes and Valorie Roosevelt.    2. Dr. Milta Deiters office will contact you for a follow up appointment and to discuss transitioning to Eliquis.    Thank you allowing the Chronic Care Management Team to be a part of your care!   Please call a member of the CCM (Chronic Care Management) Team with any questions or case management needs:   Arthur Holms, BSN Nurse Care Coordinator  [phone]  Karalee Height, PharmD  Clinical Pharmacist  [phone]  Information about your medication: Anticoagulant  Generic Name (Brand): Apixaban (Eliquis)  Apixaban is used to reduce the risk of forming blood clots that cause a stroke due to an irregular heartbeat.  Common SIDE EFFECTS you may experience include: extremity pain and increased risk of bleeding or bruising.  This drug must be taken consistently as prescribed to maintain its effect.  Multiple drug-drug interactions exist for this medication.  Consult healthcare professional prior to starting a new drug.  Tell your physicians and dentists that you are taking this drug before elective surgery or invasive procedures and before any new drug is prescribed.  Contact your health care provider if you experience: any signs of blood loss or unusual bleeding.  The patient verbalized understanding of instructions provided today and declined a print copy of patient instruction materials.

## 2018-04-06 NOTE — Chronic Care Management (AMB) (Signed)
  Care Management   Note   04/06/2018 Name: Samuel Woods MRN: [id number] DOB: Aug 10, 1960  Referred by: Samuel Sons, MD Reason for referral : Chronic Care Management (incoming call related to ankle swelling)    Subjective: "I have been walking every day like you said but yesterday I noticed by deformed ankle was turning purple and cool to touch; Almost like varicose veins"   Objective:    Assessment: [name] is a 58 year old male patient of Dr. Kirstie Woods. Caryn Section how was referred to the Chronic Care Management Team for medication assistance for Entresto prescribed by Cardiologist. Samuel Woods is currently unemployed and in the appeals process for permanent disability. He has a history of but not limited to HTN, A-fib, Mitral and Tricuspid regurgitation, HDL, S/P MV repair, and HF. He met with the CCM team to establish health goals on 03/30/2018. Today he calls concerning left ankle discoloration and cool temperature.  Review of patient status, including review of consultants reports, relevant laboratory and other test results, and collaboration with appropriate care team members and the patient's provider was performed as part of comprehensive patient evaluation and provision of chronic care management services.    Goals Addressed            This Visit's Progress   . "my left ankle is purple and cool to touch" (pt-stated)       Current Barriers:  Marland Kitchen Knowledge Deficits related to potential causes of discoloration in lower legs* .   Nurse Case Manager Clinical Goal(s):  Marland Kitchen Over the next 24 hours patient will report improvement in ankle discoloration and temperature  . Over the next 24 hours patient will report purchasing compression stocking . Over the next 24 hours patient will make appointment to be seen by PCP if symptoms are not improved  Interventions:  . Provided education on potential causes of ankle swelling/discoloration . Assessed for s/s of HF . Provided  patient with verbal instructions on proper application of compression stocking . Discussed importance of lower extremity elevation   Patient Self Care Activities:  . Report condition to RN CM tomorrow . Purchase compression stocking and apply correctly . Elevate lower extremities when at rest . Request PCP appointment within 24 hours if symptoms worsen  Plan:  . CCM RN CM will follow up with patient tomorrow  *initial goal documentation         Follow Up Plan: Will follow up with patient tomorrow   Jena Tegeler E. Rollene Rotunda, RN, BSN Nurse Care Coordinator [facility] Care Management [phone]

## 2018-04-07 ENCOUNTER — Ambulatory Visit: Payer: Self-pay

## 2018-04-07 DIAGNOSIS — I10 Essential (primary) hypertension: Secondary | ICD-10-CM

## 2018-04-07 DIAGNOSIS — I5022 Chronic systolic (congestive) heart failure: Secondary | ICD-10-CM

## 2018-04-07 NOTE — Patient Instructions (Signed)
Thank you allowing the Chronic Care Management Team to be a part of your care! It was a pleasure speaking with you today!  1. Elevate your legs while resting. Stay off of it as much as possible today. I am happy you have started walking daily!! 2. Consider ordering/purchasing compression stocking. Apply as directed below. a. Make sure you apply your compression stocking AFTER you have had your legs elevated for a few minutes b. If you have trouble getting them on, they make assistive devices for application. These can be purchased at a medical supply store. c. Make sure you are removing your compression socks prior to bedtime. DO NOT SLEEP IN THEM 3. Please make a PCP appointment tomorrow if you do not see improvement with elevation or your symptoms worsen 4. Follow up with your cardiologist as scheduled on Thursday. 5. The CCM Team will follow up with you as scheduled on 04/13/2018. Please call us as needed.  CCM (Chronic Care Management) Team   Yvone Neu RN, BSN Nurse Care Coordinator  [phone]  Karalee Height PharmD  Clinical Pharmacist  [phone]  Goals Addressed            This Visit's Progress   . "my left ankle is purple and cool to touch" (pt-stated)   On track    Current Barriers:  Marland Kitchen Knowledge Deficits related to potential causes of discoloration in lower legs* .   Nurse Case Manager Clinical Goal(s):  Marland Kitchen Over the next 24 hours patient will report improvement in ankle discoloration and temperature  . Over the next 24 hours patient will report purchasing compression stocking . Over the next 24 hours patient will make appointment to be seen by PCP if symptoms are not improved  Interventions:  . Provided education on potential causes of ankle swelling/discoloration . Assessed for s/s of HF . Provided patient with verbal instructions on proper application of compression stocking . Discussed importance of lower extremity elevation   Patient Self Care  Activities:  . Report condition to RN CM tomorrow . Purchase compression stocking and apply correctly . Elevate lower extremities when at rest . Request PCP appointment within 24 hours if symptoms worsen  Plan:  . CCM RN CM will follow up with patient tomorrow  *initial goal documentation

## 2018-04-07 NOTE — Chronic Care Management (AMB) (Signed)
  Care Management   Follow Up Note   04/07/2018 Name: Samuel Woods MRN: [id number] DOB: 07-24-1960  Referred by: Birdie Sons, MD Reason for referral : Chronic Care Management (follow up ankle swelling)    Subjective: "I think it is better today but they have moved up my Cardiology appointment to this Thursday"   Objective:   Assessment: Mr. Samuel Brumett. Woods is a 57 year old male patient of Dr. Kirstie Peri. Caryn Section how was referred to the Chronic Care Management Team for medication assistance for Entresto prescribed by Cardiologist. Samuel Woods is currently unemployed and in the appeals process for permanent disability. He has a history of but not limited to HTN, A-fib, Mitral and Tricuspid regurgitation, HDL, S/P MV repair, and HF. He met with the CCM team to establish health goals on 03/30/2018.  Samuel Woods called CCM Team yesterday, concerned about left ankle discoloration and temperature and goal was established. Today CCM RN CM follow up with Mr. Godwin to assess status of ankle after patient was provided with interventional care.  Review of patient status, including review of consultants reports, relevant laboratory and other test results, and collaboration with appropriate care team members and the patient's provider was performed as part of comprehensive patient evaluation and provision of chronic care management services.    Goals Addressed            This Visit's Progress   . "my left ankle is purple and cool to touch" (pt-stated)   On track    [name] states his ankle feels better today. Not as discolored, swollen, or cool to touch. It is now tender with ambulation. He has ordered compression stockings and they will arrive today. He is very disappointment that he has had to cease his daily exercise. He feels his left ankle discomfort is related to an orthopedic condition and not cardiac however he has moved his appointment to this Thursday for additional assessment. Samuel Woods  denies additional swelling in his right leg, abdomen or hands. He denies shortness of breath and orthopnea. He is limiting the amount of sodium in his diet and taking his medications as prescribed. His morning BP is reported as 132/75.  Current Barriers:  Samuel Woods Knowledge Deficits related to potential causes of discoloration in lower legs*   Nurse Case Manager Clinical Goal(s):  Samuel Woods Over the next 24 hours patient will report improvement in ankle discoloration and temperature  . Over the next 24 hours patient will report purchasing compression stocking . Over the next 24 hours patient will make appointment to be seen by PCP if symptoms are not improved  Interventions:  . Provided education on potential causes of ankle swelling/discoloration . Assessed for s/s of HF . Provided patient with verbal instructions on proper application of compression stocking . Discussed importance of lower extremity elevation   Patient Self Care Activities:  . Report condition to RN CM tomorrow . Purchase compression stocking and apply correctly . Elevate lower extremities when at rest . Request PCP appointment within 24 hours if symptoms worsen  Plan:  . CCM RN CM will follow up with patient tomorrow  *initial goal documentation     Follow Up Plan: CCM Team will follow up with patient as scheduled 04/13/2018   [name] E. Rollene Rotunda, RN, BSN Nurse Care Coordinator [facility] Care Management [phone]

## 2018-04-13 ENCOUNTER — Ambulatory Visit: Payer: Self-pay | Admitting: Pharmacist

## 2018-04-13 ENCOUNTER — Telehealth: Payer: Self-pay

## 2018-04-13 DIAGNOSIS — I10 Essential (primary) hypertension: Secondary | ICD-10-CM

## 2018-04-13 DIAGNOSIS — I5022 Chronic systolic (congestive) heart failure: Secondary | ICD-10-CM

## 2018-04-13 DIAGNOSIS — I4891 Unspecified atrial fibrillation: Secondary | ICD-10-CM

## 2018-04-13 NOTE — Chronic Care Management (AMB) (Signed)
Care Management   Follow Up Note   04/13/2018 Name: BENJMAN FREMONT MRN: [id number] DOB: 23-Mar-1960  Referred by: Malva Limes, MD Reason for referral : Chronic Care Management (follow up CHF/medication)   Subjective: Mr. Nazeer Tinker is a 58 year old male patient of Dr. Mila Merry. Followed by Care Management team for medication management, medication assistance, and chronic disease management for heart failure.   Successful follow up joint outreach today with RNCM regarding medication assistance, new Eliquis prescription following cardiology appointment last week, and foot swelling patient was experiencing last week. HIPAA identifiers verified.   Objective:  Medications Reviewed Today    Reviewed by Andee Poles, [facility] (Pharmacist) on 03/30/18 at 1307  Med List Status: <None>  Medication Order Taking? Sig Documenting Provider Last Dose Status Informant  ALPRAZolam (XANAX) 1 MG tablet [id number] Yes Take 1 tablet (1 mg total) by mouth at bedtime. Malva Limes, MD Taking Active   amiodarone (PACERONE) 200 MG tablet [id number] Yes Take 200 mg by mouth daily.  [provider] Taking Active Self  carvedilol (COREG) 12.5 MG tablet [id number] Yes Take 12.5 mg by mouth 2 (two) times daily. [provider] Taking Active Self  cyclobenzaprine (FLEXERIL) 5 MG tablet [id number] Yes Take 1 tablet (5 mg total) by mouth every 6 (six) hours as needed for muscle spasms. Malva Limes, MD Taking Active            Med Note Burnett Sheng, Shirl Harris Mar 30, 2018  1:07 PM) Once daily  ENTRESTO 24-26 MG [id number] Yes Take 2 tablets by mouth 2 (two) times daily.  [provider] Taking Active Self  ibuprofen (ADVIL,MOTRIN) 200 MG tablet [id number] Yes Take 400 mg by mouth every 8 (eight) hours as needed (for pain.).  [provider] Taking Active Self           Med Note Burnett Sheng, Shirl Harris Mar 30, 2018  1:07 PM) At least once daily  Multiple Vitamin (MULTIVITAMIN  WITH MINERALS) TABS tablet [id number] Yes Take 1 tablet by mouth daily. Centrum Silver Men 50+ [provider] Taking Active Self  rosuvastatin (CRESTOR) 40 MG tablet [id number] Yes Take 40 mg by mouth daily. [provider] Taking Active   torsemide (DEMADEX) 10 MG tablet [id number] Yes Take 10 mg by mouth daily. [provider] Taking Active   warfarin (COUMADIN) 5 MG tablet [id number] Yes TAKE 1 TABLET EVERY DAY AS DIRECTED [provider] Taking Active           Assessment:   Updated medication list to reflect discontinuation of warfarin and initiation of Eliquis. Patient counseled on proper use, administration, and potential adverse effects of Eliquis.   Medication assistance applications for Raechel Chute faxed to [facility] on 04/06/18. Will follow up with patient as companies will notify [facility] of application status and not [facility].   Patient unable to refill alprazolam 1mg  tablet due to new SIG when medication was refilled on 02/16/18 (see telephone note)  Goals Addressed            This Visit's Progress   . "Aren't all my medications doing the same things for my heart" (pt-stated)   On track    Current Barriers:  Marland Kitchen Knowledge deficit surrounding purpose and mechanism of medications  Pharmacist Clinical Goal(s): Over the next 30 days, Mr. Castaldo will verbalize understanding of medication plan as it relates to his plan of care.  Interventions: . Patient educated on purpose, proper use and potential adverse effects of warfarin, Eliquis, Entresto, amiodarone, carvedilol.   . Patient provided with an easy to understand medication guide made by CCM pharmacist  Update 04/13/18: Patient discontinued warfarin 04/09/18 and initiated Eliquis 04/11/18. Patient educated on purpose, proper use and potential adverse effects of Eliquis.     Patient Self Care Activities:  . Take all medications as prescribed . Contact  provider for samples if prescription of Entresto will run out before enrollment in assistance program  Please see past updates related to this goal by clicking on the "Past Updates" button in the selected goal      . I can't afford my medications (pt-stated)       Current Barriers:  . financial  Pharmacist Clinical Goal(s): Over the next 14 days, Mr.. Litaker will provide the necessary supplementary documents (proof of out of pocket prescription expenditure, proof of household income) needed for medication assistance applications to CCM pharmacist.   Interventions: . CCM pharmacist will apply for medication assistance program for Entresto made by [employer]. . CCM pharmacist will contact Dr. Welton Flakes about Eliquis over warfarin for atrial fibrillation management . 04/06/18:  Valorie Roosevelt, NP at [facility] (with Dr. Welton Flakes) agrees with Eliquis recommendation. Patient needs appointment with [facility] o CCM pharmacist faxed Sherryll Burger and Eliquis medication assistance applications to [facility], attention Valorie Roosevelt o Follow up as necessary with [facility] and patient  Patient Self Care Activities:  Marland Kitchen Gather necessary documents needed to apply for medication assistance  Please see past updates related to this goal by clicking on the "Past Updates" button in the selected goal          Recommendations Discussed with Provider:  Patient unable to refill alprazolam 1mg  tablet due to new SIG when medication was refilled on 02/16/18 (see telephone note)- Mr. Newman is enquiring about this? Prescription was written for 90 days so pharmacy will not fill as it is too soon. Schedule IV controlled substance.    Follow up: The CM team will reach out to the patient again over the next 14 days. (PharmD)   Karalee Height, PharmD Clinical Pharmacist [facility] [phone]

## 2018-04-13 NOTE — Chronic Care Management (AMB) (Signed)
Care Management   Follow Up Note   04/13/2018 Name: LABARON DIGIROLAMO MRN: [id number] DOB: [birth year]  Referred by: Birdie Sons, MD Reason for referral : Chronic Care Management (follow up CHF/medication)    Subjective: "Those compression socks have really helped that left ankle so much that I was able to walk 40 minutes today without any discomfort"   Objective:  BP Readings from Last 3 Encounters:  03/24/18 (!) 156/91  11/21/17 112/64  11/17/17 104/67     Assessment: Mr. Makana Rostad. Grieshop is a 58 year old male patient of Dr. Kirstie Peri. Caryn Section how was referred to the Chronic Care Management Team for medication assistance for Entresto prescribed by Cardiologist. Mr. Klipfel is currently unemployed and in the appeals process for permanent disability. He has a history of but not limited to HTN, A-fib, Mitral and Tricuspid regurgitation, HDL, S/P MV repair, and HF. He met with the CCM team to establish health goals on 03/30/2018. Today CCM RN CM followed up with patient via telephone to assess L ankle edema/discoloration and to discuss recent appointment with Dr. Humphrey Rolls, Cardiologist  Review of patient status, including review of consultants reports, relevant laboratory and other test results, and collaboration with appropriate care team members and the patient's provider was performed as part of comprehensive patient evaluation and provision of chronic care management services.    Goals Addressed            This Visit's Progress   . "Aren't all my medications doing the same things for my heart" (pt-stated)   On track    Current Barriers:  Marland Kitchen Knowledge deficit surrounding purpose and mechanism of medications  Pharmacist Clinical Goal(s): Over the next 30 days, Mr. Speranza will verbalize understanding of medication plan as it relates to his plan of care.   Interventions: . Patient educated on purpose, proper use and potential adverse effects of warfarin, Eliquis, Entresto, amiodarone,  carvedilol.   . Patient provided with an easy to understand medication guide made by CCM pharmacist  Update 04/13/18: Patient discontinued warfarin 04/09/18 and initiated Eliquis 04/11/18. Patient educated on purpose, proper use and potential adverse effects of Eliquis.     Patient Self Care Activities:  . Take all medications as prescribed . Contact provider for samples if prescription of Entresto will run out before enrollment in assistance program  Please see past updates related to this goal by clicking on the "Past Updates" button in the selected goal      . COMPLETED: "my left ankle is purple and cool to touch" (pt-stated)   On track    Mr. Jarrells states he has had significant relief in left ankle discoloration, swelling, and pain. He states utilizing the compression stocking has allowed him to walk without pain, discomfort, or color change and for greater distances. He does notice that his left ankle becomes more "purplish" once he removes his stocking. He recently went to his cardiologist who per patient "did not address his issue". Since the compression hose have resolved his issue, he does not feel he needs an acute appointment with his PCP at this time.  Current Barriers:  Marland Kitchen Knowledge Deficits related to potential causes of discoloration in lower legs* .   Nurse Case Manager Clinical Goal(s):  Marland Kitchen Over the next 24 hours patient will report improvement in ankle discoloration and temperature  . Over the next 24 hours patient will report purchasing compression stocking . Over the next 24 hours patient will make appointment to be seen  by PCP if symptoms are not improved  Interventions:  . Provided education on potential causes of ankle swelling/discoloration . Assessed for s/s of HF . Provided patient with verbal instructions on proper application of compression stocking . Discussed importance of lower extremity elevation   Patient Self Care Activities:  . Report condition to RN CM  tomorrow . Purchase compression stocking and apply correctly . Elevate lower extremities when at rest . Request PCP appointment within 24 hours if symptoms worsen  Plan:  . CCM RN CM will follow up with patient tomorrow  *initial goal documentation     . Having heartfailure is scary and I don't understand if it can be managed" (pt-stated)   On track    He continues to weigh and check his BP daily. Reports BPs in the 110s-120s. His weight remains stable without any fluctuations >3 lbs overnight or >5 lbs in one week.  Current Barriers:  Marland Kitchen Knowledge deficit related to management of Heart Failiure  Nurse Case Manager Clinical Goal(s): Over the next 14 days, patient will engage in self care activities related to heart failure management (daily weight, low sodium diet, understanding HF action plan  Interventions:  . Basic HF education including basic pathophysiology of heart failure, causes, effects on daily life, diet, exercise, and management of symptoms  Patient Self Care Activities:  . Weigh self daily and record . Maintain low sodium diet . Follow HF action plan (green, yellow, red zones) . Take medications as prescribed . Monitor and record BP  *initial goal documentation     . I can't afford my medications (pt-stated)       Current Barriers:  . financial  Pharmacist Clinical Goal(s): Over the next 14 days, Mr.. Weekly will provide the necessary supplementary documents (proof of out of pocket prescription expenditure, proof of household income) needed for medication assistance applications to CCM pharmacist.   Interventions: . CCM pharmacist will apply for medication assistance program for Entresto made by [employer]. . CCM pharmacist will contact Dr. Humphrey Rolls about Eliquis over warfarin for atrial fibrillation management . 04/06/18:  Feliz Beam, NP at [facility] (with Dr. Humphrey Rolls) agrees with Eliquis recommendation. Patient needs appointment with [place] pharmacist faxed Delene Loll and Eliquis medication assistance applications to [place], attention Feliz Beam o Follow up as necessary with [facility] and patient  Patient Self Care Activities:  Marland Kitchen Gather necessary documents needed to apply for medication assistance  Please see past updates related to this goal by clicking on the "Past Updates" button in the selected goal           Follow Up Plan: CCM RN CM will follow up with patient in 2 weeks   Brae Schaafsma E. Rollene Rotunda, RN, BSN Nurse Care Coordinator [facility] Care Management [phone]

## 2018-04-13 NOTE — Patient Instructions (Addendum)
Thank you allowing the Chronic Care Management Team to be a part of your care! It was a pleasure speaking with you today!  1. Continue to wear your compression stocking daily. You may want to consider wearing them on both legs since you have has significant relief from pain, swelling, and discoloration. 2. Continue to check your BP daily and weight daily and record. Self assess what HF zone you are in daily. 3. Continue to exercise daily. We are so proud of all the effort you are putting forth to remain active and better your health. 4. Continue to monitor your HF symptoms and report increase in shortness of breath, swelling of hands, feet, ankles, and abdomen. Please try not to worry about your "leaky valve" as it may never cause you additional symptoms 5. Continue to take all medications as prescribed and attend all scheduled medical appointments. 6. Raynelle Fanning and I will check in with you in a couple of weeks but you can call us with any additional questions or concerns.  CCM (Chronic Care Management) Team   Yvone Neu RN, BSN Nurse Care Coordinator  [phone]  Karalee Height PharmD  Clinical Pharmacist  [phone]   Goals Addressed            This Visit's Progress   . "Aren't all my medications doing the same things for my heart" (pt-stated)   On track    Current Barriers:  Marland Kitchen Knowledge deficit surrounding purpose and mechanism of medications  Pharmacist Clinical Goal(s): Over the next 30 days, Mr. Witteman will verbalize understanding of medication plan as it relates to his plan of care.   Interventions: . Patient educated on purpose, proper use and potential adverse effects of warfarin, Eliquis, Entresto, amiodarone, carvedilol.   . Patient provided with an easy to understand medication guide made by CCM pharmacist  Update 04/13/18: Patient discontinued warfarin 04/09/18 and initiated Eliquis 04/11/18. Patient educated on purpose, proper use and potential adverse effects of  Eliquis.     Patient Self Care Activities:  . Take all medications as prescribed . Contact provider for samples if prescription of Entresto will run out before enrollment in assistance program  Please see past updates related to this goal by clicking on the "Past Updates" button in the selected goal      . COMPLETED: "my left ankle is purple and cool to touch" (pt-stated)   On track    Current Barriers:  Marland Kitchen Knowledge Deficits related to potential causes of discoloration in lower legs* .   Nurse Case Manager Clinical Goal(s):  Marland Kitchen Over the next 24 hours patient will report improvement in ankle discoloration and temperature  . Over the next 24 hours patient will report purchasing compression stocking . Over the next 24 hours patient will make appointment to be seen by PCP if symptoms are not improved  Interventions:  . Provided education on potential causes of ankle swelling/discoloration . Assessed for s/s of HF . Provided patient with verbal instructions on proper application of compression stocking . Discussed importance of lower extremity elevation   Patient Self Care Activities:  . Report condition to RN CM tomorrow . Purchase compression stocking and apply correctly . Elevate lower extremities when at rest . Request PCP appointment within 24 hours if symptoms worsen  Plan:  . CCM RN CM will follow up with patient tomorrow  *initial goal documentation     . Having heartfailure is scary and I don't understand if it can be managed" (pt-stated)  On track    Current Barriers:  Marland Kitchen Knowledge deficit related to management of Heart Failiure  Nurse Case Manager Clinical Goal(s): Over the next 14 days, patient will engage in self care activities related to heart failure management (daily weight, low sodium diet, understanding HF action plan  Interventions:  . Basic HF education including basic pathophysiology of heart failure, causes, effects on daily life, diet, exercise, and  management of symptoms  Patient Self Care Activities:  . Weigh self daily and record . Maintain low sodium diet . Follow HF action plan (green, yellow, red zones) . Take medications as prescribed . Monitor and record BP  *initial goal documentation     . I can't afford my medications (pt-stated)       Current Barriers:  . financial  Pharmacist Clinical Goal(s): Over the next 14 days, Mr.. Mccrudden will provide the necessary supplementary documents (proof of out of pocket prescription expenditure, proof of household income) needed for medication assistance applications to CCM pharmacist.   Interventions: . CCM pharmacist will apply for medication assistance program for Entresto made by [employer]. . CCM pharmacist will contact Dr. Welton Flakes about Eliquis over warfarin for atrial fibrillation management . 04/06/18:  Valorie Roosevelt, NP at [facility] (with Dr. Welton Flakes) agrees with Eliquis recommendation. Patient needs appointment with [facility] o CCM pharmacist faxed Sherryll Burger and Eliquis medication assistance applications to [facility], attention Valorie Roosevelt o Follow up as necessary with [facility] and patient  Patient Self Care Activities:  Marland Kitchen Gather necessary documents needed to apply for medication assistance  Please see past updates related to this goal by clicking on the "Past Updates" button in the selected goal          The patient verbalized understanding of instructions provided today and declined a print copy of patient instruction materials.   Telephone follow up appointment with CCM team member scheduled for: 2 weeks

## 2018-04-22 ENCOUNTER — Telehealth: Payer: Self-pay | Admitting: Pharmacist

## 2018-04-22 ENCOUNTER — Ambulatory Visit: Payer: Self-pay | Admitting: Pharmacist

## 2018-04-22 DIAGNOSIS — I10 Essential (primary) hypertension: Secondary | ICD-10-CM

## 2018-04-22 DIAGNOSIS — I5022 Chronic systolic (congestive) heart failure: Secondary | ICD-10-CM

## 2018-04-22 MED ORDER — ALPRAZOLAM 1 MG PO TABS: 0.5000 mg | ORAL_TABLET | Freq: Three times a day (TID) | ORAL | 3 refills | Status: DC | PRN

## 2018-04-22 NOTE — Addendum Note (Signed)
Addended by: Malva Limes on: 04/22/2018 01:19 PM   Modules accepted: Orders

## 2018-04-22 NOTE — Telephone Encounter (Signed)
Dr. Sherrie Mustache-   Mr. Hoak just contacted me about his alprazolam prescription. Below is documentation from my note  04/13/18.   Patient unable to refill alprazolam 1mg  tablet due to new SIG when medication was refilled on 02/16/18 (see telephone note)- Mr. Steinback is enquiring about this? Prescription was written for 90 days so pharmacy will not fill as it is too soon. (Schedule IV controlled substance.)  He had been continuing to take medication as he always had - Alprazolam 1/2 to 1 tablet TID PRN back pain (#90 for 30 days supply) but new prescription is Aprazolam 1mg  once daily QHS (#90 for 90 days supply)   Please let patient know about the dosing change. He does need a prescription sent to [facility] (preferred).   Thanks!   Karalee Height, PharmD Clinical Pharmacist [facility] [phone]

## 2018-04-22 NOTE — Chronic Care Management (AMB) (Signed)
  Care Management   Note  04/22/2018 Name: Samuel Woods MRN: [id number] DOB: 1960/12/20  Incoming call from Samuel Woods, 58 year old patient of Dr. Mila Merry. HIPAA identifiers verified.  Engaged with care management team for heart failure education, medication management, and medication assistance.   Samuel Woods is requesting follow up on alprazolam prescription (see CCM pharmacy note 2/24) and Samuel Woods prescription (prescribed by Dr. Welton Flakes at [facility] to be sent in to [facility]  Plan:Communicate patient's alprazolam needs to Dr. Sherrie Mustache. Coordinate with Dr. Welton Flakes regarding Sherryll Burger prescription  Follow Up : Telephone follow up appointment with CCM team member scheduled for: 04/26/28 CCM pharmacist as scheduled  Karalee Height, PharmD Clinical Pharmacist [facility] [phone]

## 2018-04-27 ENCOUNTER — Ambulatory Visit: Payer: Self-pay | Admitting: Pharmacist

## 2018-04-27 DIAGNOSIS — I5022 Chronic systolic (congestive) heart failure: Secondary | ICD-10-CM

## 2018-04-27 DIAGNOSIS — I4891 Unspecified atrial fibrillation: Secondary | ICD-10-CM

## 2018-04-27 DIAGNOSIS — I10 Essential (primary) hypertension: Secondary | ICD-10-CM

## 2018-04-27 NOTE — Patient Instructions (Signed)
Congratulations! You have met clinical pharmacy case management goals! You may call the case management team at any time should you have a question or if you have new case management needs. We are happy to help you! We will let your doctor know that you have met your goals.    Thank you allowing the Chronic Care Management Team to be a part of your care!   Please call a member of the CCM (Chronic Care Management) Team with any questions or case management needs in the future:   Trish Fountain, RN, BSN Nurse Care Coordinator  [phone]  Ruben Reason, PharmD  Clinical Pharmacist  [phone]   Goals Addressed            This Visit's Progress   . COMPLETED: "Aren't all my medications doing the same things for my heart" (pt-stated)       Current Barriers:  Marland Kitchen Knowledge deficit surrounding purpose and mechanism of medications  Pharmacist Clinical Goal(s): Over the next 30 days, Samuel Woods will verbalize understanding of medication plan as it relates to his plan of care.   Interventions: . Patient educated on purpose, proper use and potential adverse effects of warfarin, Eliquis, Entresto, amiodarone, carvedilol.   . Patient provided with an easy to understand medication guide made by CCM pharmacist  Update 04/13/18: Patient discontinued warfarin 04/09/18 and initiated Eliquis 04/11/18. Patient educated on purpose, proper use and potential adverse effects of Eliquis.     Patient Self Care Activities:  . Take all medications as prescribed . Contact provider for samples if prescription of Entresto will run out before enrollment in assistance program  Please see past updates related to this goal by clicking on the "Past Updates" button in the selected goal      . COMPLETED: I can't afford my medications (pt-stated)       Current Barriers:  . financial  Pharmacist Clinical Goal(s): Over the next 14 days, Samuel Woods will provide the necessary supplementary documents (proof of  out of pocket prescription expenditure, proof of household income) needed for medication assistance applications to CCM pharmacist.   Interventions: . CCM pharmacist will apply for medication assistance program for Entresto made by [employer]. . CCM pharmacist will contact Dr. Humphrey Rolls about Eliquis over warfarin for atrial fibrillation management . 04/06/18:  Feliz Beam, NP at [facility] (with Dr. Humphrey Rolls) agrees with Eliquis recommendation. Patient needs appointment with [place] pharmacist faxed Delene Loll and Eliquis medication assistance applications to [place], attention Feliz Beam o Follow up as necessary with [facility] and patient  Patient Self Care Activities:  Marland Kitchen Gather necessary documents needed to apply for medication assistance  Please see past updates related to this goal by clicking on the "Past Updates" button in the selected goal

## 2018-04-27 NOTE — Chronic Care Management (AMB) (Signed)
  Chronic Care Management   Follow Up Note   04/27/2018 Name: Samuel Woods MRN: [id number] DOB: Jun 26, 1960  Referred by: Samuel Limes, MD Reason for referral : Chronic Care Management (Pharmacy follow up)   Samuel Woods is a 58 y.o. year old male who is a primary care patient of Woods, Samuel Isaacs, MD. The CCM team was consulted for assistance with chronic disease management and care coordination needs.    Review of patient status, including review of consultants reports, relevant laboratory and other test results, and collaboration with appropriate care team members and the patient's provider was performed as part of comprehensive patient evaluation and provision of chronic care management services.    Goals Addressed            This Visit's Progress   . COMPLETED: "Aren't all my medications doing the same things for my heart" (pt-stated)       Current Barriers:  Marland Kitchen Knowledge deficit surrounding purpose and mechanism of medications  Pharmacist Clinical Goal(s): Over the next 30 days, Samuel Woods will verbalize understanding of medication plan as it relates to his plan of care.   Interventions: . Patient educated on purpose, proper use and potential adverse effects of warfarin, Eliquis, Entresto, amiodarone, carvedilol.   . Patient provided with an easy to understand medication guide made by CCM pharmacist  Update 04/13/18: Patient discontinued warfarin 04/09/18 and initiated Eliquis 04/11/18. Patient educated on purpose, proper use and potential adverse effects of Eliquis.     Patient Self Care Activities:  . Take all medications as prescribed . Contact provider for samples if prescription of Entresto will run out before enrollment in assistance program  Please see past updates related to this goal by clicking on the "Past Updates" button in the selected goal      . COMPLETED: I can't afford my medications (pt-stated)       Current Barriers:  . financial  Pharmacist Clinical  Goal(s): Over the next 14 days, Samuel Woods will provide the necessary supplementary documents (proof of out of pocket prescription expenditure, proof of household income) needed for medication assistance applications to CCM pharmacist.   Interventions: . CCM pharmacist will apply for medication assistance program for Entresto made by [employer]. . CCM pharmacist will contact Dr. Welton Woods about Eliquis over warfarin for atrial fibrillation management . 04/06/18:  Samuel Roosevelt, NP at [facility] (with Dr. Welton Woods) agrees with Eliquis recommendation. Patient needs appointment with [facility] o CCM pharmacist faxed Samuel Woods and Eliquis medication assistance applications to [facility], attention Samuel Woods o Follow up as necessary with [facility] and patient  Patient Self Care Activities:  Marland Kitchen Gather necessary documents needed to apply for medication assistance  Please see past updates related to this goal by clicking on the "Past Updates" button in the selected goal           The patient has been provided with contact information for the chronic care management team and has been advised to call with any health related questions or concerns.    Karalee Height, PharmD Clinical Pharmacist [facility] [phone]

## 2018-05-06 ENCOUNTER — Ambulatory Visit: Payer: Self-pay

## 2018-05-06 ENCOUNTER — Other Ambulatory Visit: Payer: Self-pay

## 2018-05-06 DIAGNOSIS — I5022 Chronic systolic (congestive) heart failure: Secondary | ICD-10-CM

## 2018-05-06 DIAGNOSIS — I10 Essential (primary) hypertension: Secondary | ICD-10-CM

## 2018-05-06 NOTE — Chronic Care Management (AMB) (Signed)
Care Management   Follow Up Note   05/07/2018 Name: Samuel Woods MRN: [id number] DOB: [birth year]  Referred by: Birdie Sons, MD Reason for referral : Chronic Care Management (follow up on CHF)    Subjective: "I am still doing everything you told me to do to make sure my heart stays healthy"   Objective:  BP Readings from Last 3 Encounters:  03/24/18 (!) 156/91  11/21/17 112/64  11/17/17 104/67     Assessment: Samuel Woods is a 58 year old male patient of Dr. Kirstie Peri. Caryn Section how was referred to the Chronic Care Management Team for medication assistance for Entresto prescribed by Cardiologist. Samuel Woods is currently unemployed and in the appeals process for permanent disability. He has a history of but not limited to HTN, A-fib, Mitral and Tricuspid regurgitation, HDL, S/P MV repair, and HF.He met with the CCM team to establish health goals on 03/30/2018. Today CCM RN CM followed up with Samuel Woods to assess needs, discuss goals, and address any concerns he may have related to the COVID-19 pandemic.  Review of patient status, including review of consultants reports, relevant laboratory and other test results, and collaboration with appropriate care team members and the patient's provider was performed as part of comprehensive patient evaluation and provision of chronic care management services.    Goals Addressed            This Visit's Progress   . COMPLETED: "My initial disability was denied and I have no income at this time" (pt-stated)       Samuel Woods has been in touch with [facility]. He qualifies for their services however he is trying to walk daily to see if he can build back his stamina and strength and possible return to his previous type of work part time. He has filed for disability and was denied. The appeal is in process. He will contact [facility] if appeal is not approved.   Current Barriers:  Marland Kitchen Knowledge deficit related to community  resources available for reentering workforce with heart disease . financial  Nurse Case Manager Clinical Goal(s): Over the next 30 days, patient will explore vocational rehab services  Interventions:  . Spoke with Thayer Woods at [employer] to understand resources  . Provided patient with contact information with [facility] o [phone]  Patient Self Care Activities:  . Engage with [facility]  *initial goal documentation     . Having heartfailure is scary and I don't understand if it can be managed" (pt-stated)   On track    Samuel Woods is doing a great job follow his plan of care related to HF self care. He is weighing twice daily and recording. He understands his HF action plan and when to call the doctor. He is instructed to only weight in the mornings as this is the most accurate weight of the day. He is exercising daily and wearing compression stockings has allowed him to walk further as his legs are not as painful. He is taking all his medications as prescribed. He is currently only taking his diuretic every other day (per prescription change) but aware to notify cardiologist if he see additional lower extremity swelling or his shortness of breath worsens.   Current Barriers:  Marland Kitchen Knowledge deficit related to management of Heart Failiure  Nurse Case Manager Clinical Goal(s): Over the next 30 days, patient will engage in self care activities related to heart failure management (daily weight, low sodium diet, understanding  HF action plan  Interventions:  . Assessed patients knowledge of heart failure self care including compliance with daily weights,  Patient Self Care Activities:  . Weigh self daily and record . Maintain low sodium diet . Follow HF action plan (green, yellow, red zones) . Take medications as prescribed . Monitor and record BP  Please see past updates related to this goal by clicking on the "Past Updates" button in the selected goal           Follow Up Plan: Telephone outreach for 30 days   Genita Nilsson E. Rollene Rotunda, RN, BSN Nurse Care Coordinator [facility] Care Management [phone]

## 2018-05-06 NOTE — Patient Instructions (Addendum)
  Thank you allowing the Chronic Care Management Team to be a part of your care! It was a pleasure speaking with you today!  1. Please continue to weigh yourself daily, first thing in the morning after you urinate 2. Call your cardiologist if you gain 3 lbs overnight or 5 lbs in a week. 3. Continue to read lables and monitor your sodium intake. Please limit to 2000mg /day 4. Follow your Heart Failure Action Plan and assess yourself daily. 5. Take all medications including your heart medications as prescribed. 6. Please follow CDC guideline for symptom reporting, social distancing, and hand hygrine as we discussed  CCM (Chronic Care Management) Team   Yvone Neu RN, BSN Nurse Care Coordinator  [phone]  Karalee Height PharmD  Clinical Pharmacist  [phone]   [place] [address], LCSW Clinical Social Worker [phone]  Goals Addressed            This Visit's Progress   . Having heartfailure is scary and I don't understand if it can be managed" (pt-stated)       Current Barriers:  Marland Kitchen Knowledge deficit related to management of Heart Failiure  Nurse Case Manager Clinical Goal(s): Over the next 14 days, patient will engage in self care activities related to heart failure management (daily weight, low sodium diet, understanding HF action plan  Interventions:  . Basic HF education including basic pathophysiology of heart failure, causes, effects on daily life, diet, exercise, and management of symptoms  Patient Self Care Activities:  . Weigh self daily and record . Maintain low sodium diet . Follow HF action plan (green, yellow, red zones) . Take medications as prescribed . Monitor and record BP  Please see past updates related to this goal by clicking on the "Past Updates" button in the selected goal         The patient verbalized understanding of instructions provided today and declined a print copy of patient instruction materials.   Telephone follow up  appointment with CCM team member scheduled for: 30 days

## 2018-06-29 ENCOUNTER — Ambulatory Visit: Payer: Self-pay | Admitting: Pharmacist

## 2018-06-29 DIAGNOSIS — I5022 Chronic systolic (congestive) heart failure: Secondary | ICD-10-CM

## 2018-06-29 DIAGNOSIS — E785 Hyperlipidemia, unspecified: Secondary | ICD-10-CM

## 2018-06-29 NOTE — Patient Instructions (Signed)
Goals Addressed            This Visit's Progress   . Dr. Welton Flakes increased some of my doses (pt-stated)       Current Barriers:  Marland Kitchen Knowledge deficit related to purpose and mechanism of medications taken for HLD and CHF . New medication Zetia  Pharmacist Clinical Goal(s): Over the 30 days, Samuel Woods will verbalize understanding of medication plan as it relates to his plan of care.   Interventions: . Patient educated on purpose, proper use and potential adverse effects of Zetia.   . Counseled patient on dose titration of Entresto  Patient Self Care Activities:  . Take all medications as prescribed . Contact provider for samples if prescription of Sherryll Burger will run out   Initial goal documentation        The patient verbalized understanding of instructions provided today and declined a print copy of patient instruction materials.

## 2018-06-29 NOTE — Chronic Care Management (AMB) (Signed)
  Care Management   Follow Up Note   06/29/2018 Name: KHOURY BORDE MRN: [id number] DOB: 1961-01-11  Referred by: Malva Limes, MD Reason for referral : Chronic Care Management (Medication management)   DEEP SIMERSON is a 58 y.o. year old male who is a primary care patient of Fisher, Demetrios Isaacs, MD. The CCM team was consulted for assistance with chronic disease management and care coordination needs.    Review of patient status, including review of consultants reports, relevant laboratory and other test results, and collaboration with appropriate care team members and the patient's provider was performed as part of comprehensive patient evaluation and provision of chronic care management services.    Goals Addressed            This Visit's Progress   . Dr. Welton Flakes increased some of my doses (pt-stated)       Current Barriers:  Marland Kitchen Knowledge deficit related to purpose and mechanism of medications taken for HLD and CHF . New medication Zetia  Pharmacist Clinical Goal(s): Over the 30 days, Mr.. Hartt will verbalize understanding of medication plan as it relates to his plan of care.   Interventions: . Patient educated on purpose, proper use and potential adverse effects of Zetia.   . Counseled patient on dose titration of Entresto  Patient Self Care Activities:  . Take all medications as prescribed . Contact provider for samples if prescription of Entresto will run out   Initial goal documentation        Plan: - follow up with Dr. Milta Deiters office regarding new Entresto dose and assistance program - find Zetia coupons or discount programs (patient paid $36 at [employer])  Follow up: The CM team will reach out to the patient again over the next 5-7 days.   Karalee Height, PharmD Clinical Pharmacist [facility] [phone]

## 2018-07-02 ENCOUNTER — Ambulatory Visit: Payer: Self-pay | Admitting: Pharmacist

## 2018-07-02 DIAGNOSIS — E785 Hyperlipidemia, unspecified: Secondary | ICD-10-CM

## 2018-07-02 DIAGNOSIS — I5022 Chronic systolic (congestive) heart failure: Secondary | ICD-10-CM

## 2018-07-03 NOTE — Chronic Care Management (AMB) (Signed)
  Chronic Care Management   Follow Up Note   07/03/2018 Name: MARTHA GUERRY MRN: [id number] DOB: 04/20/1960  Referred by: Malva Limes, MD Reason for referral : Chronic Care Management (Incoming call from patient)   TAJA GABEL is a 58 y.o. year old male who is a primary care patient of Fisher, Demetrios Isaacs, MD. The CCM team was consulted for assistance with chronic disease management and care coordination needs.    Review of patient status, including review of consultants reports, relevant laboratory and other test results, and collaboration with appropriate care team members and the patient's provider was performed as part of comprehensive patient evaluation and provision of chronic care management services.    Goals Addressed            This Visit's Progress   . Dr. Welton Flakes increased some of my doses (pt-stated)       Current Barriers:  Marland Kitchen Knowledge deficit related to purpose and mechanism of medications taken for HLD and CHF . New medication Zetia  Pharmacist Clinical Goal(s): Over the 30 days, Mr.. Shahan will verbalize understanding of medication plan as it relates to his plan of care.   Interventions: . Patient educated on purpose, proper use and potential adverse effects of Zetia.   . Counseled patient on dose titration of Entresto . CCM pharmacist provided patient with an assistance application for Zetia to complete with Dr. Welton Flakes . CCM pharmacist contacted Dr. Milta Deiters office and left message with CMA on how to send in new rx to [employer] for dose change on Entresto.   Patient Self Care Activities:  . Take all medications as prescribed . Contact provider for samples if prescription of Entresto will run out   Please see past updates related to this goal by clicking on the "Past Updates" button in the selected goal          The patient has been provided with contact information for the chronic care management team and has been advised to call with any health related  questions or concerns.  No further follow up required: patient may contact pharmacist with any questions regarding his medications or therapeutic management in the future.    Karalee Height, PharmD Clinical Pharmacist [facility] [phone]

## 2018-08-03 ENCOUNTER — Ambulatory Visit: Payer: Self-pay | Admitting: Pharmacist

## 2018-08-03 DIAGNOSIS — I5022 Chronic systolic (congestive) heart failure: Secondary | ICD-10-CM

## 2018-08-03 DIAGNOSIS — I4891 Unspecified atrial fibrillation: Secondary | ICD-10-CM

## 2018-08-05 NOTE — Chronic Care Management (AMB) (Signed)
  Care Management   Follow Up Note   08/05/2018 - LATE ENTRY Name: Samuel Woods MRN: [id number] DOB: 03/03/1960  Referred by: Birdie Sons, MD Woods for referral : Chronic Care Management (Med assist)   Samuel Woods is a 58 y.o. year old male who is a primary care patient of Fisher, Kirstie Peri, MD. The care management team was consulted for assistance with chronic disease management and care coordination needs.    Subjective: Incoming call from [name]. HIPAA identifiers verified. Samuel Woods had questions regarding two medication assistance applications provided to him previously by [facility] pharmacist now that refills are due. CCM pharmacist explained refill process, noting that Dr. Caryn Section is not the prescriber for these medications unfortunately so the process will not be as smooth.   Contacted Dr. Laurelyn Sickle office [facility]) to review refill process for medication assistance program with Dr. Laurelyn Sickle nurse. Provided phone numbers for assistance programs for [employer] and [employer].     Samuel Woods, PharmD Clinical Pharmacist [place] [phone]

## 2018-08-12 ENCOUNTER — Ambulatory Visit: Payer: Self-pay | Admitting: Pharmacist

## 2018-08-12 DIAGNOSIS — I5022 Chronic systolic (congestive) heart failure: Secondary | ICD-10-CM

## 2018-08-12 NOTE — Chronic Care Management (AMB) (Signed)
  Care Management   Note  08/12/2018 Name: BROEDY OSBOURNE MRN: [id number] DOB: [birth year]  Subjective: Incoming call from Mr. Julien Girt. HIPAA identifiers verified. Mr. Hatchel still is having difficulty obtaining Entresto refill from prescriber Dr. Humphrey Rolls. See CCM pharmacist note 08/03/18 for detail.   Plan: Contacted Dr. Laurelyn Sickle office [facility]) to review refill process for medication assistance program with Dr. Laurelyn Sickle nurse. Unfortunately had to leave a voicemail as there was apparently NO provider in the office at all.  Provided phone numbers for assistance program for [employer] as well as fax number to send prescription to.   Dr. Laurelyn Sickle office did not have any Entresto refiills.   Also left a message with front desk receptionist to please leave a message for Dr. Humphrey Rolls and Feliz Beam to please fax the NEW doseage of Delene Loll in to the [employer] in the morning and provided fax number.   Follow up with provider re: faxing appropriate refill request to [employer] at 8:30 tomorrow morning  Ruben Reason, PharmD Clinical Pharmacist [place] [phone]

## 2018-08-13 ENCOUNTER — Ambulatory Visit: Payer: Self-pay | Admitting: Pharmacist

## 2018-08-13 NOTE — Chronic Care Management (AMB) (Signed)
  Chronic Care Management   Note  08/13/2018 Name: TAFARI HUMISTON MRN: [id number] DOB: 1961/01/25  Again contacted [facility] trying to reach a prescriber or RN in person instead of leaving a message, regarding Mr. Althea Charon prescription- dose change and refill that needs to be sent to the [employer] that he is enrolled in. Once again, no provider (MD, NP, or RN) is available. I left another message with the nurse voicemail and with receptionist on how to send in Mr. Statz refill to [employer] with the fax and phone numbers.   Follow up plan: No further follow up required: [place] send prescription for Entresto to [employer]. It might be in patient's best interest to seek care at a different cardiology practice.   Ruben Reason, PharmD Clinical Pharmacist [place] [phone]

## 2018-09-07 ENCOUNTER — Other Ambulatory Visit: Payer: Self-pay | Admitting: Family Medicine

## 2018-09-25 ENCOUNTER — Telehealth: Payer: Self-pay | Admitting: Family Medicine

## 2018-09-25 NOTE — Telephone Encounter (Signed)
Pt wanting an appt with Dr. Caryn Section.  To discuss his heart failure medications. Pt believes he's over medicated.   He is currently having:  Short of breath Achy in legs and back   Please call pt back at [phone]  Thanks, [facility]

## 2018-10-05 NOTE — Telephone Encounter (Signed)
Do I need to schedule an office visit for this pt?  Thanks,   -Mickel Baas

## 2018-10-05 NOTE — Telephone Encounter (Signed)
Can make an appointment so long as he is not having fever, chills, cough, or upper respiratory infection symptoms.  Otherwise will need virtual visit.

## 2018-10-05 NOTE — Telephone Encounter (Signed)
Pt called back today saying he has not yet been called back and is having symptoms of sore legs, SOB, back pain.  CB#  [phone]  Con [place]

## 2018-10-06 NOTE — Telephone Encounter (Signed)
Apt made for 10/09/2018 at 4pm   Thanks,   -Mickel Baas

## 2018-10-09 ENCOUNTER — Ambulatory Visit: Payer: Self-pay | Admitting: Family Medicine

## 2018-10-12 ENCOUNTER — Other Ambulatory Visit: Payer: Self-pay

## 2018-10-12 ENCOUNTER — Ambulatory Visit (INDEPENDENT_AMBULATORY_CARE_PROVIDER_SITE_OTHER): Payer: Self-pay | Admitting: Family Medicine

## 2018-10-12 ENCOUNTER — Ambulatory Visit
Admission: RE | Admit: 2018-10-12 | Discharge: 2018-10-12 | Disposition: A | Discharge status: Home / Self Care | Payer: Self-pay | Attending: Family Medicine | Admitting: Family Medicine

## 2018-10-12 ENCOUNTER — Ambulatory Visit: Admission: RE | Admit: 2018-10-12 | Payer: Self-pay | Source: Home / Self Care

## 2018-10-12 ENCOUNTER — Encounter: Payer: Self-pay | Admitting: Family Medicine

## 2018-10-12 ENCOUNTER — Ambulatory Visit
Admission: RE | Admit: 2018-10-12 | Discharge: 2018-10-12 | Disposition: A | Discharge status: Home / Self Care | Payer: Self-pay | Source: Ambulatory Visit | Attending: Family Medicine | Admitting: Family Medicine

## 2018-10-12 VITALS — BP 148/94 | HR 67 | Temp 97.3°F | Wt 191.0 lb

## 2018-10-12 DIAGNOSIS — I519 Heart disease, unspecified: Secondary | ICD-10-CM

## 2018-10-12 DIAGNOSIS — Z23 Encounter for immunization: Secondary | ICD-10-CM

## 2018-10-12 DIAGNOSIS — R0600 Dyspnea, unspecified: Secondary | ICD-10-CM

## 2018-10-12 DIAGNOSIS — R5383 Other fatigue: Secondary | ICD-10-CM

## 2018-10-12 DIAGNOSIS — I4891 Unspecified atrial fibrillation: Secondary | ICD-10-CM

## 2018-10-12 DIAGNOSIS — E785 Hyperlipidemia, unspecified: Secondary | ICD-10-CM

## 2018-10-12 MED ORDER — ATORVASTATIN CALCIUM 40 MG PO TABS: 40.0000 mg | ORAL_TABLET | Freq: Every evening | ORAL | 4 refills | Status: DC

## 2018-10-12 NOTE — Progress Notes (Signed)
Patient: Samuel Woods Male    DOB: 03/04/1960   58 y.o.   MRN: [id number] Visit Date: 10/12/2018  Today's Provider: Mila Merryonald Pallie Swigert, MD   Chief Complaint  Patient presents with  . Congestive Heart Failure  . Generalized Body Aches  . Shortness of Breath   Subjective:      HPI   Pt is coming in today to discuss his medications.  He thinks he is being "over medicated".  He states he is extremely fatigued with severe shortness of breath with minimal exertion.  He is also complaining of back and leg pain.  No   Quit smoking in 2004 after heart surgery, had smoked about 10 years.     Allergies  Allergen Reactions  . Dilaudid [Hydromorphone Hcl] Other (See Comments)    According to patient's wife he had hallucinations after having dilaudid     Current Outpatient Medications:  .  ALPRAZolam (XANAX) 1 MG tablet, TAKE 1/2 TO 1 (ONE-HALF TO ONE) TABLET BY MOUTH THREE TIMES DAILY AS NEEDED, Disp: 90 tablet, Rfl: 2 .  amiodarone (PACERONE) 200 MG tablet, Take 200 mg by mouth daily. , Disp: , Rfl: 0 .  apixaban (ELIQUIS) 5 MG TABS tablet, Take 5 mg by mouth 2 (two) times daily., Disp: , Rfl:  .  atorvastatin (LIPITOR) 20 MG tablet, Take 20 mg by mouth 2 (two) times daily., Disp: , Rfl:  .  carvedilol (COREG) 12.5 MG tablet, Take 12.5 mg by mouth 2 (two) times daily., Disp: , Rfl: 1 .  cyclobenzaprine (FLEXERIL) 5 MG tablet, Take 1 tablet (5 mg total) by mouth every 6 (six) hours as needed for muscle spasms., Disp: 60 tablet, Rfl: 3 .  ENTRESTO 49-51 MG, Take 1 tablet by mouth 2 (two) times daily. , Disp: , Rfl: 3 .  ezetimibe (ZETIA) 10 MG tablet, Take 10 mg by mouth daily., Disp: , Rfl:  .  ibuprofen (ADVIL,MOTRIN) 200 MG tablet, Take 400 mg by mouth every 8 (eight) hours as needed (for pain.). , Disp: , Rfl:  .  Multiple Vitamin (MULTIVITAMIN WITH MINERALS) TABS tablet, Take 1 tablet by mouth daily. Centrum Silver Men 50+, Disp: , Rfl:  .  torsemide (DEMADEX) 10 MG tablet, Take 10  mg by mouth every Monday, Wednesday, and Friday. , Disp: , Rfl:   Review of Systems  Constitutional: Positive for fatigue. Negative for activity change, appetite change, chills, diaphoresis, fever and unexpected weight change.  Respiratory: Positive for chest tightness and shortness of breath. Negative for apnea, cough, choking, wheezing and stridor.   Cardiovascular: Negative for chest pain, palpitations and leg swelling.  Gastrointestinal: Negative.   Neurological: Positive for numbness (Left arm numbness). Negative for dizziness, light-headedness and headaches.  Hematological: Bruises/bleeds easily.    Social History   Tobacco Use  . Smoking status: Former Smoker    Packs/day: 0.50    Years: 15.00    Pack years: 7.50    Quit date: 07/19/2017    Years since quitting: 1.2  . Smokeless tobacco: Never Used  . Tobacco comment: stopped smoking 10/2017.   Substance Use Topics  . Alcohol use: Yes    Alcohol/week: 0.0 standard drinks    Comment: occasional use      Objective:   BP (!) 148/94 (BP Location: Right Arm, Patient Position: Sitting, Cuff Size: Normal)   Pulse 67   Temp (!) 97.3 F (36.3 C) (Oral)   Wt 191 lb (86.6 kg)   BMI  25.90 kg/m     Physical Exam   General Appearance:    Alert, cooperative, no distress  Eyes:    PERRL, conjunctiva/corneas clear, EOM's intact       Lungs:     Clear to auscultation bilaterally, respirations unlabored  Heart:    Normal heart rate. Regular rhythm.  blowing, holosystolic murmur at apex Trace bipedal edema  MS:   All extremities are intact.   Neurologic:   Awake, alert, oriented x 3. No apparent focal neurological           defect.          Assessment & Plan    1. Dyspnea, unspecified type  - Comprehensive metabolic panel - Brain natriuretic peptide - CBC with Differential/Platelet - TSH - DG Chest 2 View; Future  2. Other fatigue  - Comprehensive metabolic panel - Brain natriuretic peptide - CBC with  Differential/Platelet - TSH  3. Systolic dysfunction   4. Hyperlipidemia, unspecified hyperlipidemia type  - atorvastatin (LIPITOR) 40 MG tablet; Take 1 tablet (40 mg total) by mouth every evening.  Dispense: 90 tablet; Refill: 4  5. Atrial fibrillation, unspecified type (HCC) Currently normal rhythm on amiodarone.   6. Need for influenza vaccination  - Flu Vaccine QUAD 6+ mos PF IM (Fluarix Quad PF)     Lelon Huh, MD  [facility]

## 2018-10-12 NOTE — Patient Instructions (Addendum)
.   Please review the attached list of medications and notify my office if there are any errors.   . Please bring all of your medications to every appointment so we can make sure that our medication list is the same as yours.   Please go to the lab draw station in [address] on the second floor of [facility] . Normal hours are 8:00am to 12:30pm and 1:30pm to 4:00pm Monday through Friday  Go to the [facility] on [address] for chest Xray   

## 2018-10-14 LAB — COMPREHENSIVE METABOLIC PANEL
ALT: 27 IU/L (ref 0–44)
AST: 20 IU/L (ref 0–40)
Albumin/Globulin Ratio: 2 (ref 1.2–2.2)
Albumin: 4.7 g/dL (ref 3.8–4.9)
Alkaline Phosphatase: 78 IU/L (ref 39–117)
BUN/Creatinine Ratio: 19 (ref 9–20)
BUN: 22 mg/dL (ref 6–24)
Bilirubin Total: 0.7 mg/dL (ref 0.0–1.2)
CO2: 24 mmol/L (ref 20–29)
Calcium: 10 mg/dL (ref 8.7–10.2)
Chloride: 101 mmol/L (ref 96–106)
Creatinine, Ser: 1.18 mg/dL (ref 0.76–1.27)
GFR calc Af Amer: 78 mL/min/{1.73_m2} (ref >59)
GFR calc non Af Amer: 67 mL/min/{1.73_m2} (ref >59)
Globulin, Total: 2.3 g/dL (ref 1.5–4.5)
Glucose: 122 mg/dL — ABNORMAL HIGH (ref 65–99)
Potassium: 5 mmol/L (ref 3.5–5.2)
Sodium: 140 mmol/L (ref 134–144)
Total Protein: 7 g/dL (ref 6.0–8.5)

## 2018-10-14 LAB — CBC WITH DIFFERENTIAL/PLATELET
Basophils Absolute: 0.1 10*3/uL (ref 0.0–0.2)
Basos: 1 %
EOS (ABSOLUTE): 0.1 10*3/uL (ref 0.0–0.4)
Eos: 2 %
Hematocrit: 47 % (ref 37.5–51.0)
Hemoglobin: 15.9 g/dL (ref 13.0–17.7)
Immature Grans (Abs): 0 10*3/uL (ref 0.0–0.1)
Immature Granulocytes: 1 %
Lymphocytes Absolute: 2.7 10*3/uL (ref 0.7–3.1)
Lymphs: 41 %
MCH: 30.8 pg (ref 26.6–33.0)
MCHC: 33.8 g/dL (ref 31.5–35.7)
MCV: 91 fL (ref 79–97)
Monocytes Absolute: 0.7 10*3/uL (ref 0.1–0.9)
Monocytes: 11 %
Neutrophils Absolute: 3 10*3/uL (ref 1.4–7.0)
Neutrophils: 44 %
Platelets: 272 10*3/uL (ref 150–450)
RBC: 5.16 x10E6/uL (ref 4.14–5.80)
RDW: 12.8 % (ref 11.6–15.4)
WBC: 6.6 10*3/uL (ref 3.4–10.8)

## 2018-10-14 LAB — TSH: TSH: 1.5 u[IU]/mL (ref 0.450–4.500)

## 2018-10-14 LAB — BRAIN NATRIURETIC PEPTIDE: BNP: 46.7 pg/mL (ref 0.0–100.0)

## 2018-10-15 NOTE — Progress Notes (Signed)
Test added.   

## 2018-10-16 ENCOUNTER — Telehealth: Payer: Self-pay

## 2018-10-16 NOTE — Telephone Encounter (Signed)
-----   Message from Birdie Sons, MD sent at 10/16/2018  8:16 AM EDT ----- Labs are all normal. Cardiac enzymes are very good. No explanation for fatigue. There is a strong link between a-fib and sleep apnea. Has patient ever had sleep study? If not we should order one.

## 2018-10-16 NOTE — Telephone Encounter (Signed)
Pt advised.  He has not had a sleep study but states he can't afford one right now.  He does not have insurance.  Pt would like a copy of his labs sent to Dr. Humphrey Rolls his cardiologist at [facility].  Is it okay to fax the results?   Thanks,    -Mickel Baas

## 2018-10-16 NOTE — Telephone Encounter (Signed)
Ok, I went ahead and sent them.

## 2018-10-20 LAB — CK: Total CK: 79 U/L (ref 41–331)

## 2018-10-20 LAB — SPECIMEN STATUS REPORT

## 2018-10-29 ENCOUNTER — Ambulatory Visit: Payer: Self-pay | Admitting: Pharmacist

## 2018-10-29 DIAGNOSIS — R0600 Dyspnea, unspecified: Secondary | ICD-10-CM

## 2018-10-29 DIAGNOSIS — I519 Heart disease, unspecified: Secondary | ICD-10-CM

## 2018-10-30 ENCOUNTER — Encounter: Payer: Self-pay | Admitting: Family Medicine

## 2018-10-30 DIAGNOSIS — I5189 Other ill-defined heart diseases: Secondary | ICD-10-CM | POA: Insufficient documentation

## 2018-10-30 DIAGNOSIS — I517 Cardiomegaly: Secondary | ICD-10-CM | POA: Insufficient documentation

## 2018-10-30 NOTE — Chronic Care Management (AMB) (Signed)
  Care Management   Note  10/30/2018 Name: Samuel Woods MRN: [id number] DOB: February 07, 1961  Incoming call from Melanee Spry, patient of Dr. Lelon Huh. CCM clinical pharmacist previously assisted him with medication assistance.   [name] has had medication dose increases. Dropped off new rx for Entresto due to dose increase. I have scanned the new rx to [employer] (copy uploaded under media tab). I updated medication list to reflect changes. .  Allergies as of 10/29/2018      Reactions   Dilaudid [hydromorphone Hcl] Other (See Comments)   According to patient's wife he had hallucinations after having dilaudid      Medication List       Accurate as of October 29, 2018 11:59 PM. If you have any questions, ask your nurse or doctor.        ALPRAZolam 1 MG tablet Commonly known as: XANAX TAKE 1/2 TO 1 (ONE-HALF TO ONE) TABLET BY MOUTH THREE TIMES DAILY AS NEEDED   amiodarone 200 MG tablet Commonly known as: PACERONE Take 200 mg by mouth daily.   apixaban 5 MG Tabs tablet Commonly known as: ELIQUIS Take 5 mg by mouth 2 (two) times daily.   atorvastatin 40 MG tablet Commonly known as: LIPITOR Take 1 tablet (40 mg total) by mouth every evening.   carvedilol 12.5 MG tablet Commonly known as: COREG Take 25 mg by mouth 2 (two) times daily.   cyclobenzaprine 5 MG tablet Commonly known as: FLEXERIL Take 1 tablet (5 mg total) by mouth every 6 (six) hours as needed for muscle spasms.   Entresto 97-103 MG Generic drug: sacubitril-valsartan Take 1 tablet by mouth 2 (two) times daily.   ezetimibe 10 MG tablet Commonly known as: ZETIA Take 10 mg by mouth daily.   ibuprofen 200 MG tablet Commonly known as: ADVIL Take 400 mg by mouth every 8 (eight) hours as needed (for pain.).   multivitamin with minerals Tabs tablet Take 1 tablet by mouth daily. Centrum Silver Men 50+   spironolactone 25 MG tablet Commonly known as: ALDACTONE Take 12.5 mg by mouth daily.   torsemide 10  MG tablet Commonly known as: DEMADEX Take 40 mg by mouth daily.       Follow up  The patient has been provided with contact information for the care management team and has been advised to call with any health related questions or concerns.   Ruben Reason, PharmD Clinical Pharmacist [place] [phone]

## 2018-12-08 ENCOUNTER — Other Ambulatory Visit: Payer: Self-pay | Admitting: Family Medicine

## 2018-12-08 DIAGNOSIS — M545 Low back pain, unspecified: Secondary | ICD-10-CM

## 2019-01-08 ENCOUNTER — Telehealth: Payer: Self-pay

## 2019-01-11 ENCOUNTER — Telehealth: Payer: Self-pay

## 2019-01-18 ENCOUNTER — Telehealth: Payer: Self-pay

## 2019-01-18 ENCOUNTER — Ambulatory Visit: Payer: Self-pay | Admitting: Pharmacist

## 2019-01-19 NOTE — Chronic Care Management (AMB) (Signed)
  Care Management   Note  01/19/2019 Name: Samuel Woods MRN: [id number] DOB: 1961/01/11  58 y.o. year old male referred to Care Management by Dr. Caryn Section for medication assistance. Returning patient's call/voicemail to clinical pharmacist.  Was unable to reach patient via telephone today and have left HIPAA compliant voicemail asking patient to return my call. (unsuccessful outreach #1).   Follow up plan:  A HIPPA compliant phone message was left for the patient providing contact information and requesting a return call.  The care management team will reach out to the patient again over the next 5-10 days.   Ruben Reason, PharmD Clinical Pharmacist [place] [phone]

## 2019-03-25 ENCOUNTER — Ambulatory Visit: Payer: Self-pay | Admitting: Pharmacist

## 2019-04-01 NOTE — Chronic Care Management (AMB) (Signed)
  Care Management   Note  04/01/2019 Name: Samuel Woods MRN: [id number] DOB: 11-Feb-1961  59 y.o. year old male referred to Care Management by Dr. Mila Merry for medication assistance. Outreach call today in response to a voicemail left by the patient for clinical pharmacist.   Was unable to reach patient via telephone today and have left HIPAA compliant voicemail asking patient to return my call. (unsuccessful outreach #1).   Follow up plan:  A HIPPA compliant phone message was left for the patient providing contact information and requesting a return call.  The care management team will reach out to the patient again over the next 5-10 days.   Karalee Height, PharmD Clinical Pharmacist [facility] [phone]

## 2019-04-05 ENCOUNTER — Ambulatory Visit: Payer: Self-pay | Admitting: Pharmacist

## 2019-04-05 ENCOUNTER — Other Ambulatory Visit: Payer: Self-pay | Admitting: Family Medicine

## 2019-04-05 NOTE — Telephone Encounter (Signed)
Requested medication (s) are due for refill today: yes  Requested medication (s) are on the active medication list: yes   Last refill: 12/08/2018    #90   3 refills  Future visit scheduled no  Notes to clinic: Not delegated  Requested Prescriptions  Pending Prescriptions Disp Refills   ALPRAZolam (XANAX) 1 MG tablet [Pharmacy Med Name: ALPRAZolam 1 MG Oral Tablet] 90 tablet 0    Sig: TAKE 1/2 TO 1 (ONE-HALF TO ONE) TABLET BY MOUTH THREE TIMES DAILY AS NEEDED      Not Delegated - Psychiatry:  Anxiolytics/Hypnotics Failed - 04/05/2019  6:32 PM      Failed - This refill cannot be delegated      Failed - Urine Drug Screen completed in last 360 days.      Passed - Valid encounter within last 6 months    Recent Outpatient Visits           5 months ago Dyspnea, unspecified type   [facility] Malva Limes, MD   1 year ago Essential hypertension   [facility] Malva Limes, MD   1 year ago Shortness of breath   [facility] Malva Limes, MD   1 year ago Atrial fibrillation, unspecified type [facility])   [facility] Malva Limes, MD   2 years ago Annual physical exam   [facility] Malva Limes, MD

## 2019-04-12 NOTE — Chronic Care Management (AMB) (Signed)
  Care Management   Note  04/12/2019 Name: Samuel Woods MRN: [id number] DOB: August 30, 1960  59 y.o. year old male referred to Care Management by Dr. Sherrie Mustache for medication assistance. Returning patient's voicemail to clinical pharmacist.   Was unable to reach patient via telephone today and have left HIPAA compliant voicemail asking patient to return my call. (unsuccessful outreach #2).   Follow up plan:  A HIPPA compliant phone message was left for the patient providing contact information and requesting a return call.   Karalee Height, PharmD Clinical Pharmacist [facility] [phone]

## 2019-05-15 ENCOUNTER — Other Ambulatory Visit: Payer: Self-pay | Admitting: Family Medicine

## 2019-05-15 DIAGNOSIS — M545 Low back pain, unspecified: Secondary | ICD-10-CM

## 2019-07-20 ENCOUNTER — Other Ambulatory Visit: Payer: Self-pay | Admitting: Family Medicine

## 2019-07-20 NOTE — Telephone Encounter (Signed)
Requested medication (s) are due for refill today: yes  Requested medication (s) are on the active medication list: yes  Last refill:  06/20/2019  Future visit scheduled: no  Notes to clinic: this refill cannot be delegated    Requested Prescriptions  Pending Prescriptions Disp Refills   ALPRAZolam (XANAX) 1 MG tablet [Pharmacy Med Name: ALPRAZolam 1 MG Oral Tablet] 90 tablet 0    Sig: TAKE 1/2 TO 1 (ONE-HALF TO ONE) TABLET BY MOUTH THREE TIMES DAILY AS NEEDED      Not Delegated - Psychiatry:  Anxiolytics/Hypnotics Failed - 07/20/2019  9:19 AM      Failed - This refill cannot be delegated      Failed - Urine Drug Screen completed in last 360 days.      Failed - Valid encounter within last 6 months    Recent Outpatient Visits           9 months ago Dyspnea, unspecified type   [facility] Malva Limes, MD   1 year ago Essential hypertension   [facility] Malva Limes, MD   1 year ago Shortness of breath   [facility] Malva Limes, MD   2 years ago Atrial fibrillation, unspecified type [facility])   [facility] Malva Limes, MD   2 years ago Annual physical exam   [facility] Malva Limes, MD

## 2019-08-19 ENCOUNTER — Other Ambulatory Visit: Payer: Self-pay | Admitting: Family Medicine

## 2019-08-19 DIAGNOSIS — F419 Anxiety disorder, unspecified: Secondary | ICD-10-CM

## 2019-12-14 ENCOUNTER — Telehealth: Payer: Self-pay

## 2019-12-14 DIAGNOSIS — I10 Essential (primary) hypertension: Secondary | ICD-10-CM

## 2019-12-14 DIAGNOSIS — R7303 Prediabetes: Secondary | ICD-10-CM

## 2019-12-14 DIAGNOSIS — E785 Hyperlipidemia, unspecified: Secondary | ICD-10-CM

## 2019-12-14 NOTE — Telephone Encounter (Signed)
Copied from CRM [id number]. Topic: General - Inquiry >> Dec 14, 2019  2:49 PM Samuel Woods wrote: Reason for CRM: Patient has an office visit on 12-28-2019 he would like to go and get labs prior to his appointment. He would like a call back once the labs have been put in so he can go get them done.Please advise

## 2019-12-14 NOTE — Telephone Encounter (Signed)
Have placed future order. He can stop by anytime, needs to be fasting

## 2019-12-15 NOTE — Telephone Encounter (Signed)
Pt needs to have his labs done on or after 11/01. The future order placed states expected 12/15/19.  Is that OK? Will this order still be good then? Please let him know.

## 2019-12-20 ENCOUNTER — Other Ambulatory Visit: Payer: Self-pay

## 2019-12-20 DIAGNOSIS — R7303 Prediabetes: Secondary | ICD-10-CM

## 2019-12-20 DIAGNOSIS — E785 Hyperlipidemia, unspecified: Secondary | ICD-10-CM

## 2019-12-21 LAB — LIPID PANEL
Chol/HDL Ratio: 4.2 ratio (ref 0.0–5.0)
Cholesterol, Total: 197 mg/dL (ref 100–199)
HDL: 47 mg/dL (ref >39)
LDL Chol Calc (NIH): 119 mg/dL — ABNORMAL HIGH (ref 0–99)
Triglycerides: 176 mg/dL — ABNORMAL HIGH (ref 0–149)
VLDL Cholesterol Cal: 31 mg/dL (ref 5–40)

## 2019-12-21 LAB — CBC
Hematocrit: 41.2 % (ref 37.5–51.0)
Hemoglobin: 14.5 g/dL (ref 13.0–17.7)
MCH: 32.3 pg (ref 26.6–33.0)
MCHC: 35.2 g/dL (ref 31.5–35.7)
MCV: 92 fL (ref 79–97)
Platelets: 301 x10E3/uL (ref 150–450)
RBC: 4.49 x10E6/uL (ref 4.14–5.80)
RDW: 12.8 % (ref 11.6–15.4)
WBC: 8.5 x10E3/uL (ref 3.4–10.8)

## 2019-12-21 LAB — COMPREHENSIVE METABOLIC PANEL WITH GFR
ALT: 73 IU/L — ABNORMAL HIGH (ref 0–44)
AST: 52 IU/L — ABNORMAL HIGH (ref 0–40)
Albumin/Globulin Ratio: 1.8 (ref 1.2–2.2)
Albumin: 4.6 g/dL (ref 3.8–4.9)
Alkaline Phosphatase: 88 IU/L (ref 44–121)
BUN/Creatinine Ratio: 14 (ref 10–24)
BUN: 18 mg/dL (ref 8–27)
Bilirubin Total: 0.6 mg/dL (ref 0.0–1.2)
CO2: 22 mmol/L (ref 20–29)
Calcium: 9.4 mg/dL (ref 8.6–10.2)
Chloride: 97 mmol/L (ref 96–106)
Creatinine, Ser: 1.28 mg/dL — ABNORMAL HIGH (ref 0.76–1.27)
GFR calc Af Amer: 70 mL/min/1.73 (ref >59)
GFR calc non Af Amer: 60 mL/min/1.73 (ref >59)
Globulin, Total: 2.5 g/dL (ref 1.5–4.5)
Glucose: 148 mg/dL — ABNORMAL HIGH (ref 65–99)
Potassium: 4.2 mmol/L (ref 3.5–5.2)
Sodium: 139 mmol/L (ref 134–144)
Total Protein: 7.1 g/dL (ref 6.0–8.5)

## 2019-12-21 LAB — HEMOGLOBIN A1C
Est. average glucose Bld gHb Est-mCnc: 151 mg/dL
Hgb A1c MFr Bld: 6.9 % — ABNORMAL HIGH (ref 4.8–5.6)

## 2019-12-28 ENCOUNTER — Ambulatory Visit
Admission: RE | Admit: 2019-12-28 | Discharge: 2019-12-28 | Disposition: A | Discharge status: Home / Self Care | Payer: PPO | Attending: Family Medicine | Admitting: Family Medicine

## 2019-12-28 ENCOUNTER — Ambulatory Visit (INDEPENDENT_AMBULATORY_CARE_PROVIDER_SITE_OTHER): Payer: PPO | Admitting: Family Medicine

## 2019-12-28 ENCOUNTER — Other Ambulatory Visit: Payer: Self-pay

## 2019-12-28 ENCOUNTER — Ambulatory Visit
Admission: RE | Admit: 2019-12-28 | Discharge: 2019-12-28 | Disposition: A | Discharge status: Home / Self Care | Payer: PPO | Source: Ambulatory Visit | Attending: Family Medicine | Admitting: Family Medicine

## 2019-12-28 ENCOUNTER — Encounter: Payer: Self-pay | Admitting: Family Medicine

## 2019-12-28 VITALS — BP 131/85 | HR 74 | Temp 98.4°F | Resp 16 | Ht 72.0 in | Wt 199.6 lb

## 2019-12-28 DIAGNOSIS — R42 Dizziness and giddiness: Secondary | ICD-10-CM

## 2019-12-28 DIAGNOSIS — I4891 Unspecified atrial fibrillation: Secondary | ICD-10-CM

## 2019-12-28 DIAGNOSIS — M5441 Lumbago with sciatica, right side: Secondary | ICD-10-CM | POA: Diagnosis not present

## 2019-12-28 DIAGNOSIS — Z23 Encounter for immunization: Secondary | ICD-10-CM | POA: Diagnosis not present

## 2019-12-28 DIAGNOSIS — I5189 Other ill-defined heart diseases: Secondary | ICD-10-CM | POA: Diagnosis not present

## 2019-12-28 DIAGNOSIS — Z9889 Other specified postprocedural states: Secondary | ICD-10-CM | POA: Diagnosis not present

## 2019-12-28 DIAGNOSIS — R7303 Prediabetes: Secondary | ICD-10-CM | POA: Diagnosis not present

## 2019-12-28 DIAGNOSIS — R5383 Other fatigue: Secondary | ICD-10-CM | POA: Diagnosis not present

## 2019-12-28 DIAGNOSIS — E785 Hyperlipidemia, unspecified: Secondary | ICD-10-CM | POA: Diagnosis not present

## 2019-12-28 DIAGNOSIS — M545 Low back pain, unspecified: Secondary | ICD-10-CM | POA: Diagnosis not present

## 2019-12-28 NOTE — Progress Notes (Signed)
Established patient visit   Patient: Samuel Woods   DOB: 10/30/1960   59 y.o. Male  MRN: [id number] Visit Date: 12/28/2019  Today's healthcare provider: Mila Merry, MD   Chief Complaint  Patient presents with  . Congestive Heart Failure  . Hyperlipidemia   Subjective    HPI  Lipid/Cholesterol, Follow-up  Last lipid panel Other pertinent labs  Lab Results  Component Value Date   CHOL 197 12/20/2019   HDL 47 12/20/2019   LDLCALC 119 (H) 12/20/2019   TRIG 176 (H) 12/20/2019   CHOLHDL 4.2 12/20/2019   Lab Results  Component Value Date   ALT 73 (H) 12/20/2019   AST 52 (H) 12/20/2019   PLT 301 12/20/2019   TSH 1.500 10/12/2018     He was last seen for this 1 year ago.  Management since that visit includes increasing Atorvastatin from 20mg  to 40mg  daily.  He reports good compliance with treatment. He is not having side effects.   Symptoms: No chest pain No chest pressure/discomfort  No dyspnea No lower extremity edema  No numbness or tingling of extremity No orthopnea  No palpitations No paroxysmal nocturnal dyspnea  No speech difficulty No syncope   Current diet: well balanced Current exercise: none  The 10-year ASCVD risk score DC Jr., et al., 2013) is: 10.8%  ---------------------------------------------------------------------------------------------------  Follow up for atrial Fibrillation/chf:  The patient was last seen for this 1 year ago. Changes made at last visit include none; continue same medication.  He reports good compliance with treatment. He feels that condition is Unchanged.  He has been followed by Dr. Denman George and is s/p cardioversion in 2019 maintained in XR on amiodarone. His primary concern today is he feels weak and tired all the time. He gets dizzy when he stands up, especially at night, and has had several falls. He has long history of low back pain s/p lumbar fusion and pain has kept him from exercising which he thinks has  caused significant weight gain.  He states he feels like Dr. Welton Flakes has him overmedicated causing his weakness, dizziness, and limited his exercise tolerance. He states he felt terrible prior to his cardioversion for a-fib in 2019 and it was his decision to have the procedure.   Baseline EF on echo in 2019 was 37%, improved to 50-55% 10/2018 when he was on maintenance dose of Entresto.  ----------------------------------------------------------------------------------------- He also has history of pre-diabetes and recent a1c on 12/20/2019 was 6.9.      Medications: Outpatient Medications Prior to Visit  Medication Sig  . ALPRAZolam (XANAX) 1 MG tablet TAKE 1/2 TO 1 (ONE-HALF TO ONE) TABLET BY MOUTH THREE TIMES DAILY AS NEEDED  . amiodarone (PACERONE) 200 MG tablet Take 200 mg by mouth daily.   11/2018 apixaban (ELIQUIS) 5 MG TABS tablet Take 5 mg by mouth 2 (two) times daily.  13/02/2019 atorvastatin (LIPITOR) 40 MG tablet Take 1 tablet (40 mg total) by mouth every evening.  . carvedilol (COREG) 12.5 MG tablet Take 25 mg by mouth 2 (two) times daily.   . cyclobenzaprine (FLEXERIL) 5 MG tablet TAKE 1 TABLET BY MOUTH EVERY 6 HOURS AS NEEDED FOR MUSCLE SPASM  . ENTRESTO 97-103 MG Take 1 tablet by mouth 2 (two) times daily.   Marland Kitchen ibuprofen (ADVIL,MOTRIN) 200 MG tablet Take 400 mg by mouth every 8 (eight) hours as needed (for pain.).   Marland Kitchen Multiple Vitamin (MULTIVITAMIN WITH MINERALS) TABS tablet Take 1 tablet by mouth daily. Centrum Silver  Men 50+  . torsemide (DEMADEX) 20 MG tablet Take 20 mg by mouth 2 (two) times daily.  . [DISCONTINUED] torsemide (DEMADEX) 10 MG tablet Take 40 mg by mouth daily.   Marland Kitchen ezetimibe (ZETIA) 10 MG tablet Take 10 mg by mouth daily. (Patient not taking: Reported on 12/28/2019)  . spironolactone (ALDACTONE) 25 MG tablet Take 12.5 mg by mouth daily. (Patient not taking: Reported on 12/28/2019)   No facility-administered medications prior to visit.    Review of Systems  Constitutional:  Negative for appetite change, chills and fever.  Respiratory: Negative for chest tightness, shortness of breath and wheezing.   Cardiovascular: Negative for chest pain and palpitations.  Gastrointestinal: Negative for abdominal pain, nausea and vomiting.      Objective    BP 131/85 (BP Location: Right Arm, Patient Position: Sitting, Cuff Size: Large)   Pulse 74   Temp 98.4 F (36.9 C) (Oral)   Resp 16   Ht 6' (1.829 m)   Wt 199 lb 9.6 oz (90.5 kg)   BMI 27.07 kg/m    Physical Exam   General: Appearance:     Overweight male in no acute distress  Eyes:    PERRL, conjunctiva/corneas clear, EOM's intact       Lungs:     Clear to auscultation bilaterally, respirations unlabored  Heart:    Normal heart rate. Regular rhythm.  3/6 blowing, holosystolic murmur at apex  MS:   All extremities are intact.   Neurologic:   Awake, alert, oriented x 3. No apparent focal neurological           defect.         Assessment & Plan     1. Other fatigue   2. Dizziness  Multifactorial. Likely some deconditioning due to physical inactivity. Limited due to back pain. Likely some medication induced orthostasis. Counseled on cardiac benefits of Entresto. He could likely get by with significantly lower dose of torsemide as he is taking it at least twice a day regardless of edema. Advised to cut back to one every morning for the time being and only take more if develops s/s of volume overload. Continue current dose of Entresto for the time being.   3. Atrial fibrillation, unspecified type (HCC) Has been maintained in SR on amiodarone since cardioversion by Dr. Welton Flakes in 2019. He feels he is overmedicated and would like to see another cardiologist for second opinion and consider reducing his medications.   - Ambulatory referral to Cardiology  4. Diastolic dysfunction  - Ambulatory referral to Cardiology  5. 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 He had previously done well with low dose of daily opioids. Will check - DG Lumbar Spine Complete; Future If stable he would probably be able to be more active with some medication management of chronic back pain.   6. S/P MVR (mitral valve repair)   7. Hyperlipidemia, unspecified hyperlipidemia type He is tolerating atorvastatin well with no adverse effects.    8. Prediabetes His to work on increasing physical activity and losing weight. If A1c remains above 6.5 at follow up in 3 months will consider adding metformin   9. Need for influenza vaccination  - Flu Vaccine QUAD 36+ mos IM (Fluarix/Fluzone)  Recommend he get Covid vaccine booster since he only had one dose of J&J vaccine.         The entirety of the information documented in  the History of Present Illness, Review of Systems and Physical Exam were personally obtained by me. Portions of this information were initially documented by the CMA and reviewed by me for thoroughness and accuracy.      Lelon Huh, MD  [facility] [phone] (phone) [phone] (fax)  [place]

## 2019-12-28 NOTE — Patient Instructions (Signed)
.   Please review the attached list of medications and notify my office if there are any errors.   . Reduce torsemide to one tablet every morning. Only take a second one if you have an unusual amount of swelling in your legs that doesn't go down when you elevated your legs.

## 2020-01-04 ENCOUNTER — Encounter: Payer: Self-pay | Admitting: Family Medicine

## 2020-01-04 DIAGNOSIS — I700 Atherosclerosis of aorta: Secondary | ICD-10-CM | POA: Insufficient documentation

## 2020-01-06 ENCOUNTER — Other Ambulatory Visit: Payer: Self-pay | Admitting: Family Medicine

## 2020-01-06 DIAGNOSIS — F419 Anxiety disorder, unspecified: Secondary | ICD-10-CM

## 2020-01-06 NOTE — Telephone Encounter (Signed)
Requested medications are due for refill today yes  Requested medications are on the active medication list yes  Last refill 11/1719  Last visit Can not find visit addressing this med/dx  Future visit scheduled 03/2020  Notes to clinic Please assess

## 2020-01-11 ENCOUNTER — Other Ambulatory Visit: Payer: Self-pay | Admitting: Family Medicine

## 2020-01-11 DIAGNOSIS — F419 Anxiety disorder, unspecified: Secondary | ICD-10-CM

## 2020-01-11 NOTE — Telephone Encounter (Signed)
Requested medication (s) are due for refill today: Yes  Requested medication (s) are on the active medication list: Yes  Last refill:  08/19/19  Future visit scheduled: Yes  Notes to clinic:  Unable to refill per protocol, cannot delegate     Requested Prescriptions  Pending Prescriptions Disp Refills   ALPRAZolam (XANAX) 1 MG tablet [Pharmacy Med Name: ALPRAZolam 1 MG Oral Tablet] 90 tablet 0    Sig: TAKE 1/2 TO 1 (ONE-HALF TO ONE) TABLET BY MOUTH THREE TIMES DAILY AS NEEDED      Not Delegated - Psychiatry:  Anxiolytics/Hypnotics Failed - 01/11/2020  4:20 PM      Failed - This refill cannot be delegated      Failed - Urine Drug Screen completed in last 360 days      Passed - Valid encounter within last 6 months    Recent Outpatient Visits           2 weeks ago Other fatigue   [facility] Malva Limes, MD   1 year ago Dyspnea, unspecified type   [facility] Malva Limes, MD   1 year ago Essential hypertension   [facility] Malva Limes, MD   2 years ago Shortness of breath   [facility] Malva Limes, MD   2 years ago Atrial fibrillation, unspecified type [facility])   [facility] Malva Limes, MD       Future Appointments             In 1 week Gollan, Tollie Pizza, MD [id number]   In 2 months Fisher, Demetrios Isaacs, MD [facility], [facility]

## 2020-01-17 NOTE — Progress Notes (Signed)
Cardiology Office Note  Date:  01/18/2020   ID:  Athan, Casalino 28-Jul-1960, MRN [id number]  PCP:  Malva Limes, MD   Chief Complaint  Patient presents with  . New Patient (Initial Visit)    Ref by Dr. Mila Merry for A-fib; hx with Dr. Adrian Blackwater. Meds reviewed by the pt.'s bottles. Pt. c/o dizziness with a change in positions and has shortness of breath mostly with exertion.      HPI:  Mr. Aydian Dimmick is a 59 year old gentleman with past medical history of Hyperlipidemia Atrial fibrillation moderate to severe mitral valve regurgitation and tricuspid valve regurgitation  MVR 2004 Prediabetes Lumbar fusion surgery/low back pain Who presents by referral from Dr. Sherrie Mustache for consultation of his atrial fibrillation and diastolic dysfunction, mitral valve regurgitation  Extensive review of his cardiac history Weak and tired all the time Dizziness when he stands up especially at nighttime, has had several falls  s/p cardioversion in 2019 maintained in XR on amiodarone  echo in 2019 was 37%, improved to 50-55% 10/2018 on maintenance dose of Entresto.   He also has history of pre-diabetes and recent a1c on 12/20/2019 was 6.9.   Echo by Dr. Welton Flakes in 2020: EF 50 to 55% Moderate to severe MR and TR Echocardiogram 2019, no significant mitral or tricuspid valve regurgitation  Has not worked at [employer] for 2 years, out on disability  Orthostatics done in the office today Supine 122/83 pulse 65 Sitting 120/90 pulse 67 Standing 112/82 heart rate 71 Standing 3 minutes 121/78 pulse 65  EKG personally reviewed by myself on todays visit Shows normal sinus rhythm rate 65 bpm consider old anterior MI, old inferior MI  PMH:   has a past medical history of Afib (HCC), History of chicken pox, History of lung abscess (01/18/2009), and History of measles.  PSH:    Past Surgical History:  Procedure Laterality Date  . CARDIOVERSION Right 11/17/2017   Procedure: CARDIOVERSION;  Surgeon:  Laurier Nancy, MD;  Location: [facility] ORS;  Service: Cardiovascular;  Laterality: Right;  . DOPPLER ECHOCARDIOGRAPHY  08/12/2007   Mild to moderate stenosis. Trace TR.Mild pulmonary hypertension. Borderline LVH. Mild depressed right systolic function. LVEF=50-55%  . MITRAL VALVE REPAIR  05/2002  . SPINAL FUSION    . TEE WITHOUT CARDIOVERSION N/A 11/17/2017   Procedure: TRANSESOPHAGEAL ECHOCARDIOGRAM (TEE);  Surgeon: Laurier Nancy, MD;  Location: [facility] ORS;  Service: Cardiovascular;  Laterality: N/A;    Current Outpatient Medications  Medication Sig Dispense Refill  . ALPRAZolam (XANAX) 1 MG tablet TAKE 1/2 TO 1 (ONE-HALF TO ONE) TABLET BY MOUTH THREE TIMES DAILY AS NEEDED 90 tablet 3  . amiodarone (PACERONE) 200 MG tablet Take 200 mg by mouth daily.   0  . apixaban (ELIQUIS) 5 MG TABS tablet Take 5 mg by mouth 2 (two) times daily.    Marland Kitchen atorvastatin (LIPITOR) 40 MG tablet Take 1 tablet (40 mg total) by mouth every evening. 90 tablet 4  . carvedilol (COREG) 12.5 MG tablet Take 25 mg by mouth 2 (two) times daily.   1  . cyclobenzaprine (FLEXERIL) 5 MG tablet TAKE 1 TABLET BY MOUTH EVERY 6 HOURS AS NEEDED FOR MUSCLE SPASM 60 tablet 5  . ENTRESTO 97-103 MG Take 0.5 tablets by mouth 2 (two) times daily. 60 tablet 3  . ibuprofen (ADVIL,MOTRIN) 200 MG tablet Take 400 mg by mouth every 8 (eight) hours as needed (for pain.).     Marland Kitchen Multiple Vitamin (MULTIVITAMIN WITH MINERALS) TABS tablet  Take 1 tablet by mouth daily. Centrum Silver Men 50+    . torsemide (DEMADEX) 20 MG tablet Take 20 mg by mouth 2 (two) times daily.      No current facility-administered medications for this visit.     Allergies:   Dilaudid [hydromorphone hcl]   Social History:  The patient  reports that he quit smoking about 2 years ago. He has a 7.50 pack-year smoking history. He has never used smokeless tobacco. He reports current alcohol use. He reports that he does not use drugs.   Family History:   family history includes  Alcohol abuse in his father; Cancer in an other family member; Hypertension in his brother.    Review of Systems: Review of Systems  Constitutional: Negative.   HENT: Negative.   Respiratory: Negative.   Cardiovascular: Negative.   Gastrointestinal: Negative.   Musculoskeletal: Negative.        Gait instability  Neurological: Positive for dizziness.  Psychiatric/Behavioral: Negative.   All other systems reviewed and are negative.   PHYSICAL EXAM: VS:  BP 110/80 (BP Location: Right Arm, Patient Position: Sitting, Cuff Size: Normal)   Pulse 65   Ht 6' (1.829 m)   Wt 201 lb 8 oz (91.4 kg)   SpO2 98%   BMI 27.33 kg/m  , BMI Body mass index is 27.33 kg/m. GEN: Well nourished, well developed, in no acute distress HEENT: normal Neck: no JVD, carotid bruits, or masses Cardiac: RRR; no murmurs, rubs, or gallops,no edema  Respiratory:  clear to auscultation bilaterally, normal work of breathing GI: soft, nontender, nondistended, + BS MS: no deformity or atrophy Skin: warm and dry, no rash Neuro:  Strength and sensation are intact Psych: euthymic mood, full affect  Recent Labs: 12/20/2019: ALT 73; BUN 18; Creatinine, Ser 1.28; Hemoglobin 14.5; Platelets 301; Potassium 4.2; Sodium 139    Lipid Panel Lab Results  Component Value Date   CHOL 197 12/20/2019   HDL 47 12/20/2019   LDLCALC 119 (H) 12/20/2019   TRIG 176 (H) 12/20/2019      Wt Readings from Last 3 Encounters:  01/18/20 201 lb 8 oz (91.4 kg)  12/28/19 199 lb 9.6 oz (90.5 kg)  10/12/18 191 lb (86.6 kg)     ASSESSMENT AND PLAN:  Problem List Items Addressed This Visit      Cardiology Problems   Atrial fibrillation (HCC) - Primary   Relevant Medications   ENTRESTO 97-103 MG   Other Relevant Orders   EKG 12-Lead   Mitral regurgitation   Relevant Medications   ENTRESTO 97-103 MG   HLD (hyperlipidemia)   Relevant Medications   ENTRESTO 97-103 MG   Hypertension   Relevant Medications   ENTRESTO 97-103 MG      Paroxysmal atrial fibrillation Maintaining normal sinus rhythm Recommend he continue low-dose amiodarone with carvedilol  Dizziness Mild orthostasis on today's visit Continue current doses of carvedilol to maintain sinus rhythm Given low normal ejection fraction 50 to 55%, and low pressure, recommended he cut his Entresto in half 49/51 mg p.o. twice daily Recommend he monitor blood pressure at home  Mitral valve regurgitation/s/p MVR Discrepancy between 2019 and 2020 echo was done at outside facility Moderate to severe MR and TR noted on 2020 echo with severely elevated right heart pressures of 60 mmHg Currently with no leg swelling, no abdominal distention concerning for heart failure symptoms -Repeat echocardiogram has been ordered to reevaluate mitral valve  Tricuspid valve regurgitation Moderate to severe on outside echocardiogram 2020 Echocardiogram  as above  Debility Has been sedentary for at least 2 years since he stopped work at [employer] Following echocardiogram, cut back on his blood pressure pill as above for his dizziness, would like to get him involved in a exercise program, We can place order for cardiac rehab   Patient was seen in consultation by Dr. Sherrie Mustache be referred back to his office for ongoing care of the issues detailed above   Total encounter time more than 60 minutes  Greater than 50% was spent in counseling and coordination of care with the patient    Signed, Dossie Arbour, M.D., Ph.D. [facility] [place], [place] [phone]

## 2020-01-18 ENCOUNTER — Ambulatory Visit: Payer: PPO | Admitting: Cardiovascular Disease

## 2020-01-18 ENCOUNTER — Encounter: Payer: Self-pay | Admitting: Cardiovascular Disease

## 2020-01-18 ENCOUNTER — Other Ambulatory Visit: Payer: Self-pay

## 2020-01-18 VITALS — BP 110/80 | HR 65 | Ht 72.0 in | Wt 201.5 lb

## 2020-01-18 DIAGNOSIS — E782 Mixed hyperlipidemia: Secondary | ICD-10-CM | POA: Diagnosis not present

## 2020-01-18 DIAGNOSIS — I10 Essential (primary) hypertension: Secondary | ICD-10-CM | POA: Diagnosis not present

## 2020-01-18 DIAGNOSIS — I340 Nonrheumatic mitral (valve) insufficiency: Secondary | ICD-10-CM | POA: Diagnosis not present

## 2020-01-18 DIAGNOSIS — I4819 Other persistent atrial fibrillation: Secondary | ICD-10-CM

## 2020-01-18 NOTE — Patient Instructions (Addendum)
Medication Instructions:   Dr. Mariah Milling recommends that your Samuel Woods to be reduce with taking half the pill in the morning and the other half in the afternoon   If you need a refill on your cardiac medications before your next appointment, please call your pharmacy.    Lab work: No new labs needed   If you have labs (blood work) drawn today and your tests are completely normal, you will receive your results only by: Marland Kitchen MyChart Message (if you have MyChart) OR . A paper copy in the mail If you have any lab test that is abnormal or we need to change your treatment, we will call you to review the results.   Testing/Procedures: Echo for mitral valve replacement, atrial fib  Your physician has requested that you have an echocardiogram. Echocardiography is a painless test that uses sound waves to create images of your heart. It provides your doctor with information about the size and shape of your heart and how well your heart's chambers and valves are working. This procedure takes approximately one hour. There are no restrictions for this procedure.  There is a possibility that an IV may need to be started during your test to inject an image enhancing agent. This is done to obtain more optimal pictures of your heart. Therefore we ask that you do at least drink some water prior to coming in to hydrate your veins.     Follow-Up: At [facility], you and your health needs are our priority.  As part of our continuing mission to provide you with exceptional heart care, we have created designated Provider Care Teams.  These Care Teams include your primary Cardiologist (physician) and Advanced Practice Providers (APPs -  Physician Assistants and Nurse Practitioners) who all work together to provide you with the care you need, when you need it.  . You will need a follow up appointment in 6 months  . Providers on your designated Care Team:   . Nicolasa Ducking, NP . Eula Listen, PA-C . Marisue Ivan, PA-C  Any Other Special Instructions Will Be Listed Below (If Applicable).  COVID-19 Vaccine Information can be found at: [email or web address] For questions related to vaccine distribution or appointments, please email [email or web address] or call [phone].    Echocardiogram An echocardiogram is a procedure that uses painless sound waves (ultrasound) to produce an image of the heart. Images from an echocardiogram can provide important information about:  Signs of coronary artery disease (CAD).  Aneurysm detection. An aneurysm is a weak or damaged part of an artery wall that bulges out from the normal force of blood pumping through the body.  Heart size and shape. Changes in the size or shape of the heart can be associated with certain conditions, including heart failure, aneurysm, and CAD.  Heart muscle function.  Heart valve function.  Signs of a past heart attack.  Fluid buildup around the heart.  Thickening of the heart muscle.  A tumor or infectious growth around the heart valves. Tell a health care provider about:  Any allergies you have.  All medicines you are taking, including vitamins, herbs, eye drops, creams, and over-the-counter medicines.  Any blood disorders you have.  Any surgeries you have had.  Any medical conditions you have.  Whether you are pregnant or may be pregnant. What are the risks? Generally, this is a safe procedure. However, problems may occur, including:  Allergic reaction to dye (contrast) that may be used during the procedure. What happens before the procedure?  No specific preparation is needed. You may eat and drink normally. What happens during the procedure?   An IV tube may be inserted into one of your veins.  You may receive contrast through this tube. A contrast is an injection that improves the quality of the pictures from your heart.  A gel will be applied  to your chest.  A wand-like tool (transducer) will be moved over your chest. The gel will help to transmit the sound waves from the transducer.  The sound waves will harmlessly bounce off of your heart to allow the heart images to be captured in real-time motion. The images will be recorded on a computer. The procedure may vary among health care providers and hospitals. What happens after the procedure?  You may return to your normal, everyday life, including diet, activities, and medicines, unless your health care provider tells you not to do that. Summary  An echocardiogram is a procedure that uses painless sound waves (ultrasound) to produce an image of the heart.  Images from an echocardiogram can provide important information about the size and shape of your heart, heart muscle function, heart valve function, and fluid buildup around your heart.  You do not need to do anything to prepare before this procedure. You may eat and drink normally.  After the echocardiogram is completed, you may return to your normal, everyday life, unless your health care provider tells you not to do that. This information is not intended to replace advice given to you by your health care provider. Make sure you discuss any questions you have with your health care provider. Document Revised: 05/28/2018 Document Reviewed: 03/09/2016 Elsevier Patient Education  2020 [employer].

## 2020-01-21 ENCOUNTER — Other Ambulatory Visit: Payer: Self-pay | Admitting: Cardiovascular Disease

## 2020-01-21 DIAGNOSIS — E785 Hyperlipidemia, unspecified: Secondary | ICD-10-CM

## 2020-01-21 MED ORDER — ATORVASTATIN CALCIUM 40 MG PO TABS
40.0000 mg | ORAL_TABLET | Freq: Every evening | ORAL | 1 refills | Status: DC
Start: 2020-01-21 — End: 2020-08-17

## 2020-01-21 MED ORDER — AMIODARONE HCL 200 MG PO TABS
200.0000 mg | ORAL_TABLET | Freq: Every day | ORAL | 1 refills | Status: DC
Start: 2020-01-21 — End: 2020-08-17

## 2020-01-21 NOTE — Telephone Encounter (Signed)
*  STAT* If patient is at the pharmacy, call can be transferred to refill team.   1. Which medications need to be refilled? (please list name of each medication and dose if known) atorvastatin 40 mg, pacerone 200 mg  2. Which pharmacy/location (including street and city if local pharmacy) is medication to be sent to? [facility] [address]  3. Do they need a 30 day or 90 day supply? 90

## 2020-01-21 NOTE — Telephone Encounter (Signed)
Requested Prescriptions   Signed Prescriptions Disp Refills   amiodarone (PACERONE) 200 MG tablet 90 tablet 1    Sig: Take 1 tablet (200 mg total) by mouth daily.    Authorizing Provider: Antonieta Iba    Ordering User: Thayer Headings, Shakeitha Umbaugh L   atorvastatin (LIPITOR) 40 MG tablet 90 tablet 1    Sig: Take 1 tablet (40 mg total) by mouth every evening.    Authorizing Provider: Antonieta Iba    Ordering User: Thayer Headings, Lendon George L

## 2020-02-09 ENCOUNTER — Other Ambulatory Visit: Payer: Self-pay

## 2020-02-09 ENCOUNTER — Ambulatory Visit (INDEPENDENT_AMBULATORY_CARE_PROVIDER_SITE_OTHER): Payer: PPO

## 2020-02-09 DIAGNOSIS — I340 Nonrheumatic mitral (valve) insufficiency: Secondary | ICD-10-CM

## 2020-02-09 DIAGNOSIS — I4819 Other persistent atrial fibrillation: Secondary | ICD-10-CM

## 2020-02-09 LAB — ECHOCARDIOGRAM COMPLETE
AR max vel: 3.47 cm2
AV Area VTI: 3.28 cm2
AV Area mean vel: 3.38 cm2
AV Mean grad: 3 mmHg
AV Peak grad: 6.6 mmHg
Ao pk vel: 1.28 m/s
Area-P 1/2: 2.29 cm2
Calc EF: 56 %
S' Lateral: 3.3 cm
Single Plane A2C EF: 58.7 %
Single Plane A4C EF: 53.6 %

## 2020-02-14 ENCOUNTER — Telehealth: Payer: Self-pay | Admitting: Cardiovascular Disease

## 2020-02-14 ENCOUNTER — Other Ambulatory Visit: Payer: Self-pay | Admitting: Family Medicine

## 2020-02-14 DIAGNOSIS — I5189 Other ill-defined heart diseases: Secondary | ICD-10-CM

## 2020-02-14 DIAGNOSIS — M545 Low back pain, unspecified: Secondary | ICD-10-CM

## 2020-02-14 MED ORDER — CARVEDILOL 12.5 MG PO TABS
25.0000 mg | ORAL_TABLET | Freq: Two times a day (BID) | ORAL | 1 refills | Status: DC
Start: 2020-02-14 — End: 2021-03-05

## 2020-02-14 MED ORDER — TORSEMIDE 20 MG PO TABS: 20.0000 mg | ORAL_TABLET | Freq: Two times a day (BID) | ORAL | 1 refills | Status: DC

## 2020-02-14 NOTE — Telephone Encounter (Signed)
Rx request sent to pharmacy.  

## 2020-02-14 NOTE — Telephone Encounter (Signed)
Requested medication (s) are due for refill today: yes  Requested medication (s) are on the active medication list: yes  Last refill:  05/15/19 #60 with 5 refills   Future visit scheduled: yes  Notes to clinic:  Please review for refill. Refill not delegated per protocol    Requested Prescriptions  Pending Prescriptions Disp Refills   cyclobenzaprine (FLEXERIL) 5 MG tablet [Pharmacy Med Name: Cyclobenzaprine HCl 5 MG Oral Tablet] 60 tablet 0    Sig: TAKE 1 TABLET BY MOUTH EVERY 6 HOURS AS NEEDED FOR MUSCLE SPASM      Not Delegated - Analgesics:  Muscle Relaxants Failed - 02/14/2020  9:22 AM      Failed - This refill cannot be delegated      Passed - Valid encounter within last 6 months    Recent Outpatient Visits           1 month ago Other fatigue   [facility] Malva Limes, MD   1 year ago Dyspnea, unspecified type   [facility] Malva Limes, MD   1 year ago Essential hypertension   [facility] Malva Limes, MD   2 years ago Shortness of breath   [facility] Malva Limes, MD   2 years ago Atrial fibrillation, unspecified type [facility])   [facility] Malva Limes, MD       Future Appointments             In 1 month Fisher, Demetrios Isaacs, MD [facility], [facility]   In 5 months Gollan, Tollie Pizza, MD [facility], [facility]

## 2020-02-14 NOTE — Telephone Encounter (Signed)
Patient calling  Would like to make sure he will be called with echo results and not just put on mychart  Please call when available

## 2020-02-14 NOTE — Telephone Encounter (Signed)
°*  STAT* If patient is at the pharmacy, call can be transferred to refill team.   1. Which medications need to be refilled? (please list name of each medication and dose if known)  Carvedilol 12.5 mg  Torsemide 20 MG   2. Which pharmacy/location (including street and city if local pharmacy) is medication to be sent to? [facility] on [address]   3. Do they need a 30 day or 90 day supply? 90 day

## 2020-02-21 ENCOUNTER — Telehealth: Payer: Self-pay

## 2020-02-21 NOTE — Telephone Encounter (Signed)
-----   Message from Antonieta Iba, MD sent at 02/20/2020  4:55 PM EST ----- Echocardiogram Normal ejection fraction Mitral valve leakage appears to be improved compared to prior echo, now moderate Also tricuspid valve leakage much better, now mild

## 2020-02-21 NOTE — Telephone Encounter (Signed)
Left detail message on VM, okay by DPR, echo report was describe on VM of what Dr. Mariah Milling reported as  "Normal ejection fraction Mitral valve leakage appears to be improved compared to prior echo, now moderate Also tricuspid valve leakage much better, now mild" Advised no changes to plan of care or medications at this time, if have any questions, please call the office, otherwise will see at next appt.

## 2020-04-03 ENCOUNTER — Ambulatory Visit (INDEPENDENT_AMBULATORY_CARE_PROVIDER_SITE_OTHER): Payer: PPO | Admitting: Family Medicine

## 2020-04-03 ENCOUNTER — Other Ambulatory Visit: Payer: Self-pay

## 2020-04-03 ENCOUNTER — Encounter: Payer: Self-pay | Admitting: Family Medicine

## 2020-04-03 VITALS — BP 110/74 | HR 71 | Temp 97.3°F | Resp 18 | Wt 203.0 lb

## 2020-04-03 DIAGNOSIS — M5441 Lumbago with sciatica, right side: Secondary | ICD-10-CM | POA: Diagnosis not present

## 2020-04-03 DIAGNOSIS — E119 Type 2 diabetes mellitus without complications: Secondary | ICD-10-CM

## 2020-04-03 DIAGNOSIS — M4710 Other spondylosis with myelopathy, site unspecified: Secondary | ICD-10-CM

## 2020-04-03 DIAGNOSIS — Z86010 Personal history of colonic polyps: Secondary | ICD-10-CM

## 2020-04-03 DIAGNOSIS — Z1211 Encounter for screening for malignant neoplasm of colon: Secondary | ICD-10-CM

## 2020-04-03 LAB — POCT GLYCOSYLATED HEMOGLOBIN (HGB A1C)
Est. average glucose Bld gHb Est-mCnc: 157
Hemoglobin A1C: 7.1 % — AB (ref 4.0–5.6)

## 2020-04-03 NOTE — Progress Notes (Signed)
Established patient visit   Patient: Samuel Woods   DOB: 1960/10/18   60 y.o. Male  MRN: [id number] Visit Date: 04/03/2020  Today's healthcare provider: Mila Merry, MD   Chief Complaint  Patient presents with  . Prediabetes  . Anxiety   Subjective    HPI  Prediabetes, Follow-up  Lab Results  Component Value Date   HGBA1C 6.9 (H) 12/20/2019   HGBA1C 5.8 (A) 03/24/2018   HGBA1C 5.8 (H) 12/13/2015   GLUCOSE 148 (H) 12/20/2019   GLUCOSE 122 (H) 10/12/2018   GLUCOSE 90 03/24/2018    Last seen for for this 3 months ago.  Management since that visit includes advising patient to work on increasing physical activity and losing weight. If A1c remains above 6.5 at follow up in 3 months will consider adding metformin . Current symptoms include none and have been stable.  Prior visit with dietician: no Current diet: well balanced Current exercise: none  Pertinent Labs:    Component Value Date/Time   CHOL 197 12/20/2019 0951   TRIG 176 (H) 12/20/2019 0951   CHOLHDL 4.2 12/20/2019 0951   CREATININE 1.28 (H) 12/20/2019 0951    Wt Readings from Last 3 Encounters:  04/03/20 203 lb (92.1 kg)  01/18/20 201 lb 8 oz (91.4 kg)  12/28/19 199 lb 9.6 oz (90.5 kg)    ----------------------------------------------------------------------------------------- Anxiety, Follow-up  He was last seen for anxiety more than 3 months ago.   Changes made at last visit include none.   He reports good compliance with treatment. He reports good tolerance of treatment. He is not having side effects.   He feels his anxiety is mild and Unchanged since last visit.  Symptoms: No chest pain No difficulty concentrating  No dizziness No fatigue  No feelings of losing control No insomnia  No irritable No palpitations  No panic attacks No racing thoughts  No shortness of breath No sweating  No tremors/shakes    GAD-7 Results No flowsheet data found.  PHQ-9 Scores PHQ9 SCORE ONLY  04/03/2020 12/28/2019 06/16/2018  PHQ-9 Total Score 5 8 8     --------------------------------------------------------------------------------------------------- Follow up back pain Continues to have significant lower back pain sometimes with radiation, last xr 12/29/19 Mild disc space narrowing from L1-2 through L4-5. Mild facet osteoarthritis in the lower lumbar spine. Findings worsened since 2014. Was previously taking oxycodone/apap daily which was effective but he was concerned about dependence. Is now significantly limited in his physical activity from pain.    Medications: Outpatient Medications Prior to Visit  Medication Sig  . ALPRAZolam (XANAX) 1 MG tablet TAKE 1/2 TO 1 (ONE-HALF TO ONE) TABLET BY MOUTH THREE TIMES DAILY AS NEEDED  . amiodarone (PACERONE) 200 MG tablet Take 1 tablet (200 mg total) by mouth daily.  2015 apixaban (ELIQUIS) 5 MG TABS tablet Take 5 mg by mouth 2 (two) times daily.  Marland Kitchen atorvastatin (LIPITOR) 40 MG tablet Take 1 tablet (40 mg total) by mouth every evening.  . carvedilol (COREG) 12.5 MG tablet Take 2 tablets (25 mg total) by mouth 2 (two) times daily.  . cyclobenzaprine (FLEXERIL) 5 MG tablet TAKE 1 TABLET BY MOUTH EVERY 6 HOURS AS NEEDED FOR MUSCLE SPASM  . ENTRESTO 97-103 MG Take 0.5 tablets by mouth 2 (two) times daily.  Marland Kitchen ibuprofen (ADVIL,MOTRIN) 200 MG tablet Take 400 mg by mouth every 8 (eight) hours as needed (for pain.).   Marland Kitchen Multiple Vitamin (MULTIVITAMIN WITH MINERALS) TABS tablet Take 1 tablet by mouth daily. Centrum  Silver Men 50+  . torsemide (DEMADEX) 20 MG tablet Take 1 tablet (20 mg total) by mouth 2 (two) times daily.   No facility-administered medications prior to visit.    Review of Systems  Constitutional: Negative for appetite change, chills and fever.  Respiratory: Negative for chest tightness, shortness of breath and wheezing.   Cardiovascular: Negative for chest pain and palpitations.  Gastrointestinal: Negative for abdominal pain,  nausea and vomiting.  Musculoskeletal: Positive for back pain.       Objective    BP 110/74 (BP Location: Right Arm, Patient Position: Sitting, Cuff Size: Normal)   Pulse 71   Temp (!) 97.3 F (36.3 C) (Temporal)   Resp 18   Wt 203 lb (92.1 kg)   BMI 27.53 kg/m     Physical Exam   General: Appearance:     Overweight male in no acute distress  Eyes:    PERRL, conjunctiva/corneas clear, EOM's intact              Results for orders placed or performed in visit on 04/03/20  POCT HgB A1C  Result Value Ref Range   Hemoglobin A1C 7.1 (A) 4.0 - 5.6 %   Est. average glucose Bld gHb Est-mCnc 157     Assessment & Plan     1. Type 2 diabetes mellitus without complication, without long-term current use of insulin (HCC) Newly diagnosed.  - Ambulatory referral to diabetic education  2. Spondylosis with myelopathy Significantly limited physical activity and impairing weight loss efforts.  - Ambulatory referral to Physical Therapy  3. Midline low back pain with right-sided sciatica, unspecified chronicity  - Ambulatory referral to Physical Therapy  4. H/O adenomatous polyp of colon Tubular adenoma excised by Dr. Lemar Livings in 2013, due for follow up - Ambulatory referral to Gastroenterology  5. Colon cancer screening  - Ambulatory referral to Gastroenterology   Future Appointments  Date Time Provider Department Center  07/11/2020  4:00 PM Malva Limes, MD [facility]  07/18/2020  4:00 PM Gollan, Tollie Pizza, MD [facility]         The entirety of the information documented in the History of Present Illness, Review of Systems and Physical Exam were personally obtained by me. Portions of this information were initially documented by the CMA and reviewed by me for thoroughness and accuracy.      Mila Merry, MD  [facility] [phone] (phone) [phone] (fax)  [facility]

## 2020-04-06 ENCOUNTER — Telehealth: Payer: Self-pay

## 2020-04-06 DIAGNOSIS — G8929 Other chronic pain: Secondary | ICD-10-CM

## 2020-04-06 DIAGNOSIS — M4710 Other spondylosis with myelopathy, site unspecified: Secondary | ICD-10-CM

## 2020-04-06 DIAGNOSIS — M5440 Lumbago with sciatica, unspecified side: Secondary | ICD-10-CM

## 2020-04-06 NOTE — Telephone Encounter (Signed)
I spoke with the patient and he said he can not afford the $50 co-pay for all the PT visits.  He said he does not want to go on narcotic pain medicine but would like to explore other options to control the pain.  Would like referral for pain management

## 2020-04-06 NOTE — Telephone Encounter (Signed)
Copied from CRM [id number]. Topic: General - Other >> Apr 06, 2020 11:29 AM Tamela Oddi wrote: Reason for CRM: Patient would like the nurse to call regarding his scheduled PT visits because there is a charge each time he has an appt.  Patient stated he cannot afford the payments.  Patient would like to discuss an alternative.  Please call to discuss at [phone]

## 2020-04-07 NOTE — Addendum Note (Signed)
Addended by: Malva Limes on: 04/07/2020 07:05 AM   Modules accepted: Orders

## 2020-04-10 ENCOUNTER — Ambulatory Visit: Payer: PPO

## 2020-04-10 ENCOUNTER — Other Ambulatory Visit: Payer: Self-pay

## 2020-04-10 ENCOUNTER — Telehealth (INDEPENDENT_AMBULATORY_CARE_PROVIDER_SITE_OTHER): Payer: Self-pay | Admitting: Gastroenterology

## 2020-04-10 DIAGNOSIS — Z86010 Personal history of colonic polyps: Secondary | ICD-10-CM

## 2020-04-10 MED ORDER — NA SULFATE-K SULFATE-MG SULF 17.5-3.13-1.6 GM/177ML PO SOLN: 1.0000 | Freq: Once | ORAL | 0 refills | Status: AC

## 2020-04-10 NOTE — Progress Notes (Signed)
Gastroenterology Pre-Procedure Review  Request Date: Friday 05/05/20 Requesting Physician: Dr. Servando Snare  PATIENT REVIEW QUESTIONS: The patient responded to the following health history questions as indicated:    1. Are you having any GI issues? no 2. Do you have a personal history of Polyps? yes (08/23/11 Dr.Byrnett performed colonoscopy.  chart noted adenomatous polyps) 3. Do you have a family history of Colon Cancer or Polyps? no 4. Diabetes Mellitus? no 5. Joint replacements in the past 12 months?no 6. Major health problems in the past 3 months?Pt has A-Fib.  Cardiac request will be sent to Dr. Mariah Milling. 7. Any artificial heart valves, MVP, or defibrillator?no    MEDICATIONS & ALLERGIES:    Patient reports the following regarding taking any anticoagulation/antiplatelet therapy:   Plavix, Coumadin, Eliquis, Xarelto, Lovenox, Pradaxa, Brilinta, or Effient? yes (Pt takes Eliquis prescribed by Dr. Mariah Milling.  Blood thinner request will be sent.) Aspirin? no  Patient confirms/reports the following medications:  Current Outpatient Medications  Medication Sig Dispense Refill  . ALPRAZolam (XANAX) 1 MG tablet TAKE 1/2 TO 1 (ONE-HALF TO ONE) TABLET BY MOUTH THREE TIMES DAILY AS NEEDED 90 tablet 3  . amiodarone (PACERONE) 200 MG tablet Take 1 tablet (200 mg total) by mouth daily. 90 tablet 1  . apixaban (ELIQUIS) 5 MG TABS tablet Take 5 mg by mouth 2 (two) times daily.    Marland Kitchen atorvastatin (LIPITOR) 40 MG tablet Take 1 tablet (40 mg total) by mouth every evening. 90 tablet 1  . carvedilol (COREG) 12.5 MG tablet Take 2 tablets (25 mg total) by mouth 2 (two) times daily. 360 tablet 1  . cyclobenzaprine (FLEXERIL) 5 MG tablet TAKE 1 TABLET BY MOUTH EVERY 6 HOURS AS NEEDED FOR MUSCLE SPASM 60 tablet 5  . ENTRESTO 97-103 MG Take 0.5 tablets by mouth 2 (two) times daily. 60 tablet 3  . ibuprofen (ADVIL,MOTRIN) 200 MG tablet Take 400 mg by mouth every 8 (eight) hours as needed (for pain.).     Marland Kitchen Multiple  Vitamin (MULTIVITAMIN WITH MINERALS) TABS tablet Take 1 tablet by mouth daily. Centrum Silver Men 50+    . torsemide (DEMADEX) 20 MG tablet Take 1 tablet (20 mg total) by mouth 2 (two) times daily. 180 tablet 1   No current facility-administered medications for this visit.    Patient confirms/reports the following allergies:  Allergies  Allergen Reactions  . Dilaudid [Hydromorphone Hcl] Other (See Comments)    According to patient's wife he had hallucinations after having dilaudid    No orders of the defined types were placed in this encounter.   AUTHORIZATION INFORMATION Primary Insurance: 1D#: Group #:  Secondary Insurance: 1D#: Group #:  SCHEDULE INFORMATION: Date: Friday 05/05/20 Time: Location:[facility]

## 2020-04-14 ENCOUNTER — Other Ambulatory Visit: Payer: Self-pay

## 2020-04-14 DIAGNOSIS — Z86010 Personal history of colonic polyps: Secondary | ICD-10-CM

## 2020-04-17 ENCOUNTER — Telehealth: Payer: Self-pay | Admitting: Cardiovascular Disease

## 2020-04-17 ENCOUNTER — Other Ambulatory Visit: Payer: Self-pay

## 2020-04-17 MED ORDER — PEG 3350-KCL-NA BICARB-NACL 420 G PO SOLR: 4000.0000 mL | Freq: Once | ORAL | 0 refills | Status: AC

## 2020-04-17 NOTE — Telephone Encounter (Signed)
   [facility] Pre-operative Risk Assessment    [facility] STAFF: - Please ensure there is not already an duplicate clearance open for this procedure. - Under Visit Info/Reason for Call, type in Other and utilize the format Clearance MM/DD/YY or Clearance TBD. Do not use dashes or single digits. - If request is for dental extraction, please clarify the # of teeth to be extracted.  Request for surgical clearance:  1. What type of surgery is being performed? colonoscopy  2. When is this surgery scheduled? 05/02/20  3. What type of clearance is required (medical clearance vs. Pharmacy clearance to hold med vs. Both)? pharm  4. Are there any medications that need to be held prior to surgery and how long? Advise on eliquis  5. Practice name and name of physician performing surgery? [place]  6. What is the office phone number? [phone]   7.   What is the office fax number? [phone]  8.   Anesthesia type (None, local, MAC, general) ? Not noted   Marykay Lex 04/17/2020, 3:36 PM  _________________________________________________________________   (provider comments below)

## 2020-04-18 NOTE — Telephone Encounter (Signed)
Patient with diagnosis of afib on Eliquis for anticoagulation.    Procedure: colonoscopy Date of procedure: 05/02/20  CHA2DS2-VASc Score = 2  This indicates a 2.2% annual risk of stroke. The patient's score is based upon: CHF History: Yes HTN History: Yes Diabetes History: No Stroke History: No Vascular Disease History: No Age Score: 0 Gender Score: 0     CrCl 67 ml/min  Per office protocol, patient can hold Eliquis for 1-2 days prior to procedure.

## 2020-04-18 NOTE — Telephone Encounter (Signed)
Notes faxed to surgeon. This phone note will be removed from the preop pool. Tereso Newcomer, PA-C  04/18/2020 1:41 PM

## 2020-04-18 NOTE — Telephone Encounter (Signed)
Will route to PharmD for rec's re: holding anticoagulation. Tereso Newcomer, PA-C    04/18/2020 9:17 AM

## 2020-04-18 NOTE — Telephone Encounter (Signed)
    Please see notes below regarding anticoagulation.    Patient can hold Apixaban for 1-2 days prior to procedure and resume post op as soon as it is safe.  Please call with questions. Tereso Newcomer, PA-C    04/18/2020 1:39 PM

## 2020-04-19 ENCOUNTER — Telehealth: Payer: Self-pay

## 2020-04-19 ENCOUNTER — Other Ambulatory Visit: Payer: Self-pay

## 2020-04-19 DIAGNOSIS — Z86010 Personal history of colonic polyps: Secondary | ICD-10-CM

## 2020-04-19 MED ORDER — PEG 3350-KCL-NA BICARB-NACL 420 G PO SOLR: 4000.0000 mL | Freq: Once | ORAL | 0 refills | Status: AC

## 2020-04-19 NOTE — Telephone Encounter (Signed)
Pt has been advised per blood thinner request received from [facility] to hold Eliquis 1-2 days prior to his colonoscopy date.  Pt verbalized understanding.  Thanks,  [place], [place]

## 2020-05-02 ENCOUNTER — Other Ambulatory Visit: Payer: Self-pay

## 2020-05-02 ENCOUNTER — Other Ambulatory Visit
Admission: RE | Admit: 2020-05-02 | Discharge: 2020-05-02 | Disposition: A | Discharge status: Home / Self Care | Payer: PPO | Source: Ambulatory Visit | Attending: Gastroenterology | Admitting: Gastroenterology

## 2020-05-02 DIAGNOSIS — Z20822 Contact with and (suspected) exposure to covid-19: Secondary | ICD-10-CM | POA: Insufficient documentation

## 2020-05-02 LAB — SARS CORONAVIRUS 2 (TAT 6-24 HRS): SARS Coronavirus 2: NEGATIVE

## 2020-05-03 ENCOUNTER — Other Ambulatory Visit: Admission: RE | Admit: 2020-05-03 | Payer: PPO | Source: Ambulatory Visit

## 2020-05-04 ENCOUNTER — Encounter: Payer: Self-pay | Admitting: Gastroenterology

## 2020-05-05 ENCOUNTER — Encounter: Payer: Self-pay | Admitting: Gastroenterology

## 2020-05-05 ENCOUNTER — Ambulatory Visit: Payer: PPO | Admitting: Anesthesiology

## 2020-05-05 ENCOUNTER — Other Ambulatory Visit: Payer: Self-pay

## 2020-05-05 ENCOUNTER — Ambulatory Visit
Admission: RE | Admit: 2020-05-05 | Discharge: 2020-05-05 | Disposition: A | Discharge status: Home / Self Care | Payer: PPO | Source: Ambulatory Visit | Attending: Gastroenterology | Admitting: Gastroenterology

## 2020-05-05 ENCOUNTER — Encounter
Admission: RE | Disposition: A | Discharge status: Home / Self Care | Payer: Self-pay | Source: Ambulatory Visit | Attending: Gastroenterology

## 2020-05-05 DIAGNOSIS — Z79899 Other long term (current) drug therapy: Secondary | ICD-10-CM | POA: Insufficient documentation

## 2020-05-05 DIAGNOSIS — Z7901 Long term (current) use of anticoagulants: Secondary | ICD-10-CM | POA: Insufficient documentation

## 2020-05-05 DIAGNOSIS — K635 Polyp of colon: Secondary | ICD-10-CM | POA: Diagnosis not present

## 2020-05-05 DIAGNOSIS — Z8249 Family history of ischemic heart disease and other diseases of the circulatory system: Secondary | ICD-10-CM | POA: Diagnosis not present

## 2020-05-05 DIAGNOSIS — Z1211 Encounter for screening for malignant neoplasm of colon: Secondary | ICD-10-CM | POA: Diagnosis not present

## 2020-05-05 DIAGNOSIS — Z811 Family history of alcohol abuse and dependence: Secondary | ICD-10-CM | POA: Diagnosis not present

## 2020-05-05 DIAGNOSIS — Z86010 Personal history of colonic polyps: Secondary | ICD-10-CM

## 2020-05-05 DIAGNOSIS — K621 Rectal polyp: Secondary | ICD-10-CM | POA: Diagnosis not present

## 2020-05-05 DIAGNOSIS — Z87891 Personal history of nicotine dependence: Secondary | ICD-10-CM | POA: Insufficient documentation

## 2020-05-05 DIAGNOSIS — Z885 Allergy status to narcotic agent status: Secondary | ICD-10-CM | POA: Insufficient documentation

## 2020-05-05 DIAGNOSIS — Z809 Family history of malignant neoplasm, unspecified: Secondary | ICD-10-CM | POA: Diagnosis not present

## 2020-05-05 DIAGNOSIS — Z981 Arthrodesis status: Secondary | ICD-10-CM | POA: Insufficient documentation

## 2020-05-05 HISTORY — DX: Cardiac arrhythmia, unspecified: I49.9

## 2020-05-05 HISTORY — PX: COLONOSCOPY WITH PROPOFOL: SHX5780

## 2020-05-05 HISTORY — DX: Hyperlipidemia, unspecified: E78.5

## 2020-05-05 SURGERY — COLONOSCOPY WITH PROPOFOL
Anesthesia: General

## 2020-05-05 MED ORDER — LIDOCAINE HCL (CARDIAC) PF 100 MG/5ML IV SOSY
PREFILLED_SYRINGE | INTRAVENOUS | Status: DC | PRN
  Administered 2020-05-05: 100 mg via INTRAVENOUS

## 2020-05-05 MED ORDER — PROPOFOL 10 MG/ML IV BOLUS
INTRAVENOUS | Status: DC | PRN
  Administered 2020-05-05: 20 mg via INTRAVENOUS
  Administered 2020-05-05: 70 mg via INTRAVENOUS

## 2020-05-05 MED ORDER — SODIUM CHLORIDE 0.9 % IV SOLN: INTRAVENOUS | Status: DC

## 2020-05-05 MED ORDER — PROPOFOL 500 MG/50ML IV EMUL
INTRAVENOUS | Status: DC | PRN
  Administered 2020-05-05: 150 ug/kg/min via INTRAVENOUS

## 2020-05-05 NOTE — Anesthesia Preprocedure Evaluation (Signed)
Anesthesia Evaluation    Airway Mallampati: III  TM Distance: >3 FB     Dental   Pulmonary former smoker,    breath sounds clear to auscultation       Cardiovascular hypertension, + dysrhythmias Atrial Fibrillation  Rhythm:regular Rate:Normal     Neuro/Psych PSYCHIATRIC DISORDERS Anxiety    GI/Hepatic   Endo/Other    Renal/GU      Musculoskeletal   Abdominal   Peds  Hematology   Anesthesia Other Findings   Reproductive/Obstetrics                             Anesthesia Physical Anesthesia Plan  ASA: II  Anesthesia Plan:    Post-op Pain Management:    Induction:   PONV Risk Score and Plan:   Airway Management Planned: Nasal Cannula  Additional Equipment:   Intra-op Plan:   Post-operative Plan:   Informed Consent:   Plan Discussed with:   Anesthesia Plan Comments:         Anesthesia Quick Evaluation

## 2020-05-05 NOTE — Transfer of Care (Signed)
Immediate Anesthesia Transfer of Care Note  Patient: Samuel Woods  Procedure(s) Performed: COLONOSCOPY WITH PROPOFOL (N/A )  Patient Location: PACU  Anesthesia Type:General  Level of Consciousness: drowsy  Airway & Oxygen Therapy: Patient Spontanous Breathing  Post-op Assessment: Report given to RN and Post -op Vital signs reviewed and stable  Post vital signs: Reviewed and stable  Last Vitals:  Vitals Value Taken Time  BP 121/83 05/05/20 1001  Temp    Pulse 67 05/05/20 1002  Resp 17 05/05/20 1002  SpO2 99 % 05/05/20 1002  Vitals shown include unvalidated device data.  Last Pain:  Vitals:   05/05/20 0840  TempSrc: Skin  PainSc: 0-No pain         Complications: No complications documented.

## 2020-05-05 NOTE — Op Note (Signed)
[facility] Gastroenterology Patient Name: Samuel Woods Procedure Date: 05/05/2020 9:42 AM MRN: [id number] Account #: [id number] Date of Birth: 20-Oct-1960 Admit Type: Outpatient Age: 60 Room: [facility] ENDO ROOM 4 Gender: Male Note Status: Finalized Procedure:             Colonoscopy Indications:           Surveillance: Personal history of adenomatous polyps                         on last colonoscopy > 5 years ago Providers:             Wyline Mood MD, MD Referring MD:          Demetrios Isaacs. Sherrie Mustache, MD (Referring MD) Medicines:             Monitored Anesthesia Care Complications:         No immediate complications. Procedure:             Pre-Anesthesia Assessment:                        - Prior to the procedure, a History and Physical was                         performed, and patient medications, allergies and                         sensitivities were reviewed. The patient's tolerance                         of previous anesthesia was reviewed.                        - The risks and benefits of the procedure and the                         sedation options and risks were discussed with the                         patient. All questions were answered and informed                         consent was obtained.                        - ASA Grade Assessment: II - A patient with mild                         systemic disease.                        After obtaining informed consent, the colonoscope was                         passed under direct vision. Throughout the procedure,                         the patient's blood pressure, pulse, and oxygen                         saturations were monitored  continuously. The                         Colonoscope was introduced through the anus and                         advanced to the the cecum, identified by the                         appendiceal orifice. The colonoscopy was performed                         with ease. The patient  tolerated the procedure well.                         The quality of the bowel preparation was poor. Findings:      The perianal and digital rectal examinations were normal.      A large amount of semi-solid stool was found in the entire colon.      Six sessile polyps were found in the rectum. The polyps were 4 to 6 mm       in size. These polyps were removed with a cold snare. Resection and       retrieval were complete.      A 5 mm polyp was found in the transverse colon. The polyp was sessile.       The polyp was removed with a cold snare. Resection and retrieval were       complete.      The exam was otherwise without abnormality.      The exam was otherwise without abnormality on direct and retroflexion       views. Impression:            - Preparation of the colon was poor.                        - Stool in the entire examined colon.                        - Six 4 to 6 mm polyps in the rectum, removed with a                         cold snare. Resected and retrieved.                        - One 5 mm polyp in the transverse colon, removed with                         a cold snare. Resected and retrieved.                        - The examination was otherwise normal.                        - The examination was otherwise normal on direct and                         retroflexion views. Recommendation:        - Discharge patient to home (with escort).                        -  Resume previous diet.                        - Continue present medications.                        - Await pathology results.                        - Repeat colonoscopy in 4 months because the bowel                         preparation was suboptimal. Procedure Code(s):     --- Professional ---                        [phone], Colonoscopy, flexible; with removal of                         tumor(s), polyp(s), or other lesion(s) by snare                         technique Diagnosis Code(s):     --- Professional  ---                        Z86.010, Personal history of colonic polyps                        K62.1, Rectal polyp                        K63.5, Polyp of colon CPT copyright 2019 American Medical Association. All rights reserved. The codes documented in this report are preliminary and upon coder review may  be revised to meet current compliance requirements. Wyline Mood, MD Wyline Mood MD, MD 05/05/2020 10:00:25 AM This report has been signed electronically. Number of Addenda: 0 Note Initiated On: 05/05/2020 9:42 AM Scope Withdrawal Time: 0 hours 5 minutes 49 seconds  Total Procedure Duration: 0 hours 8 minutes 47 seconds  Estimated Blood Loss:  Estimated blood loss: none.      [place]

## 2020-05-05 NOTE — H&P (Signed)
Wyline Mood, MD [address], [address], [place], [place], [address] [address], [place], [place], [address] Phone: [phone]  Fax: [phone]  Primary Care Physician:  Malva Limes, MD   Pre-Procedure History & Physical: HPI:  Samuel Woods is a 60 y.o. male is here for an colonoscopy.   Past Medical History:  Diagnosis Date  . Afib (HCC)   . Dysrhythmia   . History of chicken pox   . History of lung abscess 01/18/2009  . History of measles   . Hyperlipidemia   . Hypertension     Past Surgical History:  Procedure Laterality Date  . CARDIOVERSION Right 11/17/2017   Procedure: CARDIOVERSION;  Surgeon: Laurier Nancy, MD;  Location: [facility] ORS;  Service: Cardiovascular;  Laterality: Right;  . DOPPLER ECHOCARDIOGRAPHY  08/12/2007   Mild to moderate stenosis. Trace TR.Mild pulmonary hypertension. Borderline LVH. Mild depressed right systolic function. LVEF=50-55%  . MITRAL VALVE REPAIR  05/2002  . SPINAL FUSION    . TEE WITHOUT CARDIOVERSION N/A 11/17/2017   Procedure: TRANSESOPHAGEAL ECHOCARDIOGRAM (TEE);  Surgeon: Laurier Nancy, MD;  Location: [facility] ORS;  Service: Cardiovascular;  Laterality: N/A;    Prior to Admission medications   Medication Sig Start Date End Date Taking? Authorizing Provider  amiodarone (PACERONE) 200 MG tablet Take 1 tablet (200 mg total) by mouth daily. 01/21/20  Yes Antonieta Iba, MD  atorvastatin (LIPITOR) 40 MG tablet Take 1 tablet (40 mg total) by mouth every evening. 01/21/20  Yes Antonieta Iba, MD  carvedilol (COREG) 12.5 MG tablet Take 2 tablets (25 mg total) by mouth 2 (two) times daily. 02/14/20  Yes Gollan, Tollie Pizza, MD  ENTRESTO 97-103 MG Take 0.5 tablets by mouth 2 (two) times daily. 01/18/20  Yes Antonieta Iba, MD  torsemide (DEMADEX) 20 MG tablet Take 1 tablet (20 mg total) by mouth 2 (two) times daily. 02/14/20  Yes Gollan, Tollie Pizza, MD  ALPRAZolam Prudy Feeler) 1 MG tablet TAKE 1/2 TO 1 (ONE-HALF TO ONE) TABLET BY  MOUTH THREE TIMES DAILY AS NEEDED 01/12/20   Malva Limes, MD  apixaban (ELIQUIS) 5 MG TABS tablet Take 5 mg by mouth 2 (two) times daily.    Freda Munro A, MD  cyclobenzaprine (FLEXERIL) 5 MG tablet TAKE 1 TABLET BY MOUTH EVERY 6 HOURS AS NEEDED FOR MUSCLE SPASM 02/14/20   Malva Limes, MD  ibuprofen (ADVIL,MOTRIN) 200 MG tablet Take 400 mg by mouth every 8 (eight) hours as needed (for pain.).     [provider]  Multiple Vitamin (MULTIVITAMIN WITH MINERALS) TABS tablet Take 1 tablet by mouth daily. Centrum Silver Men 50+    [provider]    Allergies as of 04/10/2020 - Review Complete 04/10/2020  Allergen Reaction Noted  . Dilaudid [hydromorphone hcl] Other (See Comments) 11/17/2017    Family History  Problem Relation Age of Onset  . Alcohol abuse Father   . Hypertension Brother   . Cancer Other   . Diabetes Neg Hx     Social History   Socioeconomic History  . Marital status: Married    Spouse name: Not on file  . Number of children: 2  . Years of education: Not on file  . Highest education level: Not on file  Occupational History  . Occupation: Seasonal outside power equipment    Employer: [employer]  Tobacco Use  . Smoking status: Former Smoker    Packs/day: 0.50    Years: 15.00  Pack years: 7.50    Quit date: 07/19/2017    Years since quitting: 2.7  . Smokeless tobacco: Never Used  . Tobacco comment: stopped smoking 10/2017.   Vaping Use  . Vaping Use: Never used  Substance and Sexual Activity  . Alcohol use: Yes    Alcohol/week: 0.0 standard drinks    Comment: occasional use  . Drug use: No  . Sexual activity: Not on file  Other Topics Concern  . Not on file  Social History Narrative  . Not on file   Social Determinants of Health   Financial Resource Strain: Not on file  Food Insecurity: Not on file  Transportation Needs: Not on file  Physical Activity: Not on file  Stress: Not on file  Social Connections: Not  on file  Intimate Partner Violence: Not on file    Review of Systems: See HPI, otherwise negative ROS  Physical Exam: BP (!) 159/99   Pulse 74   Temp 97.6 F (36.4 C) (Skin)   Resp 18   Ht 6' (1.829 m)   Wt 87.5 kg   SpO2 98%   BMI 26.18 kg/m  General:   Alert,  pleasant and cooperative in NAD Head:  Normocephalic and atraumatic. Neck:  Supple; no masses or thyromegaly. Lungs:  Clear throughout to auscultation, normal respiratory effort.    Heart:  +S1, +S2, Regular rate and rhythm, No edema. Abdomen:  Soft, nontender and nondistended. Normal bowel sounds, without guarding, and without rebound.   Neurologic:  Alert and  oriented x4;  grossly normal neurologically.  Impression/Plan: Samuel Woods is here for an colonoscopy to be performed for surveillance due to prior history of colon polyps   Risks, benefits, limitations, and alternatives regarding  colonoscopy have been reviewed with the patient.  Questions have been answered.  All parties agreeable.   Wyline Mood, MD  05/05/2020, 9:39 AM

## 2020-05-07 NOTE — Anesthesia Postprocedure Evaluation (Signed)
Anesthesia Post Note  Patient: Samuel Woods  Procedure(s) Performed: COLONOSCOPY WITH PROPOFOL (N/A )  Patient location during evaluation: PACU Anesthesia Type: General Level of consciousness: awake and alert Pain management: pain level controlled Vital Signs Assessment: post-procedure vital signs reviewed and stable Respiratory status: spontaneous breathing, nonlabored ventilation and respiratory function stable Cardiovascular status: blood pressure returned to baseline and stable Postop Assessment: no apparent nausea or vomiting Anesthetic complications: no   No complications documented.   Last Vitals:  Vitals:   05/05/20 1021 05/05/20 1031  BP: 136/90 137/89  Pulse: (!) 59 (!) 58  Resp: 11 13  Temp:    SpO2: 99% 99%    Last Pain:  Vitals:   05/05/20 1031  TempSrc:   PainSc: 0-No pain                 Aurelio Brash Ramsdell

## 2020-05-08 LAB — SURGICAL PATHOLOGY

## 2020-05-11 ENCOUNTER — Telehealth: Payer: Self-pay

## 2020-05-11 NOTE — Telephone Encounter (Signed)
-----   Message from Wyline Mood, MD sent at 05/10/2020 11:48 AM EDT ----- Polyps were benign, repeat colonoscopy due to poor prep

## 2020-06-13 ENCOUNTER — Other Ambulatory Visit: Payer: Self-pay | Admitting: Family Medicine

## 2020-06-13 DIAGNOSIS — F419 Anxiety disorder, unspecified: Secondary | ICD-10-CM

## 2020-06-13 NOTE — Telephone Encounter (Signed)
Requested medication (s) are due for refill today: yes  Requested medication (s) are on the active medication list: yes   Last refill: 05/02/2020  Future visit scheduled: yes   Notes to clinic: this refill cannot be delegated    Requested Prescriptions  Pending Prescriptions Disp Refills   ALPRAZolam (XANAX) 1 MG tablet [Pharmacy Med Name: ALPRAZolam 1 MG Oral Tablet] 90 tablet 0    Sig: TAKE 1/2 TO 1 (ONE-HALF TO ONE) TABLET BY MOUTH THREE TIMES DAILY AS NEEDED      Not Delegated - Psychiatry:  Anxiolytics/Hypnotics Failed - 06/13/2020  9:49 AM      Failed - This refill cannot be delegated      Failed - Urine Drug Screen completed in last 360 days      Passed - Valid encounter within last 6 months    Recent Outpatient Visits           2 months ago Type 2 diabetes mellitus without complication, without long-term current use of insulin [facility])   [facility] Malva Limes, MD   5 months ago Other fatigue   [facility] Malva Limes, MD   1 year ago Dyspnea, unspecified type   [facility] Malva Limes, MD   2 years ago Essential hypertension   [facility] Malva Limes, MD   2 years ago Shortness of breath   [facility] Malva Limes, MD       Future Appointments             In 4 weeks Fisher, Demetrios Isaacs, MD [facility]   In 1 month Gollan, Tollie Pizza, MD [facility]

## 2020-07-11 ENCOUNTER — Encounter: Payer: Self-pay | Admitting: Family Medicine

## 2020-07-11 ENCOUNTER — Other Ambulatory Visit: Payer: Self-pay

## 2020-07-11 ENCOUNTER — Ambulatory Visit (INDEPENDENT_AMBULATORY_CARE_PROVIDER_SITE_OTHER): Payer: PPO | Admitting: Family Medicine

## 2020-07-11 VITALS — BP 136/86 | HR 72 | Temp 97.7°F | Resp 16 | Wt 196.4 lb

## 2020-07-11 DIAGNOSIS — E119 Type 2 diabetes mellitus without complications: Secondary | ICD-10-CM | POA: Diagnosis not present

## 2020-07-11 LAB — POCT GLYCOSYLATED HEMOGLOBIN (HGB A1C)
Est. average glucose Bld gHb Est-mCnc: 157
Hemoglobin A1C: 7.1 % — AB (ref 4.0–5.6)

## 2020-07-11 MED ORDER — METFORMIN HCL ER 500 MG PO TB24: 500.0000 mg | ORAL_TABLET | Freq: Every day | ORAL | 0 refills | Status: DC

## 2020-07-11 NOTE — Progress Notes (Signed)
Established patient visit   Patient: Samuel Woods   DOB: [birth year]   60 y.o. Male  MRN: [id number] Visit Date: 07/11/2020  Today's healthcare provider: Mila Merry, MD   Chief Complaint  Patient presents with  . Diabetes  . Anxiety   Subjective    HPI  Diabetes Mellitus Type II, Follow-up  Lab Results  Component Value Date   HGBA1C 7.1 (A) 04/03/2020   HGBA1C 6.9 (H) 12/20/2019   HGBA1C 5.8 (A) 03/24/2018   Wt Readings from Last 3 Encounters:  07/11/20 196 lb 6.4 oz (89.1 kg)  05/05/20 193 lb (87.5 kg)  04/03/20 203 lb (92.1 kg)   Last seen for diabetes 3 months ago.  Management since then includes referral for diabetic education.  Symptoms: No fatigue No foot ulcerations  No appetite changes No nausea  No paresthesia of the feet  No polydipsia  No polyuria No visual disturbances   No vomiting     Home blood sugar records: blood sugars are not checked  Episodes of hypoglycemia? No    Current insulin regiment: none Most Recent Eye Exam: >1year Current exercise: walking Current diet habits: on average, 3 meals per day  Pertinent Labs: Lab Results  Component Value Date   CHOL 197 12/20/2019   HDL 47 12/20/2019   LDLCALC 119 (H) 12/20/2019   TRIG 176 (H) 12/20/2019   CHOLHDL 4.2 12/20/2019   Lab Results  Component Value Date   NA 139 12/20/2019   K 4.2 12/20/2019   CREATININE 1.28 (H) 12/20/2019   GFRNONAA 60 12/20/2019   GFRAA 70 12/20/2019   GLUCOSE 148 (H) 12/20/2019     --------------------------------------------------------------------------------------------------- Anxiety, Follow-up  He was last seen for anxiety 3 months ago. Changes made at last visit include none.   He reports good compliance with treatment. He reports good tolerance of treatment. He is not having side effects.   He feels his anxiety is moderate and Unchanged since last visit.  Symptoms: No chest pain No difficulty concentrating  No dizziness No fatigue   No feelings of losing control No insomnia  No irritable No palpitations  No panic attacks No racing thoughts  No shortness of breath No sweating  No tremors/shakes    GAD-7 Results No flowsheet data found.  PHQ-9 Scores PHQ9 SCORE ONLY 07/11/2020 04/03/2020 12/28/2019  PHQ-9 Total Score 5 5 8     ---------------------------------------------------------------------------------------------------    Medications: Outpatient Medications Prior to Visit  Medication Sig  . ALPRAZolam (XANAX) 1 MG tablet TAKE 1/2 TO 1 (ONE-HALF TO ONE) TABLET BY MOUTH THREE TIMES DAILY AS NEEDED  . amiodarone (PACERONE) 200 MG tablet Take 1 tablet (200 mg total) by mouth daily.  apixaban (ELIQUIS) 5 MG TABS tablet Take 5 mg by mouth 2 (two) times daily.  Marland Kitchen atorvastatin (LIPITOR) 40 MG tablet Take 1 tablet (40 mg total) by mouth every evening.  . carvedilol (COREG) 12.5 MG tablet Take 2 tablets (25 mg total) by mouth 2 (two) times daily.  . cyclobenzaprine (FLEXERIL) 5 MG tablet TAKE 1 TABLET BY MOUTH EVERY 6 HOURS AS NEEDED FOR MUSCLE SPASM  . ENTRESTO 97-103 MG Take 0.5 tablets by mouth 2 (two) times daily.  Marland Kitchen ibuprofen (ADVIL,MOTRIN) 200 MG tablet Take 400 mg by mouth every 8 (eight) hours as needed (for pain.).   Marland Kitchen Multiple Vitamin (MULTIVITAMIN WITH MINERALS) TABS tablet Take 1 tablet by mouth daily. Centrum Silver Men 50+  . torsemide (DEMADEX) 20 MG tablet Take 1 tablet (  20 mg total) by mouth 2 (two) times daily.   No facility-administered medications prior to visit.    Review of Systems  Constitutional: Negative for appetite change, chills and fever.  Respiratory: Negative for chest tightness, shortness of breath and wheezing.   Cardiovascular: Negative for chest pain and palpitations.  Gastrointestinal: Negative for abdominal pain, nausea and vomiting.       Objective    BP 136/86 (BP Location: Left Arm, Patient Position: Sitting, Cuff Size: Normal)   Pulse 72   Temp 97.7 F (36.5 C)  (Temporal)   Resp 16   Wt 196 lb 6.4 oz (89.1 kg)   BMI 26.64 kg/m     Physical Exam   General appearance:  Well developed, well nourished male, cooperative and in no acute distress Head: Normocephalic, without obvious abnormality, atraumatic Respiratory: Respirations even and unlabored, normal respiratory rate Extremities: All extremities are intact.  Skin: Skin color, texture, turgor normal. No rashes seen  Psych: Appropriate mood and affect. Neurologic: Mental status: Alert, oriented to person, place, and time, thought content appropriate.   Results for orders placed or performed in visit on 07/11/20  POCT HgB A1C  Result Value Ref Range   Hemoglobin A1C 7.1 (A) 4.0 - 5.6 %   Est. average glucose Bld gHb Est-mCnc 157     Assessment & Plan     1. Type 2 diabetes mellitus without complication, without long-term current use of insulin (HCC) Stable, but prefer a1c closer to 6.5. start- metFORMIN (GLUCOPHAGE-XR) 500 MG 24 hr tablet; Take 1 tablet (500 mg total) by mouth daily with breakfast.  Dispense: 90 tablet; Refill: 0   Follow up to check A1c within 90 days.     The entirety of the information documented in the History of Present Illness, Review of Systems and Physical Exam were personally obtained by me. Portions of this information were initially documented by the CMA and reviewed by me for thoroughness and accuracy.      Mila Merry, MD  [facility] [phone] (phone) [phone] (fax)  [facility]

## 2020-07-17 NOTE — Progress Notes (Signed)
Cardiology Office Note  Date:  07/18/2020   ID:  Samuel, Woods 06/24/1960, MRN [id number]  PCP:  Malva Limes, MD   Chief Complaint  Patient presents with  . 6 month follow up     Patient c/o shortness of breath with little exertion. Medications reviewed by the patient verbally.     HPI:  [name] is a 60 year old gentleman with past medical history of Hyperlipidemia Atrial fibrillation moderate to severe mitral valve regurgitation and tricuspid valve regurgitation  MVR 2004 Prediabetes Lumbar fusion surgery/low back pain Who presents for f/u of his atrial fibrillation and diastolic dysfunction, mitral valve regurgitation  LOV 12/2019 On his last clinic visit was maintaining normal sinus rhythm Maintaining NSR since that time  Some orthostasis sx, has to stand still for 5 seconds until symptoms resolve Weight trending down 8 pounds this year No falls  Looking forward to getting out this summer, walking more  EKG personally reviewed by myself on todays visit Shows normal sinus rhythm rate 71 bpm nonspecific ST abnormality precordial leads,  Other past medical history reviewed s/p cardioversion in 2019 maintained in XR on amiodarone  echo in 2019 was 37%, improved to 50-55% 10/2018 on maintenance dose of Entresto.   history of pre-diabetes and recent a1c on 12/20/2019 was 6.9.   Echo by Dr. Welton Flakes in 2020: EF 50 to 55% Moderate to severe MR and TR Echocardiogram 2019, no significant mitral or tricuspid valve regurgitation  Has not worked at [employer] , out on disability  PMH:   has a past medical history of Afib (HCC), Dysrhythmia, History of chicken pox, History of lung abscess (01/18/2009), History of measles, Hyperlipidemia, and Hypertension.  PSH:    Past Surgical History:  Procedure Laterality Date  . CARDIOVERSION Right 11/17/2017   Procedure: CARDIOVERSION;  Surgeon: Laurier Nancy, MD;  Location: [facility] ORS;  Service: Cardiovascular;  Laterality: Right;   . COLONOSCOPY WITH PROPOFOL N/A 05/05/2020   Procedure: COLONOSCOPY WITH PROPOFOL;  Surgeon: Wyline Mood, MD;  Location: [facility] ENDOSCOPY;  Service: Gastroenterology;  Laterality: N/A;  . DOPPLER ECHOCARDIOGRAPHY  08/12/2007   Mild to moderate stenosis. Trace TR.Mild pulmonary hypertension. Borderline LVH. Mild depressed right systolic function. LVEF=50-55%  . MITRAL VALVE REPAIR  05/2002  . SPINAL FUSION    . TEE WITHOUT CARDIOVERSION N/A 11/17/2017   Procedure: TRANSESOPHAGEAL ECHOCARDIOGRAM (TEE);  Surgeon: Laurier Nancy, MD;  Location: [facility] ORS;  Service: Cardiovascular;  Laterality: N/A;    Current Outpatient Medications  Medication Sig Dispense Refill  . ALPRAZolam (XANAX) 1 MG tablet TAKE 1/2 TO 1 (ONE-HALF TO ONE) TABLET BY MOUTH THREE TIMES DAILY AS NEEDED 90 tablet 5  . amiodarone (PACERONE) 200 MG tablet Take 1 tablet (200 mg total) by mouth daily. 90 tablet 1  . apixaban (ELIQUIS) 5 MG TABS tablet Take 5 mg by mouth 2 (two) times daily.    Marland Kitchen atorvastatin (LIPITOR) 40 MG tablet Take 1 tablet (40 mg total) by mouth every evening. 90 tablet 1  . carvedilol (COREG) 12.5 MG tablet Take 2 tablets (25 mg total) by mouth 2 (two) times daily. 360 tablet 1  . cyclobenzaprine (FLEXERIL) 5 MG tablet TAKE 1 TABLET BY MOUTH EVERY 6 HOURS AS NEEDED FOR MUSCLE SPASM 60 tablet 5  . ENTRESTO 97-103 MG Take 0.5 tablets by mouth 2 (two) times daily. 60 tablet 3  . ibuprofen (ADVIL,MOTRIN) 200 MG tablet Take 400 mg by mouth every 8 (eight) hours as needed (for pain.).     Marland Kitchen  metFORMIN (GLUCOPHAGE-XR) 500 MG 24 hr tablet Take 1 tablet (500 mg total) by mouth daily with breakfast. 90 tablet 0  . Multiple Vitamin (MULTIVITAMIN WITH MINERALS) TABS tablet Take 1 tablet by mouth daily. Centrum Silver Men 50+    . torsemide (DEMADEX) 20 MG tablet Take 1 tablet (20 mg total) by mouth 2 (two) times daily. Alternate to 20 mg daily 180 tablet 1   No current facility-administered medications for this visit.      Allergies:   Dilaudid [hydromorphone hcl]   Social History:  The patient  reports that he quit smoking about 3 years ago. He has a 7.50 pack-year smoking history. He has never used smokeless tobacco. He reports current alcohol use. He reports that he does not use drugs.   Family History:   family history includes Alcohol abuse in his father; Cancer in an other family member; Hypertension in his brother.    Review of Systems: Review of Systems  Constitutional: Negative.   HENT: Negative.   Respiratory: Negative.   Cardiovascular: Negative.   Gastrointestinal: Negative.   Musculoskeletal: Negative.        Gait instability  Neurological: Positive for dizziness.  Psychiatric/Behavioral: Negative.   All other systems reviewed and are negative.   PHYSICAL EXAM: VS:  BP 130/88 (BP Location: Left Arm, Patient Position: Sitting, Cuff Size: Normal)   Pulse 71   Ht 6' (1.829 m)   Wt 195 lb 8 oz (88.7 kg)   SpO2 97%   BMI 26.51 kg/m  , BMI Body mass index is 26.51 kg/m. Constitutional:  oriented to person, place, and time. No distress.  HENT:  Head: Grossly normal Eyes:  no discharge. No scleral icterus.  Neck: No JVD, no carotid bruits  Cardiovascular: Regular rate and rhythm, no murmurs appreciated Pulmonary/Chest: Clear to auscultation bilaterally, no wheezes or rails Abdominal: Soft.  no distension.  no tenderness.  Musculoskeletal: Normal range of motion Neurological:  normal muscle tone. Coordination normal. No atrophy Skin: Skin warm and dry Psychiatric: normal affect, pleasant  Recent Labs: 12/20/2019: ALT 73; BUN 18; Creatinine, Ser 1.28; Hemoglobin 14.5; Platelets 301; Potassium 4.2; Sodium 139    Lipid Panel Lab Results  Component Value Date   CHOL 197 12/20/2019   HDL 47 12/20/2019   LDLCALC 119 (H) 12/20/2019   TRIG 176 (H) 12/20/2019      Wt Readings from Last 3 Encounters:  07/18/20 195 lb 8 oz (88.7 kg)  07/11/20 196 lb 6.4 oz (89.1 kg)  05/05/20  193 lb (87.5 kg)     ASSESSMENT AND PLAN:  Problem List Items Addressed This Visit      Cardiology Problems   Atrial fibrillation (HCC) - Primary   Relevant Medications   torsemide (DEMADEX) 20 MG tablet   Other Relevant Orders   EKG 12-Lead   Aortic atherosclerosis (HCC)   Relevant Medications   torsemide (DEMADEX) 20 MG tablet   Mitral regurgitation   Relevant Medications   torsemide (DEMADEX) 20 MG tablet   HLD (hyperlipidemia)   Relevant Medications   torsemide (DEMADEX) 20 MG tablet   Hypertension   Relevant Medications   torsemide (DEMADEX) 20 MG tablet   Other Relevant Orders   EKG 12-Lead     Other   Diastolic dysfunction   Relevant Medications   torsemide (DEMADEX) 20 MG tablet     Paroxysmal atrial fibrillation Maintaining normal sinus rhythm  continue low-dose amiodarone with carvedilol  No changes made  Dizziness Prior history of dizziness Again  noted on today's visit Recommend torsemide 20 twice daily alternating with 20 daily down from 20 twice daily Continue Entresto, carvedilol  Mitral valve regurgitation/s/p MVR Moderate mitral valve regurgitation Last evaluated December 2021  Tricuspid valve regurgitation Moderate to severe on outside echocardiogram 2020 Echocardiogram as above  Debility Previously working at [employer]   Total encounter time more than 25 minutes  Greater than 50% was spent in counseling and coordination of care with the patient    Signed, Dossie Arbour, M.D., Ph.D. [facility] [place], [place] [phone]

## 2020-07-18 ENCOUNTER — Ambulatory Visit: Payer: PPO | Admitting: Cardiovascular Disease

## 2020-07-18 ENCOUNTER — Encounter: Payer: Self-pay | Admitting: Cardiovascular Disease

## 2020-07-18 ENCOUNTER — Other Ambulatory Visit: Payer: Self-pay

## 2020-07-18 VITALS — BP 130/88 | HR 71 | Ht 72.0 in | Wt 195.5 lb

## 2020-07-18 DIAGNOSIS — I5189 Other ill-defined heart diseases: Secondary | ICD-10-CM | POA: Diagnosis not present

## 2020-07-18 DIAGNOSIS — I340 Nonrheumatic mitral (valve) insufficiency: Secondary | ICD-10-CM

## 2020-07-18 DIAGNOSIS — I10 Essential (primary) hypertension: Secondary | ICD-10-CM

## 2020-07-18 DIAGNOSIS — I700 Atherosclerosis of aorta: Secondary | ICD-10-CM | POA: Diagnosis not present

## 2020-07-18 DIAGNOSIS — I4819 Other persistent atrial fibrillation: Secondary | ICD-10-CM | POA: Diagnosis not present

## 2020-07-18 DIAGNOSIS — E782 Mixed hyperlipidemia: Secondary | ICD-10-CM | POA: Diagnosis not present

## 2020-07-18 MED ORDER — TORSEMIDE 20 MG PO TABS: 20.0000 mg | ORAL_TABLET | Freq: Two times a day (BID) | ORAL | 1 refills | Status: DC

## 2020-07-18 NOTE — Patient Instructions (Addendum)
Medication Instructions:  No changes for now  Please decrease the torsemide down to 20 mg twice a day, Alternating with 20 mg daily  If you need a refill on your cardiac medications before your next appointment, please call your pharmacy.    Lab work: No new labs needed   If you have labs (blood work) drawn today and your tests are completely normal, you will receive your results only by: Marland Kitchen MyChart Message (if you have MyChart) OR . A paper copy in the mail If you have any lab test that is abnormal or we need to change your treatment, we will call you to review the results.   Testing/Procedures: No new testing needed   Follow-Up: At [facility], you and your health needs are our priority.  As part of our continuing mission to provide you with exceptional heart care, we have created designated Provider Care Teams.  These Care Teams include your primary Cardiologist (physician) and Advanced Practice Providers (APPs -  Physician Assistants and Nurse Practitioners) who all work together to provide you with the care you need, when you need it.  . You will need a follow up appointment in 12 months  . Providers on your designated Care Team:   . Nicolasa Ducking, NP . Eula Listen, PA-C . Marisue Ivan, PA-C  Any Other Special Instructions Will Be Listed Below (If Applicable).  COVID-19 Vaccine Information can be found at: [email or web address] For questions related to vaccine distribution or appointments, please email [email or web address] or call [phone].

## 2020-08-17 ENCOUNTER — Other Ambulatory Visit: Payer: Self-pay | Admitting: Cardiovascular Disease

## 2020-09-26 ENCOUNTER — Encounter: Payer: Self-pay | Admitting: Family Medicine

## 2020-09-26 ENCOUNTER — Ambulatory Visit (INDEPENDENT_AMBULATORY_CARE_PROVIDER_SITE_OTHER): Payer: PPO | Admitting: Family Medicine

## 2020-09-26 ENCOUNTER — Other Ambulatory Visit: Payer: Self-pay

## 2020-09-26 VITALS — BP 123/85 | HR 76 | Temp 98.1°F | Resp 16 | Wt 195.0 lb

## 2020-09-26 DIAGNOSIS — I480 Paroxysmal atrial fibrillation: Secondary | ICD-10-CM | POA: Diagnosis not present

## 2020-09-26 DIAGNOSIS — R42 Dizziness and giddiness: Secondary | ICD-10-CM

## 2020-09-26 MED ORDER — APIXABAN 5 MG PO TABS: 5.0000 mg | ORAL_TABLET | Freq: Two times a day (BID) | ORAL | 4 refills | Status: DC

## 2020-09-26 NOTE — Patient Instructions (Signed)
Please review the attached list of medications and notify my office if there are any errors.   You can start reducing torsemide to 1/2 tablet daily, but you can take the other half whenever you have swelling in your feet or fluid retention.

## 2020-09-26 NOTE — Progress Notes (Signed)
Established patient visit   Patient: Samuel Woods   DOB: [birth year]   60 y.o. Male  MRN: [id number] Visit Date: 09/26/2020  Today's healthcare provider: Mila Merry, MD   Chief Complaint  Patient presents with   Dizziness   Subjective    Dizziness This is a new problem. Episode onset: 1 month ago. The problem has been unchanged. Associated symptoms include weakness. Pertinent negatives include no abdominal pain, chest pain, chills, fever, nausea or vomiting. Associated symptoms comments: Frequent falls. Exacerbated by: sitting for longer than 10 minutes, then walking. Also happens when taking a shower. He has tried rest for the symptoms.   Patient states he has had a couple of falls due to the dizziness. No recent medications changes.      Medications: Outpatient Medications Prior to Visit  Medication Sig   ALPRAZolam (XANAX) 1 MG tablet TAKE 1/2 TO 1 (ONE-HALF TO ONE) TABLET BY MOUTH THREE TIMES DAILY AS NEEDED   amiodarone (PACERONE) 200 MG tablet Take 1 tablet by mouth once daily   apixaban (ELIQUIS) 5 MG TABS tablet Take 5 mg by mouth 2 (two) times daily.   atorvastatin (LIPITOR) 40 MG tablet TAKE 1 TABLET BY MOUTH ONCE DAILY IN THE EVENING   carvedilol (COREG) 12.5 MG tablet Take 2 tablets (25 mg total) by mouth 2 (two) times daily.   cyclobenzaprine (FLEXERIL) 5 MG tablet TAKE 1 TABLET BY MOUTH EVERY 6 HOURS AS NEEDED FOR MUSCLE SPASM   ENTRESTO 97-103 MG Take 0.5 tablets by mouth 2 (two) times daily.   ibuprofen (ADVIL,MOTRIN) 200 MG tablet Take 400 mg by mouth every 8 (eight) hours as needed (for pain.).    metFORMIN (GLUCOPHAGE-XR) 500 MG 24 hr tablet Take 1 tablet (500 mg total) by mouth daily with breakfast.   Multiple Vitamin (MULTIVITAMIN WITH MINERALS) TABS tablet Take 1 tablet by mouth daily. Centrum Silver Men 50+   torsemide (DEMADEX) 20 MG tablet Take 1 tablet (20 mg total) by mouth 2 (two) times daily. Alternate to 20 mg daily   No facility-administered  medications prior to visit.    Review of Systems  Constitutional:  Negative for appetite change, chills and fever.  Respiratory:  Positive for shortness of breath (unchanged from baseline). Negative for chest tightness and wheezing.   Cardiovascular:  Negative for chest pain and palpitations.  Gastrointestinal:  Negative for abdominal pain, nausea and vomiting.  Neurological:  Positive for dizziness and weakness.      Objective    BP 123/85 (BP Location: Right Arm, Patient Position: Sitting, Cuff Size: Normal)   Pulse 76   Temp 98.1 F (36.7 C) (Temporal)   Resp 16   Wt 195 lb (88.5 kg)   BMI 26.45 kg/m     Physical Exam   General: Appearance:     Well developed, well nourished male in no acute distress  Eyes:    PERRL, conjunctiva/corneas clear, EOM's intact       Lungs:     Clear to auscultation bilaterally, respirations unlabored  Heart:    Normal heart rate. Regularly irregular rhythm.  2/6 blowing, holosystolic murmur at apex   MS:   All extremities are intact.    Neurologic:   Awake, alert, oriented x 3. No apparent focal neurological defect.        Assessment & Plan     1. Dizziness By history is c/w orthostatic hypotension. Is euvolemic today, may reduce torsemide to 1/2 tablet daily, and only take  second tablet if having swelling.   - CBC - Comprehensive metabolic panel  2. Intermittent atrial fibrillation (HCC) refill- apixaban (ELIQUIS) 5 MG TABS tablet; Take 1 tablet (5 mg total) by mouth 2 (two) times daily.  Dispense: 180 tablet; Refill: 4        The entirety of the information documented in the History of Present Illness, Review of Systems and Physical Exam were personally obtained by me. Portions of this information were initially documented by the CMA and reviewed by me for thoroughness and accuracy.     Mila Merry, MD  [facility] [phone] (phone) [phone] (fax)  [facility]

## 2020-09-27 LAB — CBC
Hematocrit: 43.7 % (ref 37.5–51.0)
Hemoglobin: 15.1 g/dL (ref 13.0–17.7)
MCH: 31.9 pg (ref 26.6–33.0)
MCHC: 34.6 g/dL (ref 31.5–35.7)
MCV: 92 fL (ref 79–97)
Platelets: 291 10*3/uL (ref 150–450)
RBC: 4.74 x10E6/uL (ref 4.14–5.80)
RDW: 13.1 % (ref 11.6–15.4)
WBC: 9.1 10*3/uL (ref 3.4–10.8)

## 2020-09-27 LAB — COMPREHENSIVE METABOLIC PANEL
ALT: 84 IU/L — ABNORMAL HIGH (ref 0–44)
AST: 58 IU/L — ABNORMAL HIGH (ref 0–40)
Albumin/Globulin Ratio: 1.8 (ref 1.2–2.2)
Albumin: 4.5 g/dL (ref 3.8–4.8)
Alkaline Phosphatase: 78 IU/L (ref 44–121)
BUN/Creatinine Ratio: 14 (ref 10–24)
BUN: 24 mg/dL (ref 8–27)
Bilirubin Total: 0.6 mg/dL (ref 0.0–1.2)
CO2: 22 mmol/L (ref 20–29)
Calcium: 9.3 mg/dL (ref 8.6–10.2)
Chloride: 98 mmol/L (ref 96–106)
Creatinine, Ser: 1.66 mg/dL — ABNORMAL HIGH (ref 0.76–1.27)
Globulin, Total: 2.5 g/dL (ref 1.5–4.5)
Glucose: 159 mg/dL — ABNORMAL HIGH (ref 65–99)
Potassium: 3.7 mmol/L (ref 3.5–5.2)
Sodium: 140 mmol/L (ref 134–144)
Total Protein: 7 g/dL (ref 6.0–8.5)
eGFR: 47 mL/min/{1.73_m2} — ABNORMAL LOW (ref >59)

## 2020-10-27 ENCOUNTER — Other Ambulatory Visit: Payer: Self-pay | Admitting: Family Medicine

## 2020-10-27 DIAGNOSIS — M545 Low back pain, unspecified: Secondary | ICD-10-CM

## 2020-10-27 NOTE — Telephone Encounter (Signed)
Requested medication (s) are due for refill today - yes  Requested medication (s) are on the active medication list -yes  Future visit scheduled -yes  Last refill: 02/14/20 #60 5RF  Notes to clinic: Request RF: non delegated Rx  Requested Prescriptions  Pending Prescriptions Disp Refills   cyclobenzaprine (FLEXERIL) 5 MG tablet [Pharmacy Med Name: Cyclobenzaprine HCl 5 MG Oral Tablet] 60 tablet 0    Sig: TAKE 1 TABLET BY MOUTH EVERY 6 HOURS AS NEEDED FOR MUSCLE SPASM     Not Delegated - Analgesics:  Muscle Relaxants Failed - 10/27/2020 11:06 AM      Failed - This refill cannot be delegated      Passed - Valid encounter within last 6 months    Recent Outpatient Visits           1 month ago Dizziness   [facility] Malva Limes, MD   3 months ago Type 2 diabetes mellitus without complication, without long-term current use of insulin (HCC)   [facility] Malva Limes, MD   6 months ago Type 2 diabetes mellitus without complication, without long-term current use of insulin (HCC)   [facility] Sherrie Mustache, Demetrios Isaacs, MD   10 months ago Other fatigue   [facility] Malva Limes, MD   2 years ago Dyspnea, unspecified type   [facility] Malva Limes, MD       Future Appointments             In 1 week Fisher, Demetrios Isaacs, MD [facility]               Requested Prescriptions  Pending Prescriptions Disp Refills   cyclobenzaprine (FLEXERIL) 5 MG tablet [Pharmacy Med Name: Cyclobenzaprine HCl 5 MG Oral Tablet] 60 tablet 0    Sig: TAKE 1 TABLET BY MOUTH EVERY 6 HOURS AS NEEDED FOR MUSCLE SPASM     Not Delegated - Analgesics:  Muscle Relaxants Failed - 10/27/2020 11:06 AM      Failed - This refill cannot be delegated      Passed - Valid encounter within last 6 months    Recent Outpatient Visits           1 month ago Dizziness   [facility] Malva Limes, MD    3 months ago Type 2 diabetes mellitus without complication, without long-term current use of insulin (HCC)   [facility] Malva Limes, MD   6 months ago Type 2 diabetes mellitus without complication, without long-term current use of insulin [facility])   [facility] Malva Limes, MD   10 months ago Other fatigue   [facility] Malva Limes, MD   2 years ago Dyspnea, unspecified type   [facility] Malva Limes, MD       Future Appointments             In 1 week Fisher, Demetrios Isaacs, MD [facility]

## 2020-10-30 ENCOUNTER — Other Ambulatory Visit: Payer: Self-pay | Admitting: Family Medicine

## 2020-10-30 DIAGNOSIS — E119 Type 2 diabetes mellitus without complications: Secondary | ICD-10-CM

## 2020-11-07 ENCOUNTER — Other Ambulatory Visit: Payer: Self-pay

## 2020-11-07 ENCOUNTER — Encounter: Payer: Self-pay | Admitting: Family Medicine

## 2020-11-07 ENCOUNTER — Ambulatory Visit (INDEPENDENT_AMBULATORY_CARE_PROVIDER_SITE_OTHER): Payer: PPO | Admitting: Family Medicine

## 2020-11-07 VITALS — BP 127/87 | HR 68 | Temp 97.8°F | Wt 195.0 lb

## 2020-11-07 DIAGNOSIS — E119 Type 2 diabetes mellitus without complications: Secondary | ICD-10-CM

## 2020-11-07 DIAGNOSIS — E782 Mixed hyperlipidemia: Secondary | ICD-10-CM

## 2020-11-07 DIAGNOSIS — I5022 Chronic systolic (congestive) heart failure: Secondary | ICD-10-CM | POA: Insufficient documentation

## 2020-11-07 DIAGNOSIS — I5189 Other ill-defined heart diseases: Secondary | ICD-10-CM | POA: Diagnosis not present

## 2020-11-07 DIAGNOSIS — I10 Essential (primary) hypertension: Secondary | ICD-10-CM

## 2020-11-07 DIAGNOSIS — Z23 Encounter for immunization: Secondary | ICD-10-CM | POA: Diagnosis not present

## 2020-11-07 LAB — POCT GLYCOSYLATED HEMOGLOBIN (HGB A1C): Hemoglobin A1C: 6.8 % — AB (ref 4.0–5.6)

## 2020-11-07 MED ORDER — TORSEMIDE 5 MG PO TABS: 5.0000 mg | ORAL_TABLET | Freq: Every day | ORAL | 1 refills | Status: DC

## 2020-11-07 NOTE — Progress Notes (Signed)
Established patient visit   Patient: Samuel Woods   DOB: [birth year]   60 y.o. Male  MRN: [id number] Visit Date: 11/07/2020  Today's healthcare provider: Mila Merry, MD   Chief Complaint  Patient presents with   Diabetes   Subjective    HPI  Diabetes Mellitus Type II, follow-up  Lab Results  Component Value Date   HGBA1C 7.1 (A) 07/11/2020   HGBA1C 7.1 (A) 04/03/2020   HGBA1C 6.9 (H) 12/20/2019   Last seen for diabetes 4 months ago.  Management since then includes  starting metformin He reports excellent compliance with treatment. He is not having side effects.   Home blood sugar records:  are not being checked  Episodes of hypoglycemia? No    Current insulin regiment: None Most Recent Eye Exam: Pt is due for an eye exam   Symptoms: No appetite changes No foot ulcerations  No chest pain No chest pressure/discomfort  No dyspnea No orthopnea  No fatigue Yes lower extremity edema  No palpitations No paroxysmal nocturnal dyspnea  No nausea No numbness or tingling of extremity  No polydipsia No polyuria  No speech difficulty No syncope   He is following a Regular diet. Current exercise: walking  Last metabolic panel Lab Results  Component Value Date   GLUCOSE 159 (H) 09/26/2020   NA 140 09/26/2020   K 3.7 09/26/2020   BUN 24 09/26/2020   CREATININE 1.66 (H) 09/26/2020   GFRNONAA 60 12/20/2019   GFRAA 70 12/20/2019   CALCIUM 9.3 09/26/2020   AST 58 (H) 09/26/2020   ALT 84 (H) 09/26/2020   The 10-year ASCVD risk score (Arnett DK, et al., 2019) is: 20.2%  ---------------------------------------------------------------------------------------------------  His only complaints today is he continues to feel fatigued and dizzy, and occassionally falls from dizziness if he stands up to quickly. He has been cut back on torsemide to 1/2 x 20mg  a day, but hasn't noticed much change in sx.    Medications: Outpatient Medications Prior to Visit   Medication Sig   ALPRAZolam (XANAX) 1 MG tablet TAKE 1/2 TO 1 (ONE-HALF TO ONE) TABLET BY MOUTH THREE TIMES DAILY AS NEEDED   amiodarone (PACERONE) 200 MG tablet Take 1 tablet by mouth once daily   apixaban (ELIQUIS) 5 MG TABS tablet Take 1 tablet (5 mg total) by mouth 2 (two) times daily.   atorvastatin (LIPITOR) 40 MG tablet TAKE 1 TABLET BY MOUTH ONCE DAILY IN THE EVENING   carvedilol (COREG) 12.5 MG tablet Take 2 tablets (25 mg total) by mouth 2 (two) times daily.   cyclobenzaprine (FLEXERIL) 5 MG tablet TAKE 1 TABLET BY MOUTH EVERY 6 HOURS AS NEEDED FOR MUSCLE SPASM   ENTRESTO 97-103 MG Take 0.5 tablets by mouth 2 (two) times daily.   ibuprofen (ADVIL,MOTRIN) 200 MG tablet Take 400 mg by mouth every 8 (eight) hours as needed (for pain.).    metFORMIN (GLUCOPHAGE-XR) 500 MG 24 hr tablet Take 1 tablet by mouth once daily with breakfast   Multiple Vitamin (MULTIVITAMIN WITH MINERALS) TABS tablet Take 1 tablet by mouth daily. Centrum Silver Men 50+   torsemide (DEMADEX) 20 MG tablet Take 1 tablet (20 mg total) by mouth 2 (two) times daily. Alternate to 20 mg daily (Patient taking differently: Take 20 mg by mouth daily.)   No facility-administered medications prior to visit.    Review of Systems  Constitutional: Negative.   Respiratory: Negative.    Cardiovascular:  Positive for leg swelling (Left worse  than right.). Negative for chest pain and palpitations.  Gastrointestinal: Negative.   Endocrine: Negative.   Musculoskeletal:  Positive for arthralgias, back pain and gait problem.  Skin:  Negative for wound.  Neurological:  Positive for dizziness. Negative for light-headedness and headaches.      Objective    BP 127/87 (BP Location: Right Arm, Patient Position: Sitting, Cuff Size: Large)   Pulse 68   Temp 97.8 F (36.6 C) (Oral)   Wt 195 lb (88.5 kg)   SpO2 98%   BMI 26.45 kg/m    Physical Exam   General: Appearance:     Overweight male in no acute distress  Eyes:     PERRL, conjunctiva/corneas clear, EOM's intact       Lungs:     Clear to auscultation bilaterally, respirations unlabored  Heart:    Normal heart rate. Regular rhythm.  2/6 blowing, holosystolic murmur at apex   MS:   All extremities are intact.  No edema  Neurologic:   Awake, alert, oriented x 3. No apparent focal neurological defect.         Results for orders placed or performed in visit on 11/07/20  POCT glycosylated hemoglobin (Hb A1C)  Result Value Ref Range   Hemoglobin A1C 6.8 (A) 4.0 - 5.6 %  Last CBC Lab Results  Component Value Date   WBC 9.1 09/26/2020   HGB 15.1 09/26/2020   HCT 43.7 09/26/2020   MCV 92 09/26/2020   MCH 31.9 09/26/2020   RDW 13.1 09/26/2020   PLT 291 09/26/2020     Assessment & Plan     1. Controlled type 2 diabetes mellitus without complication, without long-term current use of insulin (HCC) Well controlled.  Continue current medications.    2. Chronic systolic heart failure (HCC) Stable, followed by Dr. Mariah Milling. He Is having symptoms of orthostasis, will reduce torsemide from 1/2 x 20mg  to torsemide (DEMADEX) 5 MG tablet; Take 1-2 tablets (5-10 mg total) by mouth daily.  Dispense: 180 tablet; Refill: 1  3. Mixed hyperlipidemia He is tolerating atorvastatin well with no adverse effects.    4. Diastolic dysfunction Reduce to  torsemide (DEMADEX) 5 MG tablet; Take 1-2 tablets (5-10 mg total) by mouth daily.  Dispense: 180 tablet; Refill: 1  5. Primary hypertension Fairly well controlled.   6. Need for influenza vaccination  - Flu Vaccine QUAD 6+ mos PF IM (Fluarix Quad PF)    Future Appointments  Date Time Provider Department Center  05/08/2021  2:00 PM 05/10/2021, Sherrie Mustache, MD [facility]        The entirety of the information documented in the History of Present Illness, Review of Systems and Physical Exam were personally obtained by me. Portions of this information were initially documented by the CMA and reviewed by me for thoroughness  and accuracy.     Demetrios Isaacs, MD  [facility] [phone] (phone) [phone] (fax)  [facility]

## 2020-11-23 ENCOUNTER — Other Ambulatory Visit: Payer: Self-pay | Admitting: Cardiovascular Disease

## 2020-11-29 ENCOUNTER — Other Ambulatory Visit: Payer: Self-pay

## 2020-11-29 ENCOUNTER — Telehealth: Payer: Self-pay | Admitting: Cardiovascular Disease

## 2020-11-29 MED ORDER — ENTRESTO 97-103 MG PO TABS: 0.5000 | ORAL_TABLET | Freq: Two times a day (BID) | ORAL | 3 refills | Status: DC

## 2020-11-29 NOTE — Telephone Encounter (Signed)
*  STAT* If patient is at the pharmacy, call can be transferred to refill team.   1. Which medications need to be refilled? (please list name of each medication and dose if known)  Entresto 97-103 MG 0.5 tablets 2 times daily  (Wants to stay on high dose and cut in half )  2. Which pharmacy/location (including street and city if local pharmacy) is medication to be sent to? [facility] on [address]   3. Do they need a 30 day or 90 day supply? 90 day

## 2020-11-29 NOTE — Telephone Encounter (Signed)
ENTRESTO 97-103 MG 60 tablet 3 11/29/2020    Sig - Route: Take 0.5 tablets by mouth 2 (two) times daily. - Oral    Pharmacy  [id number] - [place], [place] - [address]

## 2020-12-26 ENCOUNTER — Other Ambulatory Visit: Payer: Self-pay | Admitting: Cardiovascular Disease

## 2021-01-18 ENCOUNTER — Other Ambulatory Visit: Payer: Self-pay | Admitting: Family Medicine

## 2021-01-18 DIAGNOSIS — F419 Anxiety disorder, unspecified: Secondary | ICD-10-CM

## 2021-01-18 NOTE — Telephone Encounter (Signed)
Refill if appropriate.  Please advise. Last filled 12/08/20.

## 2021-03-05 ENCOUNTER — Other Ambulatory Visit: Payer: Self-pay | Admitting: Cardiovascular Disease

## 2021-05-04 IMAGING — CR CHEST - 2 VIEW
1 series · 2 of 2 positions shown · non-contrast
Comparison: July 06, 2017.

CLINICAL DATA: Shortness of breath

EXAM:
CHEST - 2 VIEW

[Series 1: dg chest 2 view · 0.14mm/px · 2 of 2 slices shown]
[im 1/2]
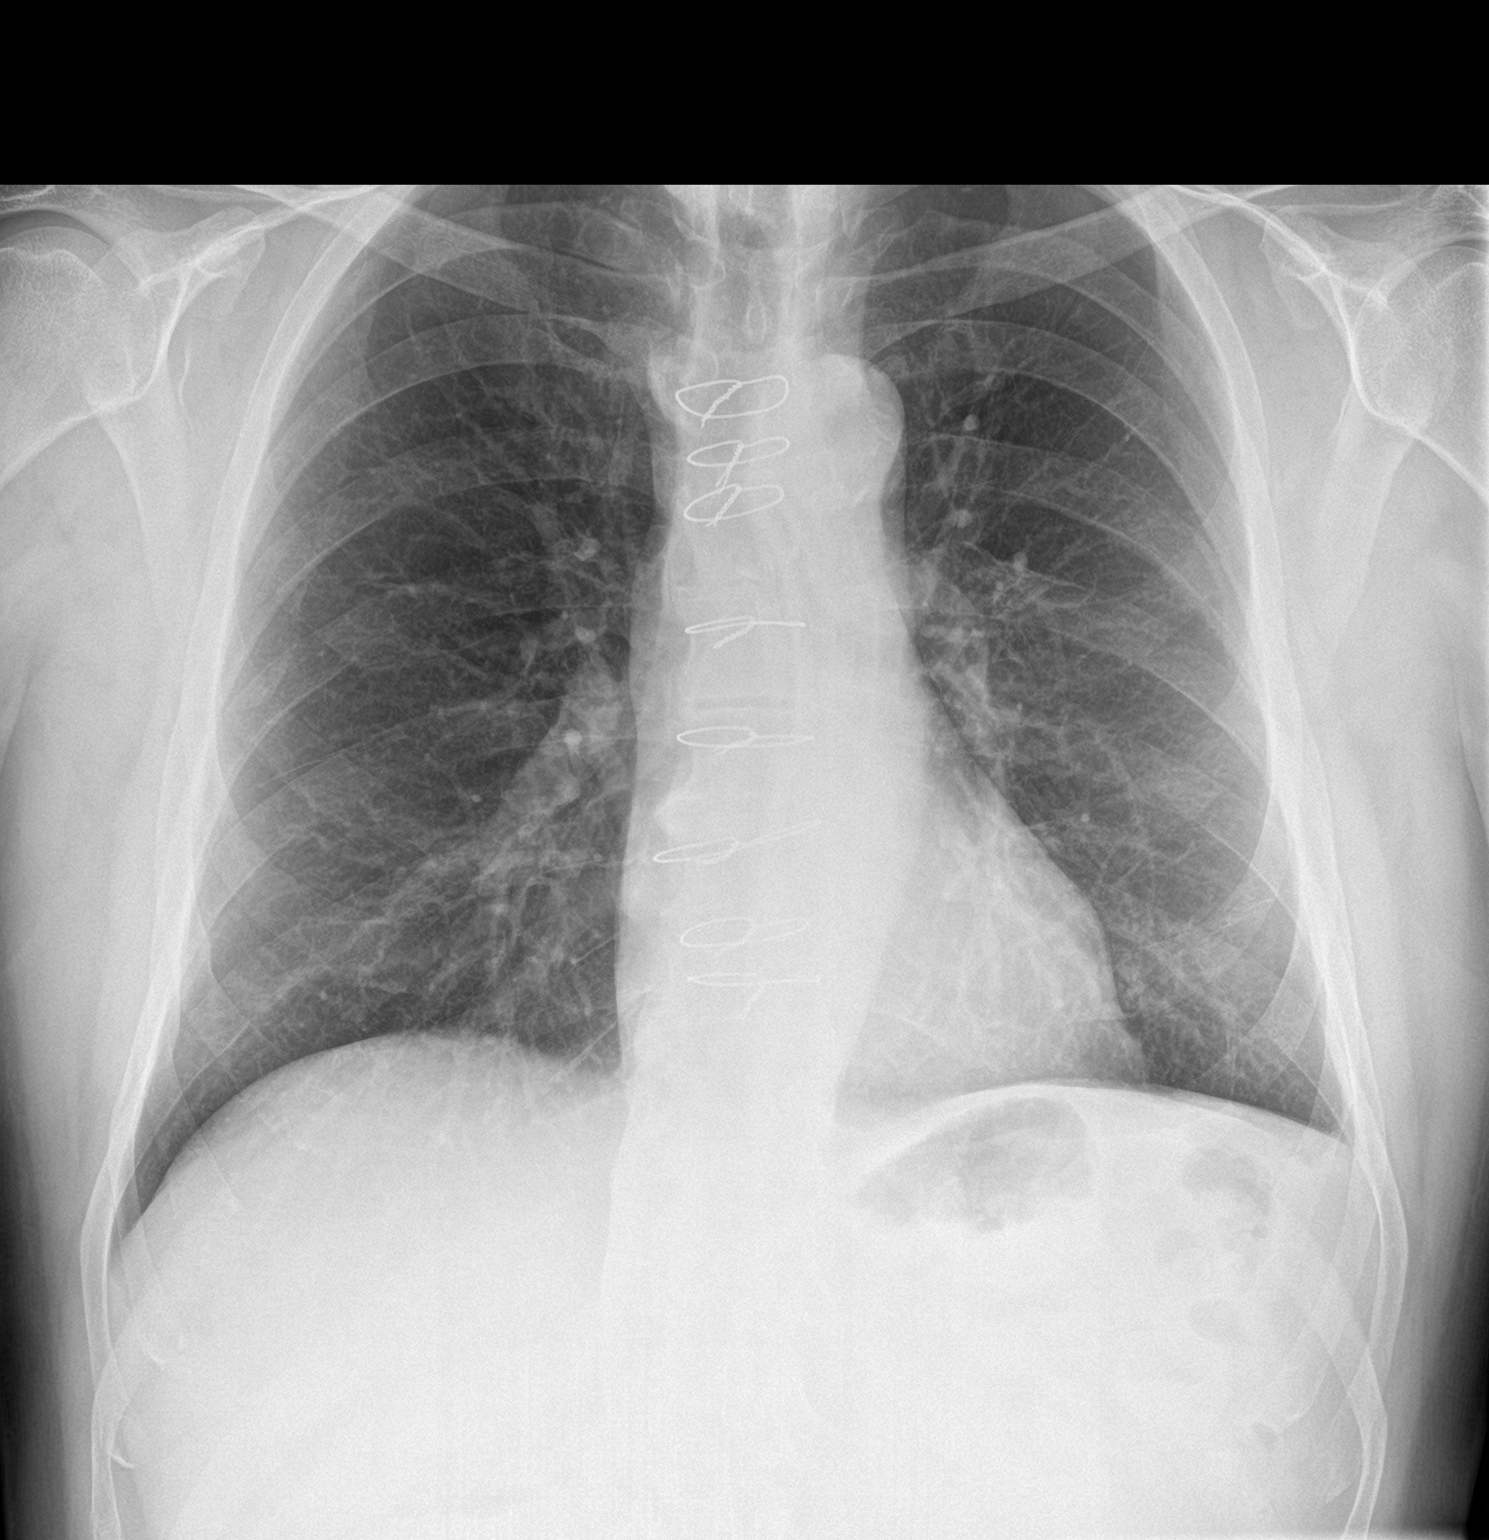
[im 2/2]
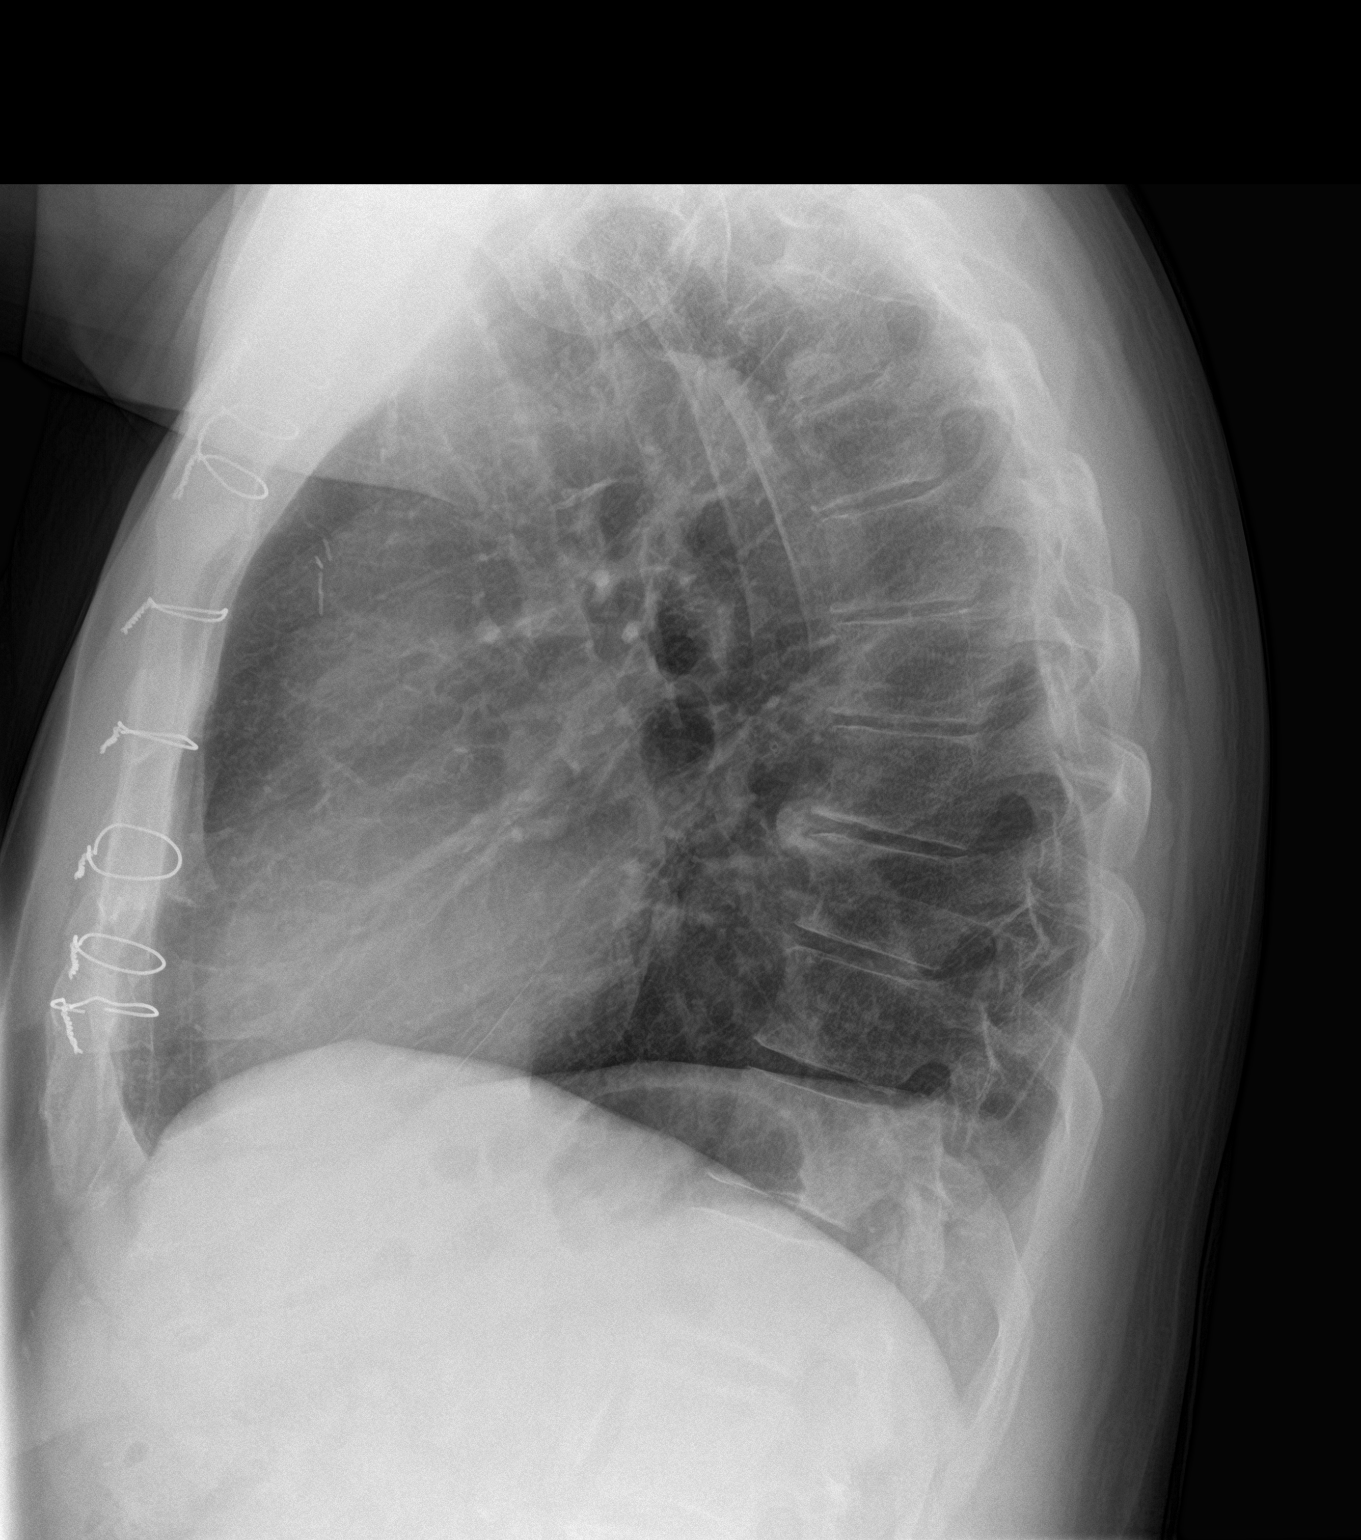

[2 of 2 positions shown; findings below may reference images not displayed]

FINDINGS: There is no evident edema or consolidation. Heart size and pulmonary
vascularity are normal. No adenopathy. Patient is status post median
sternotomy. There is aortic atherosclerosis. There is postoperative
change in the lower cervical spine.
IMPRESSION: No edema or consolidation. Heart size normal. Aortic Atherosclerosis
([id number]).

## 2021-05-08 ENCOUNTER — Other Ambulatory Visit: Payer: Self-pay

## 2021-05-08 ENCOUNTER — Ambulatory Visit (INDEPENDENT_AMBULATORY_CARE_PROVIDER_SITE_OTHER): Payer: PPO | Admitting: Family Medicine

## 2021-05-08 ENCOUNTER — Encounter: Payer: Self-pay | Admitting: Family Medicine

## 2021-05-08 VITALS — BP 110/65 | HR 50 | Temp 97.8°F | Resp 14 | Ht 72.0 in | Wt 184.0 lb

## 2021-05-08 DIAGNOSIS — Z87891 Personal history of nicotine dependence: Secondary | ICD-10-CM

## 2021-05-08 DIAGNOSIS — Z125 Encounter for screening for malignant neoplasm of prostate: Secondary | ICD-10-CM

## 2021-05-08 DIAGNOSIS — I4891 Unspecified atrial fibrillation: Secondary | ICD-10-CM

## 2021-05-08 DIAGNOSIS — Z23 Encounter for immunization: Secondary | ICD-10-CM

## 2021-05-08 DIAGNOSIS — I5189 Other ill-defined heart diseases: Secondary | ICD-10-CM

## 2021-05-08 DIAGNOSIS — I519 Heart disease, unspecified: Secondary | ICD-10-CM

## 2021-05-08 DIAGNOSIS — G4700 Insomnia, unspecified: Secondary | ICD-10-CM

## 2021-05-08 DIAGNOSIS — Z289 Immunization not carried out for unspecified reason: Secondary | ICD-10-CM

## 2021-05-08 DIAGNOSIS — Z0000 Encounter for general adult medical examination without abnormal findings: Secondary | ICD-10-CM | POA: Diagnosis not present

## 2021-05-08 DIAGNOSIS — I10 Essential (primary) hypertension: Secondary | ICD-10-CM | POA: Diagnosis not present

## 2021-05-08 DIAGNOSIS — E119 Type 2 diabetes mellitus without complications: Secondary | ICD-10-CM | POA: Diagnosis not present

## 2021-05-08 DIAGNOSIS — E782 Mixed hyperlipidemia: Secondary | ICD-10-CM

## 2021-05-08 MED ORDER — TRAZODONE HCL 100 MG PO TABS: 50.0000 mg | ORAL_TABLET | Freq: Every evening | ORAL | 1 refills | Status: DC | PRN

## 2021-05-08 MED ORDER — SHINGRIX 50 MCG/0.5ML IM SUSR: 0.5000 mL | Freq: Once | INTRAMUSCULAR | 0 refills | Status: AC

## 2021-05-08 MED ORDER — TETANUS-DIPHTH-ACELL PERTUSSIS 5-2.5-18.5 LF-MCG/0.5 IM SUSY: 0.5000 mL | PREFILLED_SYRINGE | Freq: Once | INTRAMUSCULAR | 0 refills | Status: AC

## 2021-05-08 NOTE — Patient Instructions (Addendum)
Please review the attached list of medications and notify my office if there are any errors.  ? ?Please bring all of your medications to every appointment so we can make sure that our medication list is the same as yours.  ? ?Reduce alprazolam to no more than 1 tablet before bedtime. Try to stop all together if you are sleeping Ok after 2-3 weeks ? ?Please contact your eyecare professional to schedule a routine eye exam  ?

## 2021-05-08 NOTE — Progress Notes (Signed)
I,Roshena L Chambers,acting as a scribe for Mila Merry, MD.,have documented all relevant documentation on the behalf of Mila Merry, MD,as directed by  Mila Merry, MD while in [name], MD.   Complete Physical Exam      Patient: Samuel Woods, Male    DOB: 1960/10/04, 61 y.o.   MRN: [id number] Visit Date: 05/08/2021  Today's Provider: Mila Merry, MD   Chief Complaint  Patient presents with   Annual Exam   Subjective    Samuel Woods is a 61 y.o. male who presents today for his complete physical examination  He reports consuming a general diet. Home exercise routine includes walking. He generally feels fairly well. He reports sleeping poorly. He does have additional problems to discuss today.   HPI Diabetes Mellitus Type II, Follow-up  Lab Results  Component Value Date   HGBA1C 6.8 (A) 11/07/2020   HGBA1C 7.1 (A) 07/11/2020   HGBA1C 7.1 (A) 04/03/2020   Wt Readings from Last 3 Encounters:  11/07/20 195 lb (88.5 kg)  09/26/20 195 lb (88.5 kg)  07/18/20 195 lb 8 oz (88.7 kg)   Last seen for diabetes 6 months ago.  Management since then includes continue same medications. He reports good compliance with treatment. He is not having side effects.  Symptoms: No fatigue No foot ulcerations  No appetite changes No nausea  No paresthesia of the feet  No polydipsia  No polyuria No visual disturbances   No vomiting     Home blood sugar records: blood sugars are not checked  Episodes of hypoglycemia? No    Current insulin regiment: none Most Recent Eye Exam: > 1 year ago Current exercise: none Current diet habits: well balanced  Pertinent Labs: Lab Results  Component Value Date   NA 140 09/26/2020   K 3.7 09/26/2020   CREATININE 1.66 (H) 09/26/2020   EGFR 47 (L) 09/26/2020     ---------------------------------------------------------------------------------------------------   Hypertension, follow-up  BP Readings from Last 3  Encounters:  11/07/20 127/87  09/26/20 123/85  07/18/20 130/88   Wt Readings from Last 3 Encounters:  11/07/20 195 lb (88.5 kg)  09/26/20 195 lb (88.5 kg)  07/18/20 195 lb 8 oz (88.7 kg)     He was last seen for hypertension 6 months ago.  BP at that visit was 127/87. Management since that visit includes continue same medication.  He reports good compliance with treatment. He is not having side effects.  He is following a Regular diet. He is not exercising. He does not smoke.  Use of agents associated with hypertension: none.   Outside blood pressures are not checked. Symptoms: No chest pain No chest pressure  No palpitations No syncope  yes dyspnea No orthopnea  No paroxysmal nocturnal dyspnea No lower extremity edema    ---------------------------------------------------------------------------------------------------   Lipid/Cholesterol, Follow-up  Last lipid panel Other pertinent labs  Lab Results  Component Value Date   CHOL 197 12/20/2019   HDL 47 12/20/2019   LDLCALC 119 (H) 12/20/2019   TRIG 176 (H) 12/20/2019   CHOLHDL 4.2 12/20/2019   Lab Results  Component Value Date   ALT 84 (H) 09/26/2020   AST 58 (H) 09/26/2020   PLT 291 09/26/2020   TSH 1.500 10/12/2018     He was last seen for this 6 months ago.  Management since that visit includes continue same medication.  He reports good compliance with treatment. He is not having side effects.   Symptoms: No  chest pain No chest pressure/discomfort  yes dyspnea No lower extremity edema  No numbness or tingling of extremity No orthopnea  No palpitations No paroxysmal nocturnal dyspnea  No speech difficulty No syncope   Current diet: well balanced Current exercise: none  The 10-year ASCVD risk score (Arnett DK, et al., 2019) is: 20.2%   He also reports he is having trouble sleeping and alprazolam does not seem to help anymore. He is taking 1 every morning for anxiety and two before going to bed.  Tried zolpidem in the past which caused hangover effect.   Medications: Outpatient Medications Prior to Visit  Medication Sig   ALPRAZolam (XANAX) 1 MG tablet TAKE 1/2 TO 1 (ONE-HALF TO ONE) TABLET BY MOUTH THREE TIMES DAILY AS NEEDED (Patient taking differently: One in the morning and two at bedtime)   amiodarone (PACERONE) 200 MG tablet Take 1 tablet by mouth once daily   apixaban (ELIQUIS) 5 MG TABS tablet Take 1 tablet (5 mg total) by mouth 2 (two) times daily.   atorvastatin (LIPITOR) 40 MG tablet TAKE 1 TABLET BY MOUTH ONCE DAILY IN THE EVENING   carvedilol (COREG) 12.5 MG tablet Take 2 tablets by mouth twice daily   cyclobenzaprine (FLEXERIL) 5 MG tablet TAKE 1 TABLET BY MOUTH EVERY 6 HOURS AS NEEDED FOR MUSCLE SPASM   ENTRESTO 97-103 MG Take 0.5 tablets by mouth 2 (two) times daily.   ibuprofen (ADVIL,MOTRIN) 200 MG tablet Take 400 mg by mouth every 8 (eight) hours as needed (for pain.).    metFORMIN (GLUCOPHAGE-XR) 500 MG 24 hr tablet Take 1 tablet by mouth once daily with breakfast   Multiple Vitamin (MULTIVITAMIN WITH MINERALS) TABS tablet Take 1 tablet by mouth daily. Centrum Silver Men 50+   torsemide (DEMADEX) 5 MG tablet Take 1-2 tablets (5-10 mg total) by mouth daily.   No facility-administered medications prior to visit.    Allergies  Allergen Reactions   Dilaudid [Hydromorphone Hcl] Other (See Comments)    According to patient's wife he had hallucinations after having dilaudid    Patient Care Team: Malva Limes, MD as PCP - General (Family Medicine) Laurier Nancy, MD as Consulting Physician (Cardiology) Andee Poles, [facility] (Inactive) (Pharmacist)  Review of Systems  Constitutional:  Negative for appetite change, chills, fatigue and fever.  HENT:  Positive for rhinorrhea and sneezing. Negative for congestion, ear pain, hearing loss, nosebleeds and trouble swallowing.   Eyes:  Negative for pain and visual disturbance.  Respiratory:  Positive for shortness of  breath. Negative for cough and chest tightness.   Cardiovascular:  Negative for chest pain, palpitations and leg swelling.  Gastrointestinal:  Positive for diarrhea. Negative for abdominal pain, blood in stool, constipation, nausea and vomiting.  Endocrine: Negative for polydipsia, polyphagia and polyuria.  Genitourinary:  Negative for dysuria and flank pain.  Musculoskeletal:  Positive for back pain. Negative for arthralgias, joint swelling, myalgias and neck stiffness.  Skin:  Negative for color change, rash and wound.  Neurological:  Negative for dizziness, tremors, seizures, speech difficulty, weakness, light-headedness and headaches.  Psychiatric/Behavioral:  Negative for behavioral problems, confusion, decreased concentration, dysphoric mood and sleep disturbance. The patient is not nervous/anxious.   All other systems reviewed and are negative.      Objective    Vitals: BP 110/65 (BP Location: Left Arm, Patient Position: Sitting, Cuff Size: Normal)   Pulse (!) 50   Temp 97.8 F (36.6 C) (Oral)   Resp 14   Ht 6' (1.829 m)  Wt 184 lb (83.5 kg)   SpO2 100% Comment: room air  BMI 24.95 kg/m     Physical Exam   General Appearance:    Well developed, well nourished male. Alert, cooperative, in no acute distress, appears stated age  Head:    Normocephalic, without obvious abnormality, atraumatic  Eyes:    PERRL, conjunctiva/corneas clear, EOM's intact, fundi    benign, both eyes       Ears:    Normal TM's and external ear canals, both ears  Nose:   Nares normal, septum midline, mucosa normal, no drainage   or sinus tenderness  Throat:   Lips, mucosa, and tongue normal; teeth and gums normal  Neck:   Supple, symmetrical, trachea midline, no adenopathy;       thyroid:  No enlargement/tenderness/nodules; no carotid   bruit or JVD  Back:     Symmetric, no curvature, ROM normal, no CVA tenderness  Lungs:     Clear to auscultation bilaterally, respirations unlabored  Chest  wall:    No tenderness or deformity  Heart:    Bradycardic. Irregularly irregular rhythm.  2/6 blowing, holosystolic murmur at apex S1 and S2 normal  Abdomen:     Soft, non-tender, bowel sounds active all four quadrants,    no masses, no organomegaly  Genitalia:    deferred  Rectal:    deferred  Extremities:   All extremities are intact. No cyanosis or edema  Pulses:   2+ and symmetric all extremities  Skin:   Skin color, texture, turgor normal, no rashes or lesions  Lymph nodes:   Cervical, supraclavicular, and axillary nodes normal  Neurologic:   CNII-XII intact. Normal strength, sensation and reflexes      throughout     Assessment & Plan     1. Annual physical exam   2. Prostate cancer screening  - PSA Total (Reflex To Free) ([facility] only)  3. Primary hypertension Well controlled.  Continue current medications.    4. Atrial fibrillation, unspecified type (HCC) Stable, rate controlled, on DOAC. Due for cardiology follow up in May  5. Mixed hyperlipidemia He is tolerating atorvastatin well with no adverse effects.   - CBC - Comprehensive metabolic panel - Lipid panel  6. Systolic dysfunction   7. Diastolic dysfunction Asymptomatic. Compliant with medication.  Continue aggressive risk factor modification.    8. Controlled type 2 diabetes mellitus without complication, without long-term current use of insulin (HCC)  - Hemoglobin A1c  9. Insomnia, unspecified type No longer controlled on alprazolam despite taking 2 and sometimes 3 mg before bed. Will reducing bedtime alprazolam to 1mg  max and add traZODone (DESYREL) 100 MG tablet; Take 0.5-1 tablets (50-100 mg total) by mouth at bedtime as needed for sleep.  Dispense: 30 tablet; Refill: 1  Is to work on weaning off bedtime alprazolam if trazodone is effective.   10. Prescription for Shingrix. Vaccine not administered in office.   - Zoster Vaccine Adjuvanted [facility]) injection; Inject 0.5 mLs into the muscle once  for 1 dose. Repeat after 2 months  Dispense: 0.5 mL; Refill: 0  11. Prescription for Tdap. Vaccine not administered in office.   - Tdap (BOOSTRIX) 5-2.5-18.5 LF-MCG/0.5 injection; Inject 0.5 mLs into the muscle once for 1 dose.  Dispense: 0.5 mL; Refill: 0  12. Stopped smoking with greater than 20 pack year history  - Ambulatory Referral Lung Cancer Screening [facility]   Future Appointments  Date Time Provider Department Center  09/10/2021  3:00 PM Mila Merry  E, MD [facility]        The entirety of the information documented in the History of Present Illness, Review of Systems and Physical Exam were personally obtained by me. Portions of this information were initially documented by the CMA and reviewed by me for thoroughness and accuracy.     Mila Merry, MD  [facility] [phone] (phone) [phone] (fax)  [facility]

## 2021-05-08 NOTE — Progress Notes (Signed)
? ? ? ?Complete Physical Exam  ? ?  ? ?Patient: Samuel Woods, Male    DOB: 06-Jun-1960, 61 y.o.   MRN: [id number] ?Visit Date: 05/08/2021 ? ?Today's Provider: Mila Merry, MD  ? ?Chief Complaint  ?Patient presents with  ? Annual Exam  ? ?Subjective  ?  ?Samuel Woods is a 61 y.o. male who presents today for his complete physical examination  ?He reports consuming a general diet.  He generally feels fairly well. He reports sleeping fairly well. He does not have additional problems to discuss today.  ? ?He is also due for follow up multiple chronic medical problems including diabetes, atrial fibrillation, congestive heart failure, hyperlipidemia, anxiety and insomnia. He is taking his medications consistently.  Without adverse effects. He continues routine follow up with Dr. Mariah Milling for chf and a-fib. Feels fatigued, but no DOE or orthopnea, no abnormal bleeding or bruising.  ? ?He does reports he has trouble sleeping and would like to try medication to help him fall asleep and sleep through the night.  ? ?Medications: ?Outpatient Medications Prior to Visit  ?Medication Sig  ? ALPRAZolam (XANAX) 1 MG tablet TAKE 1/2 TO 1 (ONE-HALF TO ONE) TABLET BY MOUTH THREE TIMES DAILY AS NEEDED (Patient taking differently: One in the morning and two at bedtime)  ? amiodarone (PACERONE) 200 MG tablet Take 1 tablet by mouth once daily  ? apixaban (ELIQUIS) 5 MG TABS tablet Take 1 tablet (5 mg total) by mouth 2 (two) times daily.  ? atorvastatin (LIPITOR) 40 MG tablet TAKE 1 TABLET BY MOUTH ONCE DAILY IN THE EVENING  ? carvedilol (COREG) 12.5 MG tablet Take 2 tablets by mouth twice daily  ? cyclobenzaprine (FLEXERIL) 5 MG tablet TAKE 1 TABLET BY MOUTH EVERY 6 HOURS AS NEEDED FOR MUSCLE SPASM  ? ENTRESTO 97-103 MG Take 0.5 tablets by mouth 2 (two) times daily.  ? ibuprofen (ADVIL,MOTRIN) 200 MG tablet Take 400 mg by mouth every 8 (eight) hours as needed (for pain.).   ? metFORMIN (GLUCOPHAGE-XR) 500 MG 24 hr tablet Take 1 tablet by  mouth once daily with breakfast  ? Multiple Vitamin (MULTIVITAMIN WITH MINERALS) TABS tablet Take 1 tablet by mouth daily. Centrum Silver Men 50+  ? torsemide (DEMADEX) 5 MG tablet Take 1-2 tablets (5-10 mg total) by mouth daily.  ? ?No facility-administered medications prior to visit.  ?  ?Allergies  ?Allergen Reactions  ? Dilaudid [Hydromorphone Hcl] Other (See Comments)  ?  According to patient's wife he had hallucinations after having dilaudid  ? ? ?Patient Care Team: ?Malva Limes, MD as PCP - General (Family Medicine) ?Andee Poles, RPH (Inactive) (Pharmacist) ?Antonieta Iba, MD as Consulting Physician (Cardiology) ? ?  ? Objective  ?  ?Vitals: BP 110/65 (BP Location: Left Arm, Patient Position: Sitting, Cuff Size: Normal)   Pulse (!) 50   Temp 97.8 ?F (36.6 ?C) (Oral)   Resp 14   Ht 6' (1.829 m)   Wt 184 lb (83.5 kg)   SpO2 100% Comment: room air  BMI 24.95 kg/m?  ? ? ? ?Physical Exam ? ?General Appearance:    Well developed, well nourished male. Alert, cooperative, in no acute distress, appears stated age  ?Head:    Normocephalic, without obvious abnormality, atraumatic  ?Eyes:    PERRL, conjunctiva/corneas clear, EOM's intact, fundi  ?  benign, both eyes       ?Ears:    Normal TM's and external ear canals, both ears  ?Neck:  Supple, symmetrical, trachea midline, no adenopathy;     ?  thyroid:  No enlargement/tenderness/nodules; no carotid ?  bruit or JVD  ?Back:     Symmetric, no curvature, ROM normal, no CVA tenderness  ?Lungs:     Clear to auscultation bilaterally, respirations unlabored  ?Chest wall:    No tenderness or deformity  ?Heart:    Bradycardic. Irregularly irregular rhythm.  ?3/6 blowing, holosystolic murmur at apex S1 and S2 normal  ?Abdomen:     Soft, non-tender, bowel sounds active all four quadrants,  ?  no masses, no organomegaly  ?Genitalia:    deferred  ?Rectal:    deferred  ?Extremities:   All extremities are intact. No cyanosis or edema  ?Pulses:   2+ and symmetric  all extremities  ?Skin:   Skin color, texture, turgor normal, no rashes or lesions  ?Lymph nodes:   Cervical, supraclavicular, and axillary nodes normal  ?Neurologic:   CNII-XII intact. Normal strength, sensation and reflexes    ?  throughout  ?  ? ? Assessment & Plan  ?  ? ?1. Annual physical exam ? ? ?2. Prostate cancer screening ? ?- PSA Total (Reflex To Free) ([facility] only) ? ?3. Primary hypertension ?Very well controlled on current cardiac medications prescribed by Dr. Mariah Milling ? ?4. Atrial fibrillation, unspecified type (HCC) ?Rate well controlled. Tolerating DOAC without adverse effects.  ? ?5. Mixed hyperlipidemia ?He is tolerating atorvastatin well with no adverse effects.   ?- CBC ?- Comprehensive metabolic panel ?- Lipid panel ? ?6. Systolic dysfunction ? ? ?7. Diastolic dysfunction ?Reasonably well compensated. Continue current meds. Follow up cardiology, Dr. Mariah Milling  ? ?8. Controlled type 2 diabetes mellitus without complication, without long-term current use of insulin (HCC) ?Doing well on metformin.  ?- Hemoglobin A1c ? ?9. Insomnia, unspecified type ?try traZODone (DESYREL) 100 MG tablet; Take 0.5-1 tablets (50-100 mg total) by mouth at bedtime as needed for sleep.  Dispense: 30 tablet; Refill: 1 ? ?10. Prescription for Shingrix. Vaccine not administered in office.  ? ?- Zoster Vaccine Adjuvanted [facility]) injection; Inject 0.5 mLs into the muscle once for 1 dose. Repeat after 2 months  Dispense: 0.5 mL; Refill: 0 ? ?11. Prescription for Tdap. Vaccine not administered in office.  ? ?- Tdap (BOOSTRIX) 5-2.5-18.5 LF-MCG/0.5 injection; Inject 0.5 mLs into the muscle once for 1 dose.  Dispense: 0.5 mL; Refill: 0 ? ?12. Stopped smoking with greater than 20 pack year history ? ?- Ambulatory Referral Lung Cancer Screening [facility] ? ?  ? ?The entirety of the information documented in the History of Present Illness, Review of Systems and Physical Exam were personally obtained by me. Portions of this  information were initially documented by the CMA and reviewed by me for thoroughness and accuracy.   ? ? ?Mila Merry, MD  ?[facility] ?[phone] (phone) ?[phone] (fax) ? ?[facility]   ?

## 2021-05-09 LAB — PSA TOTAL (REFLEX TO FREE): Prostate Specific Ag, Serum: 3 ng/mL (ref 0.0–4.0)

## 2021-05-09 LAB — CBC
Hematocrit: 45.2 % (ref 37.5–51.0)
Hemoglobin: 15.3 g/dL (ref 13.0–17.7)
MCH: 30.4 pg (ref 26.6–33.0)
MCHC: 33.8 g/dL (ref 31.5–35.7)
MCV: 90 fL (ref 79–97)
Platelets: 339 10*3/uL (ref 150–450)
RBC: 5.03 x10E6/uL (ref 4.14–5.80)
RDW: 13.1 % (ref 11.6–15.4)
WBC: 7.2 10*3/uL (ref 3.4–10.8)

## 2021-05-09 LAB — COMPREHENSIVE METABOLIC PANEL
ALT: 80 IU/L — ABNORMAL HIGH (ref 0–44)
AST: 48 IU/L — ABNORMAL HIGH (ref 0–40)
Albumin/Globulin Ratio: 2 (ref 1.2–2.2)
Albumin: 4.8 g/dL (ref 3.8–4.8)
Alkaline Phosphatase: 71 IU/L (ref 44–121)
BUN/Creatinine Ratio: 10 (ref 10–24)
BUN: 14 mg/dL (ref 8–27)
Bilirubin Total: 0.6 mg/dL (ref 0.0–1.2)
CO2: 20 mmol/L (ref 20–29)
Calcium: 9.9 mg/dL (ref 8.6–10.2)
Chloride: 104 mmol/L (ref 96–106)
Creatinine, Ser: 1.41 mg/dL — ABNORMAL HIGH (ref 0.76–1.27)
Globulin, Total: 2.4 g/dL (ref 1.5–4.5)
Glucose: 115 mg/dL — ABNORMAL HIGH (ref 70–99)
Potassium: 5.1 mmol/L (ref 3.5–5.2)
Sodium: 142 mmol/L (ref 134–144)
Total Protein: 7.2 g/dL (ref 6.0–8.5)
eGFR: 57 mL/min/{1.73_m2} — ABNORMAL LOW (ref >59)

## 2021-05-09 LAB — LIPID PANEL
Chol/HDL Ratio: 4.5 ratio (ref 0.0–5.0)
Cholesterol, Total: 202 mg/dL — ABNORMAL HIGH (ref 100–199)
HDL: 45 mg/dL (ref >39)
LDL Chol Calc (NIH): 130 mg/dL — ABNORMAL HIGH (ref 0–99)
Triglycerides: 151 mg/dL — ABNORMAL HIGH (ref 0–149)
VLDL Cholesterol Cal: 27 mg/dL (ref 5–40)

## 2021-05-09 LAB — HEMOGLOBIN A1C
Est. average glucose Bld gHb Est-mCnc: 143 mg/dL
Hgb A1c MFr Bld: 6.6 % — ABNORMAL HIGH (ref 4.8–5.6)

## 2021-05-21 NOTE — Progress Notes (Signed)
? ? ? ?Annual Wellness Visit ? ?  ? ?Patient: Samuel Woods, Male    DOB: 17-Dec-1960, 61 y.o.   MRN: [id number] ?Visit Date: 05/08/2021 ? ?Today's Provider: Lelon Huh, MD  ? ?Chief Complaint  ?Patient presents with  ? Annual Exam  ? ?Subjective  ?  ?Samuel Woods is a 61 y.o. male who presents today for his Annual Wellness Visit. ? ?Medications: ?Outpatient Medications Prior to Visit  ?Medication Sig  ? ALPRAZolam (XANAX) 1 MG tablet TAKE 1/2 TO 1 (ONE-HALF TO ONE) TABLET BY MOUTH THREE TIMES DAILY AS NEEDED (Patient taking differently: One in the morning and two at bedtime)  ? amiodarone (PACERONE) 200 MG tablet Take 1 tablet by mouth once daily  ? apixaban (ELIQUIS) 5 MG TABS tablet Take 1 tablet (5 mg total) by mouth 2 (two) times daily.  ? atorvastatin (LIPITOR) 40 MG tablet TAKE 1 TABLET BY MOUTH ONCE DAILY IN THE EVENING  ? carvedilol (COREG) 12.5 MG tablet Take 2 tablets by mouth twice daily  ? cyclobenzaprine (FLEXERIL) 5 MG tablet TAKE 1 TABLET BY MOUTH EVERY 6 HOURS AS NEEDED FOR MUSCLE SPASM  ? ENTRESTO 97-103 MG Take 0.5 tablets by mouth 2 (two) times daily.  ? ibuprofen (ADVIL,MOTRIN) 200 MG tablet Take 400 mg by mouth every 8 (eight) hours as needed (for pain.).   ? metFORMIN (GLUCOPHAGE-XR) 500 MG 24 hr tablet Take 1 tablet by mouth once daily with breakfast  ? Multiple Vitamin (MULTIVITAMIN WITH MINERALS) TABS tablet Take 1 tablet by mouth daily. Centrum Silver Men 50+  ? torsemide (DEMADEX) 5 MG tablet Take 1-2 tablets (5-10 mg total) by mouth daily.  ? ?No facility-administered medications prior to visit.  ?  ?Allergies  ?Allergen Reactions  ? Dilaudid [Hydromorphone Hcl] Other (See Comments)  ?  According to patient's wife he had hallucinations after having dilaudid  ? ? ?Patient Care Team: ?Birdie Sons, MD as PCP - General (Family Medicine) ?Cathi Roan, RPH (Inactive) (Pharmacist) ?Minna Merritts, MD as Consulting Physician (Cardiology) ? ? ? ? ?  ? Objective  ? ? ?Most recent  functional status assessment: ? ?  05/08/2021  ?  1:55 PM  ?In your present state of health, do you have any difficulty performing the following activities:  ?Hearing? 0  ?Vision? 0  ?Difficulty concentrating or making decisions? 1  ?Walking or climbing stairs? 1  ?Dressing or bathing? 0  ?Doing errands, shopping? 0  ? ?Most recent fall risk assessment: ? ?  11/07/2020  ?  3:08 PM  ?Fall Risk   ?Falls in the past year? 1  ?Number falls in past yr: 1  ?Injury with Fall? 1  ?Risk for fall due to : History of fall(s);Impaired balance/gait  ?Follow up Falls evaluation completed  ? ? Most recent depression screenings: ? ?  05/08/2021  ?  1:54 PM 11/07/2020  ?  3:09 PM  ?PHQ 2/9 Scores  ?PHQ - 2 Score 2 3  ?PHQ- 9 Score 9 9  ? ?Most recent cognitive screening: ?   ? View : No data to display.  ?  ?  ?  ? ?Most recent Audit-C alcohol use screening ? ?  05/08/2021  ?  1:55 PM  ?Alcohol Use Disorder Test (AUDIT)  ?1. How often do you have a drink containing alcohol? 3  ?2. How many drinks containing alcohol do you have on a typical day when you are drinking? 0  ?3. How often do you have  six or more drinks on one occasion? 0  ?AUDIT-C Score 3  ?4. How often during the last year have you found that you were not able to stop drinking once you had started? 0  ?5. How often during the last year have you failed to do what was normally expected from you because of drinking? 0  ?6. How often during the last year have you needed a first drink in the morning to get yourself going after a heavy drinking session? 0  ?7. How often during the last year have you had a feeling of guilt of remorse after drinking? 0  ?8. How often during the last year have you been unable to remember what happened the night before because you had been drinking? 0  ?9. Have you or someone else been injured as a result of your drinking? 0  ?10. Has a relative or friend or a doctor or another health worker been concerned about your drinking or suggested you cut down?  0  ?Alcohol Use Disorder Identification Test Final Score (AUDIT) 3  ? ?A score of 3 or more in women, and 4 or more in men indicates increased risk for alcohol abuse, EXCEPT if all of the points are from question 1  ? ? ? Assessment & Plan  ?  ? ?Annual wellness visit done today including the all of the following: ?Reviewed patient's Family Medical History ?Reviewed and updated list of patient's medical providers ?Assessment of cognitive impairment was done ?Assessed patient's functional ability ?Established a written schedule for health screening services ?Health Risk Assessent Completed and Reviewed ? ?Exercise Activities and Dietary recommendations ? Goals   ?None ?  ? ? ?Immunization History  ?Administered Date(s) Administered  ? Influenza Inj Mdck Quad Pf 12/27/2016  ? Influenza Split 12/13/2005, 11/16/2009, 11/19/2011  ? Influenza,inj,Quad PF,6+ Mos 12/12/2014, 12/13/2015, 10/12/2018, 12/28/2019, 11/07/2020  ? Influenza-Unspecified 12/27/2016  ? Janssen (J&J) SARS-COV-2 Vaccination 07/24/2019  ? Moderna Sars-Covid-2 Vaccination 01/26/2020  ? Pneumococcal Polysaccharide-23 01/21/2009  ? Tdap 07/16/2007  ? ? ?Health Maintenance  ?Topic Date Due  ? FOOT EXAM  Never done  ? OPHTHALMOLOGY EXAM  Never done  ? HIV Screening  Never done  ? Zoster Vaccines- Shingrix (1 of 2) Never done  ? TETANUS/TDAP  07/15/2017  ? COVID-19 Vaccine (3 - Booster for Janssen series) 03/22/2020  ? INFLUENZA VACCINE  09/18/2021  ? HEMOGLOBIN A1C  11/08/2021  ? COLONOSCOPY (Pts 45-60yrs Insurance coverage will need to be confirmed)  05/05/2025  ? Hepatitis C Screening  Completed  ? HPV VACCINES  Aged Out  ? ? ? ?Discussed health benefits of physical activity, and encouraged him to engage in regular exercise appropriate for his age and condition.  ?  ? ?  ? ?The entirety of the information documented in the History of Present Illness, Review of Systems and Physical Exam were personally obtained by me. Portions of this information were  initially documented by the CMA and reviewed by me for thoroughness and accuracy.   ? ? ?Lelon Huh, MD  ?[facility] ?[phone] (phone) ?[phone] (fax) ? ?[facility]   ?

## 2021-05-30 ENCOUNTER — Other Ambulatory Visit: Payer: Self-pay | Admitting: Family Medicine

## 2021-05-30 DIAGNOSIS — I5022 Chronic systolic (congestive) heart failure: Secondary | ICD-10-CM

## 2021-05-30 DIAGNOSIS — I5189 Other ill-defined heart diseases: Secondary | ICD-10-CM

## 2021-06-15 ENCOUNTER — Other Ambulatory Visit: Payer: Self-pay | Admitting: Cardiovascular Disease

## 2021-06-15 NOTE — Telephone Encounter (Signed)
Attempted to schedule.  LMOV to call office.  ° °

## 2021-06-15 NOTE — Telephone Encounter (Signed)
Please schedule 12 month F/U for 90 day refills. Thank you! 

## 2021-06-18 ENCOUNTER — Telehealth: Payer: Self-pay | Admitting: Cardiovascular Disease

## 2021-06-18 NOTE — Telephone Encounter (Signed)
Pt called requesting Dr Mariah Milling review his labs from 05/08/2021 to see if he feels any changes are needed. ?

## 2021-06-19 MED ORDER — CARVEDILOL 25 MG PO TABS: 25.0000 mg | ORAL_TABLET | Freq: Two times a day (BID) | ORAL | 3 refills | Status: DC

## 2021-06-19 NOTE — Telephone Encounter (Signed)
Samuel Merritts, MD   ? ?No urgent need for medication changes, we can talk on next clinic visit  ?Thx  ?TG   ? ?Called and spoke with patient and explained that any medications changes could be made at next visit. Pt verbalized understanding.  ? ?Patient also stated that he needed a refill for her carvedilol. Carvedilol refill sent into [facility]. Pt voiced appreciation.  ?

## 2021-06-20 ENCOUNTER — Telehealth: Payer: Self-pay | Admitting: Cardiovascular Disease

## 2021-06-20 NOTE — Telephone Encounter (Signed)
*  STAT* If patient is at the pharmacy, call can be transferred to refill team.   1. Which medications need to be refilled? (please list name of each medication and dose if known)   amiodarone (PACERONE) 200 MG tablet   2. Which pharmacy/location (including street and city if local pharmacy) is medication to be sent to?  [id number] - [place], [place] - [address]    3. Do they need a 30 day or 90 day supply? 90  

## 2021-06-21 MED ORDER — AMIODARONE HCL 200 MG PO TABS: 200.0000 mg | ORAL_TABLET | Freq: Every day | ORAL | 0 refills | Status: DC

## 2021-06-21 NOTE — Telephone Encounter (Signed)
Requested Prescriptions   Signed Prescriptions Disp Refills  . amiodarone (PACERONE) 200 MG tablet 90 tablet 0    Sig: Take 1 tablet (200 mg total) by mouth daily.    Authorizing Provider: GOLLAN, TIMOTHY J    Ordering User: Margarete Horace C    

## 2021-06-29 ENCOUNTER — Other Ambulatory Visit: Payer: Self-pay | Admitting: Family Medicine

## 2021-06-29 DIAGNOSIS — F419 Anxiety disorder, unspecified: Secondary | ICD-10-CM

## 2021-07-29 NOTE — Progress Notes (Unsigned)
Cardiology Office Note  Date:  07/30/2021   ID:  Samuel Woods, DOB [birth year], MRN [id number]  PCP:  Malva Limes, MD   Chief Complaint  Patient presents with   12 month follow up     Patient c/o feeling exhausted, shortness of breath, tired and leg weakness after walking about 200 ft and has to sit down. Medications reviewed by the patient verbally.     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  Prior birth defect, limited mobility of ankle, gait instability Reports having some leg weakness, Does not get out much Afraid of falling, no recent falls  Xanax for anxiety  Has indicated Samuel Woods would like to do some volunteering, get out of the house to be more active, get some of Samuel Woods muscle strength back  EKG personally reviewed by myself on todays visit Sinus bradycardia rate 50 bpm nonspecific ST abnormality    Other past medical history reviewed s/p cardioversion in 2019 maintained in XR on amiodarone  echo in 2019 was 37%, improved to 50-55% 10/2018 on maintenance dose of Entresto.   history of pre-diabetes and recent a1c on 12/20/2019 was 6.9.   Echo by Dr. Welton Flakes in 2020: EF 50 to 55% Moderate to severe MR and TR Echocardiogram 2019, no significant mitral or tricuspid valve regurgitation  Has not worked at [employer] , out on disability  PMH:   has a past medical history of Afib (HCC), Dysrhythmia, History of chicken pox, History of lung abscess (01/18/2009), History of measles, Hyperlipidemia, and  Hypertension.  PSH:    Past Surgical History:  Procedure Laterality Date   CARDIOVERSION Right 11/17/2017   Procedure: CARDIOVERSION;  Surgeon: Laurier Nancy, MD;  Location: [facility] ORS;  Service: Cardiovascular;  Laterality: Right;   COLONOSCOPY WITH PROPOFOL N/A 05/05/2020   Procedure: COLONOSCOPY WITH PROPOFOL;  Surgeon: Wyline Mood, MD;  Location: [facility] ENDOSCOPY;  Service: Gastroenterology;  Laterality: N/A;   DOPPLER ECHOCARDIOGRAPHY  08/12/2007   Mild to moderate stenosis. Trace TR.Mild pulmonary hypertension. Borderline LVH. Mild depressed right systolic function. LVEF=50-55%   MITRAL VALVE REPAIR  05/2002   SPINAL FUSION     TEE WITHOUT CARDIOVERSION N/A 11/17/2017   Procedure: TRANSESOPHAGEAL ECHOCARDIOGRAM (TEE);  Surgeon: Laurier Nancy, MD;  Location: [facility] ORS;  Service: Cardiovascular;  Laterality: N/A;    Current Outpatient Medications  Medication Sig Dispense Refill   ALPRAZolam (XANAX) 1 MG tablet TAKE 1/2 TO 1 (ONE-HALF TO ONE) TABLET BY MOUTH THREE TIMES DAILY AS NEEDED 90 tablet 3   amiodarone (PACERONE) 200 MG tablet Take 1 tablet (200 mg total) by mouth daily. 90 tablet 0   apixaban (ELIQUIS) 5 MG TABS tablet Take 1 tablet (5 mg total) by mouth 2 (two) times daily. 180 tablet 4   atorvastatin (LIPITOR) 40 MG tablet TAKE 1 TABLET BY MOUTH ONCE DAILY IN THE EVENING 90 tablet 1   carvedilol (COREG) 25 MG tablet Take 1 tablet (25 mg total) by mouth 2 (two) times daily. 90 tablet 3   cyclobenzaprine (FLEXERIL) 5 MG tablet TAKE 1 TABLET BY MOUTH EVERY 6 HOURS AS  NEEDED FOR MUSCLE SPASM 60 tablet 3   ENTRESTO 97-103 MG Take 0.5 tablets by mouth 2 (two) times daily. 60 tablet 3   ibuprofen (ADVIL,MOTRIN) 200 MG tablet Take 400 mg by mouth every 8 (eight) hours as needed (for pain.).      metFORMIN (GLUCOPHAGE-XR) 500 MG 24 hr tablet Take 1 tablet by mouth once daily with breakfast 90 tablet 5   Multiple Vitamin (MULTIVITAMIN WITH MINERALS) TABS tablet Take 1 tablet by mouth  daily. Centrum Silver Men 50+     torsemide (DEMADEX) 5 MG tablet TAKE 1 TO 2 TABLETS BY MOUTH ONCE DAILY 180 tablet 4   traZODone (DESYREL) 100 MG tablet Take 0.5-1 tablets (50-100 mg total) by mouth at bedtime as needed for sleep. 30 tablet 1   No current facility-administered medications for this visit.     Allergies:   Dilaudid [hydromorphone hcl]   Social History:  The patient  reports that Samuel Woods quit smoking about 4 years ago. Samuel Woods smoking use included cigarettes. Samuel Woods has a 7.50 pack-year smoking history. Samuel Woods has never used smokeless tobacco. Samuel Woods reports current alcohol use. Samuel Woods reports that Samuel Woods does not use drugs.   Family History:   family history includes Alcohol abuse in Samuel Woods father; Cancer in an other family member; Hypertension in Samuel Woods brother.    Review of Systems: Review of Systems  Constitutional:  Positive for malaise/fatigue.  HENT: Negative.    Respiratory: Negative.    Cardiovascular: Negative.   Gastrointestinal: Negative.   Musculoskeletal: Negative.        Gait instability  Neurological:  Positive for dizziness.  Psychiatric/Behavioral: Negative.    All other systems reviewed and are negative.   PHYSICAL EXAM: VS:  BP 122/86 (BP Location: Left Arm, Patient Position: Sitting, Cuff Size: Normal)   Pulse (!) 50   Ht 6' (1.829 m)   Wt 179 lb 8 oz (81.4 kg)   SpO2 98%   BMI 24.34 kg/m  , BMI Body mass index is 24.34 kg/m. Constitutional:  oriented to person, place, and time. No distress.  HENT:  Head: Grossly normal Eyes:  no discharge. No scleral icterus.  Neck: No JVD, no carotid bruits  Cardiovascular: Regular rate and rhythm, 2/6 systolic ejection murmur appreciated left sternal border Pulmonary/Chest: Clear to auscultation bilaterally, no wheezes or rails Abdominal: Soft.  no distension.  no tenderness.  Musculoskeletal: Normal range of motion Neurological:  normal muscle tone. Coordination normal. No atrophy Skin: Skin warm and dry Psychiatric: normal  affect, pleasant  Recent Labs: 05/08/2021: ALT 80; BUN 14; Creatinine, Ser 1.41; Hemoglobin 15.3; Platelets 339; Potassium 5.1; Sodium 142    Lipid Panel Lab Results  Component Value Date   CHOL 202 (H) 05/08/2021   HDL 45 05/08/2021   LDLCALC 130 (H) 05/08/2021   TRIG 151 (H) 05/08/2021      Wt Readings from Last 3 Encounters:  07/30/21 179 lb 8 oz (81.4 kg)  05/08/21 184 lb (83.5 kg)  11/07/20 195 lb (88.5 kg)     ASSESSMENT AND PLAN:  Problem List Items Addressed This Visit       Cardiology Problems   Atrial fibrillation (HCC) - Primary   Aortic atherosclerosis (HCC)   Mitral regurgitation   HLD (hyperlipidemia)   Hypertension     Other   Diastolic dysfunction   Paroxysmal atrial fibrillation Maintaining normal sinus rhythm  continue low-dose amiodarone with carvedilol  Given the bradycardia and fatigue we will decrease the Coreg down to 12.5 twice daily  Dizziness Prior history of dizziness Decrease carvedilol as above Recommend Samuel Woods monitor blood pressure and orthostatics at home and call us with numbers or use MyChart  Mitral valve regurgitation/s/p MVR Moderate mitral valve regurgitation Last evaluated December 2021  Tricuspid valve regurgitation Was only mild on echocardiogram December 2021 through our system  Debility Previously working at [employer] Now relatively sedentary at home  Hyperlipidemia On Lipitor 40 daily, reports compliance CT coronary calcium score ordered for risk stratification, former smoker For elevated calcium score, could add Zetia 10 mg daily to achieve better cholesterol number   Total encounter time more than 30 minutes  Greater than 50% was spent in counseling and coordination of care with the patient    Signed, Dossie Arbourim Adora Yeh, M.D., Ph.D. [facility], [place] [phone]

## 2021-07-30 ENCOUNTER — Encounter: Payer: Self-pay | Admitting: Cardiovascular Disease

## 2021-07-30 ENCOUNTER — Ambulatory Visit: Payer: PPO | Admitting: Cardiovascular Disease

## 2021-07-30 VITALS — BP 122/86 | HR 50 | Ht 72.0 in | Wt 179.5 lb

## 2021-07-30 DIAGNOSIS — I5189 Other ill-defined heart diseases: Secondary | ICD-10-CM

## 2021-07-30 DIAGNOSIS — I700 Atherosclerosis of aorta: Secondary | ICD-10-CM | POA: Diagnosis not present

## 2021-07-30 DIAGNOSIS — I340 Nonrheumatic mitral (valve) insufficiency: Secondary | ICD-10-CM | POA: Diagnosis not present

## 2021-07-30 DIAGNOSIS — I4819 Other persistent atrial fibrillation: Secondary | ICD-10-CM | POA: Diagnosis not present

## 2021-07-30 DIAGNOSIS — I10 Essential (primary) hypertension: Secondary | ICD-10-CM | POA: Diagnosis not present

## 2021-07-30 DIAGNOSIS — E782 Mixed hyperlipidemia: Secondary | ICD-10-CM

## 2021-07-30 MED ORDER — CARVEDILOL 12.5 MG PO TABS: 12.5000 mg | ORAL_TABLET | Freq: Two times a day (BID) | ORAL | 3 refills | Status: DC

## 2021-07-30 MED ORDER — ATORVASTATIN CALCIUM 40 MG PO TABS: 40.0000 mg | ORAL_TABLET | Freq: Every day | ORAL | 3 refills | Status: DC

## 2021-07-30 MED ORDER — AMIODARONE HCL 200 MG PO TABS: 200.0000 mg | ORAL_TABLET | Freq: Every day | ORAL | 3 refills | Status: DC

## 2021-07-30 NOTE — Patient Instructions (Addendum)
Medication Instructions:  Please decrease the coreg down to 12.5 mg twice a day  Check sitting and standing blood pressures over the next few weeks  If you need a refill on your cardiac medications before your next appointment, please call your pharmacy.   Lab work: See if Dr. Sherrie Mustache will do a TSH  Testing/Procedures: We will order CT coronary calcium score  $99 at our [facility] in [place]  Please call Judeth Cornfield at [phone] to schedule   [facility] [address] [place], [place] [address]   Follow-Up: At [facility], you and your health needs are our priority.  As part of our continuing mission to provide you with exceptional heart care, we have created designated Provider Care Teams.  These Care Teams include your primary Cardiologist (physician) and Advanced Practice Providers (APPs -  Physician Assistants and Nurse Practitioners) who all work together to provide you with the care you need, when you need it.  You will need a follow up appointment in 6 months  Providers on your designated Care Team:   Nicolasa Ducking, NP Eula Listen, PA-C Cadence Fransico Michael, [place]  COVID-19 Vaccine Information can be found at: [email or web address] For questions related to vaccine distribution or appointments, please email [email or web address] or call 2671656204.

## 2021-08-20 ENCOUNTER — Other Ambulatory Visit: Payer: Self-pay | Admitting: Family Medicine

## 2021-08-20 DIAGNOSIS — G4700 Insomnia, unspecified: Secondary | ICD-10-CM

## 2021-08-27 ENCOUNTER — Ambulatory Visit
Admission: RE | Admit: 2021-08-27 | Discharge: 2021-08-27 | Disposition: A | Discharge status: Home / Self Care | Payer: PPO | Source: Ambulatory Visit | Attending: Cardiovascular Disease | Admitting: Cardiovascular Disease

## 2021-08-27 DIAGNOSIS — E782 Mixed hyperlipidemia: Secondary | ICD-10-CM | POA: Insufficient documentation

## 2021-08-29 ENCOUNTER — Telehealth: Payer: Self-pay | Admitting: Emergency Medicine

## 2021-08-29 MED ORDER — EZETIMIBE 10 MG PO TABS: 10.0000 mg | ORAL_TABLET | Freq: Every day | ORAL | 3 refills | Status: DC

## 2021-08-29 NOTE — Telephone Encounter (Signed)
Called patient. No answer. Detailed message left per DPR. Advised patient to call or send mychart message should he have questions or concerns. Results and comments have also been released to patient's mychart by MD

## 2021-08-29 NOTE — Telephone Encounter (Signed)
-----   Message from Antonieta Iba, MD sent at 08/29/2021  8:45 AM EDT ----- Cardiac CTA Calcium score close to 700, 90th percentile for his age, so this is high Would make sure he is taking his Lipitor 40, Would add Zetia 10 mg daily to achieve goal LDL less than 70, total cholesterol less than 150 If unable to get the numbers lower may need injection medicine Based on the results, no cardiac catheterization needed for severe stenosis there is just diffuse disease

## 2021-09-01 ENCOUNTER — Other Ambulatory Visit: Payer: Self-pay | Admitting: Cardiovascular Disease

## 2021-09-10 ENCOUNTER — Ambulatory Visit: Payer: PPO | Admitting: Family Medicine

## 2021-10-04 NOTE — Progress Notes (Signed)
Established patient visit  I,April Miller,acting as a scribe for Mila Merry, MD.,have documented all relevant documentation on the behalf of Mila Merry, MD,as directed by  Mila Merry, MD while in [name], MD.   Patient: Samuel Woods   DOB: Dec 31, 1960   61 y.o. Male  MRN: [id number] Visit Date: 10/05/2021  Today's healthcare provider: Mila Merry, MD   Chief Complaint  Patient presents with   Follow-up   Hyperlipidemia   Insomnia   Subjective    HPI  Lipid/Cholesterol, Follow-up  Last lipid panel Other pertinent labs  Lab Results  Component Value Date   CHOL 202 (H) 05/08/2021   HDL 45 05/08/2021   LDLCALC 130 (H) 05/08/2021   TRIG 151 (H) 05/08/2021   CHOLHDL 4.5 05/08/2021   Lab Results  Component Value Date   ALT 80 (H) 05/08/2021   AST 48 (H) 05/08/2021   PLT 339 05/08/2021   TSH 1.500 10/12/2018     He was last seen for this on 05/08/2021.   Management since that visit includes continue same medication.  He reports good compliance with treatment. He is not having side effects. none  Current diet: well balanced Current exercise: walking  He does state he cannot walk up hills because it makes his legs hurt.  The 10-year ASCVD risk score (Arnett DK, et al., 2019) is: 25.1%  Patient is noted to recently have had a CAC score over 300 which was ordered by Dr. Mariah Milling. He was started on ezetimibe in addition to atorvastatin in effort to get LDL < 70. Consideration of injectable cholesterol was also made. Patient states he is taking ezetimibe along with atorvastatin and tolerating very well.  ---------------------------------------------------------------------------------------------------   Follow up for insomnia:  The patient was last seen for this on 05/08/2021.   Changes made at last visit include; trying traZODone (DESYREL) 100 MG tablet; Take 0.5-1 tablets (50-100 mg total) by mouth at bedtime as needed for sleep.  He  reports good compliance with treatment. He feels that condition is Unchanged. He is not having side effects. none  -----------------------------------------------------------------------------------------   Diabetes Mellitus Type II, Follow-up  Lab Results  Component Value Date   HGBA1C 5.9 (A) 10/05/2021   HGBA1C 6.6 (H) 05/08/2021   HGBA1C 6.8 (A) 11/07/2020   Wt Readings from Last 3 Encounters:  10/05/21 179 lb (81.2 kg)  07/30/21 179 lb 8 oz (81.4 kg)  05/08/21 184 lb (83.5 kg)   Last seen for diabetes on 05/08/2021.   Management since then includes continuing Metformin. He reports good compliance with treatment. He is not having side effects. none   Home blood sugar records: fasting range: not checking  Episodes of hypoglycemia? No none   Current insulin regiment: n/a Most Recent Eye Exam: not UTD Current exercise: walking Current diet habits: well balanced  ---------------------------------------------------------------------------------------------------  He also complains of long standing lower back pain mostly in muscle area. Was prescribed cyclobenzaprine which he states was not very effective and made him very sleepy the next day.    Medications: Outpatient Medications Prior to Visit  Medication Sig   ALPRAZolam (XANAX) 1 MG tablet TAKE 1/2 TO 1 (ONE-HALF TO ONE) TABLET BY MOUTH THREE TIMES DAILY AS NEEDED   amiodarone (PACERONE) 200 MG tablet Take 1 tablet (200 mg total) by mouth daily.   apixaban (ELIQUIS) 5 MG TABS tablet Take 1 tablet (5 mg total) by mouth 2 (two) times daily.   atorvastatin (LIPITOR) 40 MG  tablet Take 1 tablet (40 mg total) by mouth daily.   carvedilol (COREG) 12.5 MG tablet Take 1 tablet (12.5 mg total) by mouth 2 (two) times daily.   cyclobenzaprine (FLEXERIL) 5 MG tablet TAKE 1 TABLET BY MOUTH EVERY 6 HOURS AS NEEDED FOR MUSCLE SPASM   ENTRESTO 97-103 MG Take 1/2 (one-half) tablet by mouth twice daily   ezetimibe (ZETIA) 10 MG tablet  Take 1 tablet (10 mg total) by mouth daily.   ibuprofen (ADVIL,MOTRIN) 200 MG tablet Take 400 mg by mouth every 8 (eight) hours as needed (for pain.).    metFORMIN (GLUCOPHAGE-XR) 500 MG 24 hr tablet Take 1 tablet by mouth once daily with breakfast   Multiple Vitamin (MULTIVITAMIN WITH MINERALS) TABS tablet Take 1 tablet by mouth daily. Centrum Silver Men 50+   torsemide (DEMADEX) 5 MG tablet TAKE 1 TO 2 TABLETS BY MOUTH ONCE DAILY   traZODone (DESYREL) 100 MG tablet TAKE 1/2 TO 1 (ONE-HALF TO ONE) TABLET BY MOUTH AT BEDTIME AS NEEDED FOR SLEEP   No facility-administered medications prior to visit.    Review of Systems  Constitutional:  Negative for appetite change, chills and fever.  Respiratory:  Negative for chest tightness, shortness of breath and wheezing.   Cardiovascular:  Negative for chest pain and palpitations.  Gastrointestinal:  Negative for abdominal pain, nausea and vomiting.       Objective    BP 135/88 (BP Location: Right Arm, Patient Position: Sitting, Cuff Size: Normal)   Resp 16   Wt 179 lb (81.2 kg)   SpO2 100%   BMI 24.28 kg/m    Physical Exam   General: Appearance:    Well developed, well nourished male in no acute distress  Eyes:    PERRL, conjunctiva/corneas clear, EOM's intact       Lungs:     Clear to auscultation bilaterally, respirations unlabored  Heart:    Normal heart rate. Regular rhythm.  3/6 blowing, holosystolic murmur at apex  Cap refill about 4 seconds with weak pedal pulses.   MS:   All extremities are intact.    Neurologic:   Awake, alert, oriented x 3. No apparent focal neurological defect.         Results for orders placed or performed in visit on 10/05/21  POCT glycosylated hemoglobin (Hb A1C)  Result Value Ref Range   Hemoglobin A1C 5.9 (A) 4.0 - 5.6 %   Est. average glucose Bld gHb Est-mCnc 123     Assessment & Plan     1. Controlled type 2 diabetes mellitus without complication, without long-term current use of insulin  (HCC) Very well controlled. Continue metformin  2. Persistent atrial fibrillation (HCC) Rhythm controlled on amiodarone. check TSH Strongly encouraged routine eye exam.  Follow up Dr. Mariah Milling as scheduled.   3. Mixed hyperlipidemia Is tolerating addition of ezetimibe to atorvastatin. Counseled on desire to get LDL at least below 70 in consideration of elevated  CAC and increased of future cardiac events. Dicussed injectable medications that Dr. Mariah Milling is considering.  - Comprehensive metabolic panel - Lipid panel   4. Intermittent claudication (HCC) Considering coronary artery disease is concerning of concomitant peripheral vascular disease.  Refer  [facility] for evaluation.    5. Chronic low back pain.  Back brace helps some. Cyclobenzaprine makes him sleepy the next day.  Will try methocarbamol 500mg  1-2 Q prn.   Addressed extensive list of chronic and acute medical problems today requiring 45 minutes reviewing his medical record,  counseling patient regarding his conditions and coordination of care.    Future Appointments  Date Time Provider Department Center  01/29/2022  4:00 PM Antonieta Iba, MD [facility]  02/22/2022  4:00 PM Sherrie Mustache Demetrios Isaacs, MD [facility]        The entirety of the information documented in the History of Present Illness, Review of Systems and Physical Exam were personally obtained by me. Portions of this information were initially documented by the CMA and reviewed by me for thoroughness and accuracy.     Mila Merry, MD  [facility] [phone] (phone) [phone] (fax)  [facility]

## 2021-10-05 ENCOUNTER — Ambulatory Visit (INDEPENDENT_AMBULATORY_CARE_PROVIDER_SITE_OTHER): Payer: PPO | Admitting: Family Medicine

## 2021-10-05 ENCOUNTER — Encounter: Payer: Self-pay | Admitting: Family Medicine

## 2021-10-05 VITALS — BP 135/88 | Resp 16 | Wt 179.0 lb

## 2021-10-05 DIAGNOSIS — I4819 Other persistent atrial fibrillation: Secondary | ICD-10-CM | POA: Diagnosis not present

## 2021-10-05 DIAGNOSIS — E782 Mixed hyperlipidemia: Secondary | ICD-10-CM | POA: Diagnosis not present

## 2021-10-05 DIAGNOSIS — I739 Peripheral vascular disease, unspecified: Secondary | ICD-10-CM | POA: Diagnosis not present

## 2021-10-05 DIAGNOSIS — G8929 Other chronic pain: Secondary | ICD-10-CM

## 2021-10-05 DIAGNOSIS — M545 Low back pain, unspecified: Secondary | ICD-10-CM

## 2021-10-05 DIAGNOSIS — E119 Type 2 diabetes mellitus without complications: Secondary | ICD-10-CM

## 2021-10-05 LAB — POCT GLYCOSYLATED HEMOGLOBIN (HGB A1C)
Est. average glucose Bld gHb Est-mCnc: 123
Hemoglobin A1C: 5.9 % — AB (ref 4.0–5.6)

## 2021-10-05 MED ORDER — METHOCARBAMOL 500 MG PO TABS: 500.0000 mg | ORAL_TABLET | Freq: Three times a day (TID) | ORAL | 2 refills | Status: DC | PRN

## 2021-10-05 NOTE — Patient Instructions (Signed)
.   Please review the attached list of medications and notify my office if there are any errors.   . Please bring all of your medications to every appointment so we can make sure that our medication list is the same as yours.   . Please contact your eyecare professional to schedule a routine eye exam    

## 2021-10-08 DIAGNOSIS — E782 Mixed hyperlipidemia: Secondary | ICD-10-CM | POA: Diagnosis not present

## 2021-10-08 DIAGNOSIS — I4819 Other persistent atrial fibrillation: Secondary | ICD-10-CM | POA: Diagnosis not present

## 2021-10-09 LAB — COMPREHENSIVE METABOLIC PANEL
ALT: 55 IU/L — ABNORMAL HIGH (ref 0–44)
AST: 34 IU/L (ref 0–40)
Albumin/Globulin Ratio: 2.1 (ref 1.2–2.2)
Albumin: 4.7 g/dL (ref 3.9–4.9)
Alkaline Phosphatase: 64 IU/L (ref 44–121)
BUN/Creatinine Ratio: 14 (ref 10–24)
BUN: 16 mg/dL (ref 8–27)
Bilirubin Total: 0.6 mg/dL (ref 0.0–1.2)
CO2: 22 mmol/L (ref 20–29)
Calcium: 9.6 mg/dL (ref 8.6–10.2)
Chloride: 103 mmol/L (ref 96–106)
Creatinine, Ser: 1.13 mg/dL (ref 0.76–1.27)
Globulin, Total: 2.2 g/dL (ref 1.5–4.5)
Glucose: 90 mg/dL (ref 70–99)
Potassium: 5 mmol/L (ref 3.5–5.2)
Sodium: 140 mmol/L (ref 134–144)
Total Protein: 6.9 g/dL (ref 6.0–8.5)
eGFR: 73 mL/min/{1.73_m2} (ref >59)

## 2021-10-09 LAB — LIPID PANEL
Chol/HDL Ratio: 3.3 ratio (ref 0.0–5.0)
Cholesterol, Total: 170 mg/dL (ref 100–199)
HDL: 51 mg/dL (ref >39)
LDL Chol Calc (NIH): 97 mg/dL (ref 0–99)
Triglycerides: 121 mg/dL (ref 0–149)
VLDL Cholesterol Cal: 22 mg/dL (ref 5–40)

## 2021-10-09 LAB — TSH: TSH: 0.945 u[IU]/mL (ref 0.450–4.500)

## 2021-11-15 ENCOUNTER — Other Ambulatory Visit: Payer: Self-pay | Admitting: Family Medicine

## 2021-11-15 DIAGNOSIS — G4700 Insomnia, unspecified: Secondary | ICD-10-CM

## 2021-11-19 ENCOUNTER — Other Ambulatory Visit (INDEPENDENT_AMBULATORY_CARE_PROVIDER_SITE_OTHER): Payer: Self-pay | Admitting: Nurse Practitioner

## 2021-11-19 DIAGNOSIS — I739 Peripheral vascular disease, unspecified: Secondary | ICD-10-CM

## 2021-11-21 ENCOUNTER — Encounter (INDEPENDENT_AMBULATORY_CARE_PROVIDER_SITE_OTHER): Payer: Self-pay | Admitting: Nurse Practitioner

## 2021-11-21 ENCOUNTER — Ambulatory Visit (INDEPENDENT_AMBULATORY_CARE_PROVIDER_SITE_OTHER): Payer: PPO | Admitting: Nurse Practitioner

## 2021-11-21 ENCOUNTER — Ambulatory Visit (INDEPENDENT_AMBULATORY_CARE_PROVIDER_SITE_OTHER): Payer: PPO

## 2021-11-21 VITALS — BP 151/76 | HR 60 | Resp 16 | Wt 176.6 lb

## 2021-11-21 DIAGNOSIS — I10 Essential (primary) hypertension: Secondary | ICD-10-CM

## 2021-11-21 DIAGNOSIS — I739 Peripheral vascular disease, unspecified: Secondary | ICD-10-CM

## 2021-11-21 DIAGNOSIS — E782 Mixed hyperlipidemia: Secondary | ICD-10-CM

## 2021-11-25 ENCOUNTER — Encounter (INDEPENDENT_AMBULATORY_CARE_PROVIDER_SITE_OTHER): Payer: Self-pay | Admitting: Nurse Practitioner

## 2021-11-25 NOTE — Progress Notes (Signed)
Subjective:    Patient ID: Samuel Woods, male    DOB: 06-Apr-1960, 61 y.o.   MRN: [id number] Chief Complaint  Patient presents with   New Patient (Initial Visit)    Ref Almyra Brace consult intermittent claudication     The patient is a 61 year old male who presents today as a referral from Dr. Sherrie Mustache in regards to concerns for claudication.  Patient has significant coronary artery disease including a previous mitral valve repair in 2004.  The patient endorses having some lower back issues for some time.  His pain is worse when he walks up inclines.  He also has some numbness and burning in his feet.  He denies any rest pain like symptoms.  There are currently no open wounds or ulcerations.  Today noninvasive studies show an ABI of 1.04 on the right and 1.09 on the left.  Largely triphasic in the bilateral tibial arteries with normal toe waveforms bilaterally.    Review of Systems  Musculoskeletal:  Positive for gait problem. Negative for back pain.  All other systems reviewed and are negative.      Objective:   Physical Exam Vitals reviewed.  HENT:     Head: Normocephalic.  Cardiovascular:     Rate and Rhythm: Normal rate.     Pulses: Normal pulses.  Pulmonary:     Effort: Pulmonary effort is normal.  Skin:    General: Skin is warm and dry.  Neurological:     Mental Status: He is alert and oriented to person, place, and time.     Motor: Weakness present.     Gait: Gait abnormal.  Psychiatric:        Mood and Affect: Mood normal.        Behavior: Behavior normal.        Thought Content: Thought content normal.        Judgment: Judgment normal.     BP (!) 151/76 (BP Location: Right Arm)   Pulse 60   Resp 16   Wt 176 lb 9.6 oz (80.1 kg)   BMI 23.95 kg/m   Past Medical History:  Diagnosis Date   Afib (HCC)    Dysrhythmia    History of chicken pox    History of lung abscess 01/18/2009   History of measles    Hyperlipidemia    Hypertension     Social History    Socioeconomic History   Marital status: Married    Spouse name: Not on file   Number of children: 2   Years of education: Not on file   Highest education level: Not on file  Occupational History   Occupation: Seasonal outside power equipment    Employer: [employer]  Tobacco Use   Smoking status: Former    Packs/day: 0.50    Years: 15.00    Total pack years: 7.50    Types: Cigarettes    Quit date: 07/19/2017    Years since quitting: 4.3   Smokeless tobacco: Never   Tobacco comments:    stopped smoking 10/2017.   Vaping Use   Vaping Use: Never used  Substance and Sexual Activity   Alcohol use: Yes    Alcohol/week: 0.0 standard drinks of alcohol    Comment: occasional use   Drug use: No   Sexual activity: Not on file  Other Topics Concern   Not on file  Social History Narrative   Not on file   Social Determinants of Health   Financial Resource Strain: Not  on file  Food Insecurity: Not on file  Transportation Needs: Not on file  Physical Activity: Not on file  Stress: Not on file  Social Connections: Not on file  Intimate Partner Violence: Not on file    Past Surgical History:  Procedure Laterality Date   CARDIOVERSION Right 11/17/2017   Procedure: CARDIOVERSION;  Surgeon: Laurier Nancy, MD;  Location: [facility] ORS;  Service: Cardiovascular;  Laterality: Right;   COLONOSCOPY WITH PROPOFOL N/A 05/05/2020   Procedure: COLONOSCOPY WITH PROPOFOL;  Surgeon: Wyline Mood, MD;  Location: [facility];  Service: Gastroenterology;  Laterality: N/A;   DOPPLER ECHOCARDIOGRAPHY  08/12/2007   Mild to moderate stenosis. Trace TR.Mild pulmonary hypertension. Borderline LVH. Mild depressed right systolic function. LVEF=50-55%   MITRAL VALVE REPAIR  05/2002   SPINAL FUSION     TEE WITHOUT CARDIOVERSION N/A 11/17/2017   Procedure: TRANSESOPHAGEAL ECHOCARDIOGRAM (TEE);  Surgeon: Laurier Nancy, MD;  Location: [facility] ORS;  Service: Cardiovascular;  Laterality: N/A;    Family  History  Problem Relation Age of Onset   Alcohol abuse Father    Hypertension Brother    Cancer Other    Diabetes Neg Hx     Allergies  Allergen Reactions   Dilaudid [Hydromorphone Hcl] Other (See Comments)    According to patient's wife he had hallucinations after having dilaudid       Latest Ref Rng & Units 05/08/2021    2:53 PM 09/26/2020    4:30 PM 12/20/2019    9:51 AM  CBC  WBC 3.4 - 10.8 x10E3/uL 7.2  9.1  8.5   Hemoglobin 13.0 - 17.7 g/dL 49.8  26.4  15.8   Hematocrit 37.5 - 51.0 % 45.2  43.7  41.2   Platelets 150 - 450 x10E3/uL 339  291  301       CMP     Component Value Date/Time   NA 140 10/08/2021 1505   K 5.0 10/08/2021 1505   CL 103 10/08/2021 1505   CO2 22 10/08/2021 1505   GLUCOSE 90 10/08/2021 1505   GLUCOSE 179 (H) 07/06/2017 1345   BUN 16 10/08/2021 1505   CREATININE 1.13 10/08/2021 1505   CALCIUM 9.6 10/08/2021 1505   PROT 6.9 10/08/2021 1505   ALBUMIN 4.7 10/08/2021 1505   AST 34 10/08/2021 1505   ALT 55 (H) 10/08/2021 1505   ALKPHOS 64 10/08/2021 1505   BILITOT 0.6 10/08/2021 1505   GFRNONAA 60 12/20/2019 0951   GFRAA 70 12/20/2019 0951     VAS [place] ABI WITH/WO TBI  Result Date: 11/22/2021  LOWER EXTREMITY DOPPLER STUDY Patient Name:  Samuel Woods  Date of Exam:   11/21/2021 Medical Rec #: [id number]      Accession #:    [id number] Date of Birth: Mar 11, 1960       Patient Gender: M Patient Age:   36 years Exam Location:  [facility] Procedure:      VAS [place] ABI WITH/WO TBI Referring Phys: --------------------------------------------------------------------------------  Indications: Neuropathy symptoms of feet  Performing Technologist: Salvadore Farber RVT  Examination Guidelines: A complete evaluation includes at minimum, Doppler waveform signals and systolic blood pressure reading at the level of bilateral brachial, anterior tibial, and posterior tibial arteries, when vessel segments are accessible. Bilateral testing is considered an integral  part of a complete examination. Photoelectric Plethysmograph (PPG) waveforms and toe systolic pressure readings are included as required and additional duplex testing as needed. Limited examinations for reoccurring indications may be performed as noted.  ABI  Findings: +---------+------------------+-----+---------+--------+ Right    Rt Pressure (mmHg)IndexWaveform Comment  +---------+------------------+-----+---------+--------+ Brachial 139                                      +---------+------------------+-----+---------+--------+ ATA      142               1.01 biphasic          +---------+------------------+-----+---------+--------+ PTA      146               1.04 triphasic         +---------+------------------+-----+---------+--------+ Great Toe147               1.05 Normal            +---------+------------------+-----+---------+--------+ +---------+------------------+-----+---------+-------+ Left     Lt Pressure (mmHg)IndexWaveform Comment +---------+------------------+-----+---------+-------+ Brachial 140                                     +---------+------------------+-----+---------+-------+ ATA      156               1.11 triphasic        +---------+------------------+-----+---------+-------+ PTA      153               1.09 triphasic        +---------+------------------+-----+---------+-------+ Great Toe146               1.04 Normal           +---------+------------------+-----+---------+-------+  Summary: Right: Resting right ankle-brachial index is within normal range. The right toe-brachial index is normal. Left: Resting left ankle-brachial index is within normal range. The left toe-brachial index is normal. *See table(s) above for measurements and observations.  Electronically signed by Festus Barren MD on 11/22/2021 at 2:03:26 PM.    Final        Assessment & Plan:   1. Claudication [facility]) Recommend:  The patient has atypical pain symptoms  for vascular disease and on exam I do not find evidence of vascular pathology that would explain the patient's symptoms.  Noninvasive studies do not identify significant vascular problems  I suspect the patient is c/o pseudoclaudication.  Patient should have an evaluation of the LS spine which I defer to the primary service.  The patient should continue walking and begin a more formal exercise program. The patient should continue  aggressive treatment of the lipid abnormalities.  Patient will follow-up with me on a PRN basis.   2. Primary hypertension Continue antihypertensive medications as already ordered, these medications have been reviewed and there are no changes at this time.   3. Mixed hyperlipidemia Continue statin as ordered and reviewed, no changes at this time    Current Outpatient Medications on File Prior to Visit  Medication Sig Dispense Refill   ALPRAZolam (XANAX) 1 MG tablet TAKE 1/2 TO 1 (ONE-HALF TO ONE) TABLET BY MOUTH THREE TIMES DAILY AS NEEDED 90 tablet 3   amiodarone (PACERONE) 200 MG tablet Take 1 tablet (200 mg total) by mouth daily. 90 tablet 3   apixaban (ELIQUIS) 5 MG TABS tablet Take 1 tablet (5 mg total) by mouth 2 (two) times daily. 180 tablet 4   atorvastatin (LIPITOR) 40 MG tablet Take 1 tablet (40 mg total) by mouth daily. 90 tablet 3   carvedilol (  COREG) 12.5 MG tablet Take 1 tablet (12.5 mg total) by mouth 2 (two) times daily. 180 tablet 3   ENTRESTO 97-103 MG Take 1/2 (one-half) tablet by mouth twice daily 90 tablet 0   ezetimibe (ZETIA) 10 MG tablet Take 1 tablet (10 mg total) by mouth daily. 90 tablet 3   ibuprofen (ADVIL,MOTRIN) 200 MG tablet Take 400 mg by mouth every 8 (eight) hours as needed (for pain.).      metFORMIN (GLUCOPHAGE-XR) 500 MG 24 hr tablet Take 1 tablet by mouth once daily with breakfast 90 tablet 5   methocarbamol (ROBAXIN) 500 MG tablet Take 1-2 tablets (500-1,000 mg total) by mouth every 8 (eight) hours as needed (back pain). 60  tablet 2   Multiple Vitamin (MULTIVITAMIN WITH MINERALS) TABS tablet Take 1 tablet by mouth daily. Centrum Silver Men 50+     torsemide (DEMADEX) 5 MG tablet TAKE 1 TO 2 TABLETS BY MOUTH ONCE DAILY 180 tablet 4   traZODone (DESYREL) 100 MG tablet TAKE 1/2 TO 1 (ONE-HALF TO ONE) TABLET BY MOUTH AT BEDTIME AS NEEDED FOR SLEEP 30 tablet 3   No current facility-administered medications on file prior to visit.    There are no Patient Instructions on file for this visit. No follow-ups on file.   Kris Hartmann, NP

## 2021-11-28 ENCOUNTER — Other Ambulatory Visit: Payer: Self-pay | Admitting: Family Medicine

## 2021-11-28 ENCOUNTER — Telehealth: Payer: Self-pay | Admitting: Family Medicine

## 2021-11-28 DIAGNOSIS — I480 Paroxysmal atrial fibrillation: Secondary | ICD-10-CM

## 2021-12-03 ENCOUNTER — Encounter: Payer: Self-pay | Admitting: *Deleted

## 2021-12-03 ENCOUNTER — Telehealth: Payer: Self-pay | Admitting: Family Medicine

## 2021-12-03 ENCOUNTER — Telehealth: Payer: Self-pay | Admitting: Cardiovascular Disease

## 2021-12-03 MED ORDER — ENTRESTO 49-51 MG PO TABS: 1.0000 | ORAL_TABLET | Freq: Two times a day (BID) | ORAL | 1 refills | Status: DC

## 2021-12-03 NOTE — Telephone Encounter (Signed)
I've got 3 boxes at my workstation he can have.

## 2021-12-03 NOTE — Telephone Encounter (Signed)
Pt states he is unable to afford  ELIQUIS 5 MG TABS tablet until his insurance converge starts in a month  Pt inquiring if office has samples available   Please fu w/ pt

## 2021-12-03 NOTE — Telephone Encounter (Signed)
I called and spoke with the patient.   He states he is currently in the donut hole with Entresto & Eliquis.  I confirmed with the patient that he has been receiving a 90 day supply of: Entresto 97/103 mg: - take 1/2 tablet twice daily   I discussed with the patient that options include: 1) I can pull 1 bottle of Entresto 49/51 mg tablets (2 weeks supply), but he will need to take 1 whole tablet BID.  2) We can print off paper work for patient assistance for him to complete and I can enclose this with his samples  3) Last resort would be to possibly come off Entresto and take losartan in it's place until the new year.   The patient is agreeable to picking up samples and an assistance application today.  He is aware this will be at our front desk for pick up.  Per the patient, Dr. Caryn Section is managing his Eliquis.   Samples Given: Entresto 49-51 mg Lot [id number] Exp: 08/2022 # 1 bottle   The patient states, he may also try to just pick up a 30 day supply of Entresto at the pharmacy in the interim as well.

## 2021-12-03 NOTE — Telephone Encounter (Signed)
Advised 

## 2021-12-03 NOTE — Telephone Encounter (Signed)
Patient calling the office for samples of medication:   1.  What medication and dosage are you requesting samples for?   ENTRESTO 97-103 MG    2.  Are you currently out of this medication? No he has a couples days left but he is in the donut hole with this medication.    Pt c/o medication issue:  1. Name of Medication:   ENTRESTO 97-103 MG    2. How are you currently taking this medication (dosage and times per day)?   3. Are you having a reaction (difficulty breathing--STAT)?   4. What is your medication issue? Pt wants to know if there is a generic brand he can take. He states everything will renew in January but right now, he cannot afford it

## 2021-12-11 ENCOUNTER — Other Ambulatory Visit: Payer: Self-pay | Admitting: Family Medicine

## 2021-12-11 DIAGNOSIS — F419 Anxiety disorder, unspecified: Secondary | ICD-10-CM

## 2021-12-14 NOTE — Telephone Encounter (Signed)
Application obtained from nurse bin for [facility]. Completed application faxed to [employer] at [phone]. Confirmation received.  Completed application placed in file cabinet for assistance.

## 2021-12-14 NOTE — Telephone Encounter (Signed)
Patient dropped off patient assistance forms to be completed Placed in nurse box  

## 2021-12-17 ENCOUNTER — Encounter (INDEPENDENT_AMBULATORY_CARE_PROVIDER_SITE_OTHER): Payer: Self-pay

## 2021-12-21 ENCOUNTER — Other Ambulatory Visit: Payer: Self-pay | Admitting: Family Medicine

## 2021-12-21 ENCOUNTER — Telehealth: Payer: Self-pay | Admitting: Cardiovascular Disease

## 2021-12-21 DIAGNOSIS — E119 Type 2 diabetes mellitus without complications: Secondary | ICD-10-CM

## 2021-12-21 NOTE — Telephone Encounter (Signed)
Last RF 10/30/20 #90 5 RF prescription written for 18 months worth  Requested Prescriptions  Refused Prescriptions Disp Refills   metFORMIN (GLUCOPHAGE-XR) 500 MG 24 hr tablet [Pharmacy Med Name: metFORMIN HCl ER 500 MG Oral Tablet Extended Release 24 Hour] 90 tablet 0    Sig: Take 1 tablet by mouth once daily with breakfast     Endocrinology:  Diabetes - Biguanides Failed - 12/21/2021  2:24 PM      Failed - B12 Level in normal range and within 720 days    No results found for: "VITAMINB12"       Failed - CBC within normal limits and completed in the last 12 months    WBC  Date Value Ref Range Status  05/08/2021 7.2 3.4 - 10.8 x10E3/uL Final  07/06/2017 9.4 3.8 - 10.6 K/uL Final   RBC  Date Value Ref Range Status  05/08/2021 5.03 4.14 - 5.80 x10E6/uL Final  07/06/2017 5.32 4.40 - 5.90 MIL/uL Final   Hemoglobin  Date Value Ref Range Status  05/08/2021 15.3 13.0 - 17.7 g/dL Final   Hematocrit  Date Value Ref Range Status  05/08/2021 45.2 37.5 - 51.0 % Final   MCHC  Date Value Ref Range Status  05/08/2021 33.8 31.5 - 35.7 g/dL Final  07/06/2017 33.4 32.0 - 36.0 g/dL Final   MCH  Date Value Ref Range Status  05/08/2021 30.4 26.6 - 33.0 pg Final  07/06/2017 31.2 26.0 - 34.0 pg Final   MCV  Date Value Ref Range Status  05/08/2021 90 79 - 97 fL Final   No results found for: "PLTCOUNTKUC", "LABPLAT", "POCPLA" RDW  Date Value Ref Range Status  05/08/2021 13.1 11.6 - 15.4 % Final         Passed - Cr in normal range and within 360 days    Creatinine, Ser  Date Value Ref Range Status  10/08/2021 1.13 0.76 - 1.27 mg/dL Final         Passed - HBA1C is between 0 and 7.9 and within 180 days    Hemoglobin A1C  Date Value Ref Range Status  10/05/2021 5.9 (A) 4.0 - 5.6 % Final   Hgb A1c MFr Bld  Date Value Ref Range Status  05/08/2021 6.6 (H) 4.8 - 5.6 % Final    Comment:             Prediabetes: 5.7 - 6.4          Diabetes: >6.4          Glycemic control for adults  with diabetes: <7.0          Passed - eGFR in normal range and within 360 days    GFR calc Af Amer  Date Value Ref Range Status  12/20/2019 70 >59 mL/min/1.73 Final    Comment:    **In accordance with recommendations from the NKF-ASN Task force,**   [facility] is in the process of updating its eGFR calculation to the   2021 CKD-EPI creatinine equation that estimates kidney function   without a race variable.    GFR calc non Af Amer  Date Value Ref Range Status  12/20/2019 60 >59 mL/min/1.73 Final   eGFR  Date Value Ref Range Status  10/08/2021 73 >59 mL/min/1.73 Final         Passed - Valid encounter within last 6 months    Recent Outpatient Visits           2 months ago Controlled type 2 diabetes mellitus without complication,   without long-term current use of insulin (HCC)   [facility] Fisher, Donald E, MD   7 months ago Annual physical exam   [facility] Fisher, Donald E, MD   1 year ago Controlled type 2 diabetes mellitus without complication, without long-term current use of insulin (HCC)   [facility] Fisher, Donald E, MD   1 year ago Dizziness   [facility] Fisher, Donald E, MD   1 year ago Type 2 diabetes mellitus without complication, without long-term current use of insulin (HCC)   [facility] Fisher, Donald E, MD       Future Appointments             In 1 month Gollan, Timothy J, MD [facility] [place]   In 2 months Fisher, Donald E, MD [facility]              

## 2021-12-21 NOTE — Telephone Encounter (Signed)
Fax received from [employer] stating the patient has been approved for [employer] assistance.   This is available to the patient until 02/18/23 at no cost.

## 2021-12-27 ENCOUNTER — Other Ambulatory Visit: Payer: Self-pay | Admitting: Family Medicine

## 2021-12-27 DIAGNOSIS — E119 Type 2 diabetes mellitus without complications: Secondary | ICD-10-CM

## 2021-12-27 NOTE — Telephone Encounter (Signed)
Unable to refill per protocol, Rx request is too soon, last refill 10/30/20 for 90 and 5 rf. Will refuse duplicate request.  Requested Prescriptions  Pending Prescriptions Disp Refills   metFORMIN (GLUCOPHAGE-XR) 500 MG 24 hr tablet [Pharmacy Med Name: metFORMIN HCl ER 500 MG Oral Tablet Extended Release 24 Hour] 90 tablet 0    Sig: Take 1 tablet by mouth once daily with breakfast     Endocrinology:  Diabetes - Biguanides Failed - 12/27/2021  2:45 PM      Failed - B12 Level in normal range and within 720 days    No results found for: "VITAMINB12"       Failed - CBC within normal limits and completed in the last 12 months    WBC  Date Value Ref Range Status  05/08/2021 7.2 3.4 - 10.8 x10E3/uL Final  07/06/2017 9.4 3.8 - 10.6 K/uL Final   RBC  Date Value Ref Range Status  05/08/2021 5.03 4.14 - 5.80 x10E6/uL Final  07/06/2017 5.32 4.40 - 5.90 MIL/uL Final   Hemoglobin  Date Value Ref Range Status  05/08/2021 15.3 13.0 - 17.7 g/dL Final   Hematocrit  Date Value Ref Range Status  05/08/2021 45.2 37.5 - 51.0 % Final   MCHC  Date Value Ref Range Status  05/08/2021 33.8 31.5 - 35.7 g/dL Final  07/06/2017 33.4 32.0 - 36.0 g/dL Final   [facility]  Date Value Ref Range Status  05/08/2021 30.4 26.6 - 33.0 pg Final  07/06/2017 31.2 26.0 - 34.0 pg Final   MCV  Date Value Ref Range Status  05/08/2021 90 79 - 97 fL Final   No results found for: "PLTCOUNTKUC", "LABPLAT", "POCPLA" RDW  Date Value Ref Range Status  05/08/2021 13.1 11.6 - 15.4 % Final         Passed - Cr in normal range and within 360 days    Creatinine, Ser  Date Value Ref Range Status  10/08/2021 1.13 0.76 - 1.27 mg/dL Final         Passed - HBA1C is between 0 and 7.9 and within 180 days    Hemoglobin A1C  Date Value Ref Range Status  10/05/2021 5.9 (A) 4.0 - 5.6 % Final   Hgb A1c MFr Bld  Date Value Ref Range Status  05/08/2021 6.6 (H) 4.8 - 5.6 % Final    Comment:             Prediabetes: 5.7 - 6.4           Diabetes: >6.4          Glycemic control for adults with diabetes: <7.0          Passed - eGFR in normal range and within 360 days    GFR calc Af Amer  Date Value Ref Range Status  12/20/2019 70 >59 mL/min/1.73 Final    Comment:    **In accordance with recommendations from the NKF-ASN Task force,**   [facility] is in the process of updating its eGFR calculation to the   2021 CKD-EPI creatinine equation that estimates kidney function   without a race variable.    GFR calc non Af Amer  Date Value Ref Range Status  12/20/2019 60 >59 mL/min/1.73 Final   eGFR  Date Value Ref Range Status  10/08/2021 73 >59 mL/min/1.73 Final         Passed - Valid encounter within last 6 months    Recent Outpatient Visits  2 months ago Controlled type 2 diabetes mellitus without complication, without long-term current use of insulin [facility])   [facility] Birdie Sons, MD   7 months ago Annual physical exam   [facility] Birdie Sons, MD   1 year ago Controlled type 2 diabetes mellitus without complication, without long-term current use of insulin [facility])   [facility] Birdie Sons, MD   1 year ago [place], Donald E, MD   1 year ago Type 2 diabetes mellitus without complication, without long-term current use of insulin [facility])   [place], Kirstie Peri, MD       Future Appointments             In 1 month Gollan, Kathlene November, MD [place]. [place]   In 1 month Fisher, Kirstie Peri, MD [facility], [facility]

## 2021-12-31 MED ORDER — METFORMIN HCL ER 500 MG PO TB24: 500.0000 mg | ORAL_TABLET | Freq: Every day | ORAL | 0 refills | Status: DC

## 2021-12-31 NOTE — Telephone Encounter (Signed)
Rx on file is from 10/2020- so not current - will send for RF per OV in August- to continue

## 2021-12-31 NOTE — Telephone Encounter (Signed)
Rx expired- new Rx sent Requested Prescriptions  Pending Prescriptions Disp Refills   metFORMIN (GLUCOPHAGE-XR) 500 MG 24 hr tablet 90 tablet 0    Sig: Take 1 tablet (500 mg total) by mouth daily with breakfast.     Endocrinology:  Diabetes - Biguanides Failed - 12/31/2021 12:04 PM      Failed - B12 Level in normal range and within 720 days    No results found for: "VITAMINB12"       Failed - CBC within normal limits and completed in the last 12 months    WBC  Date Value Ref Range Status  05/08/2021 7.2 3.4 - 10.8 x10E3/uL Final  07/06/2017 9.4 3.8 - 10.6 K/uL Final   RBC  Date Value Ref Range Status  05/08/2021 5.03 4.14 - 5.80 x10E6/uL Final  07/06/2017 5.32 4.40 - 5.90 MIL/uL Final   Hemoglobin  Date Value Ref Range Status  05/08/2021 15.3 13.0 - 17.7 g/dL Final   Hematocrit  Date Value Ref Range Status  05/08/2021 45.2 37.5 - 51.0 % Final   MCHC  Date Value Ref Range Status  05/08/2021 33.8 31.5 - 35.7 g/dL Final  07/06/2017 33.4 32.0 - 36.0 g/dL Final   [employer] [facility]  Date Value Ref Range Status  05/08/2021 30.4 26.6 - 33.0 pg Final  07/06/2017 31.2 26.0 - 34.0 pg Final   MCV  Date Value Ref Range Status  05/08/2021 90 79 - 97 fL Final   No results found for: "PLTCOUNTKUC", "LABPLAT", "POCPLA" RDW  Date Value Ref Range Status  05/08/2021 13.1 11.6 - 15.4 % Final         Passed - Cr in normal range and within 360 days    Creatinine, Ser  Date Value Ref Range Status  10/08/2021 1.13 0.76 - 1.27 mg/dL Final         Passed - HBA1C is between 0 and 7.9 and within 180 days    Hemoglobin A1C  Date Value Ref Range Status  10/05/2021 5.9 (A) 4.0 - 5.6 % Final   Hgb A1c MFr Bld  Date Value Ref Range Status  05/08/2021 6.6 (H) 4.8 - 5.6 % Final    Comment:             Prediabetes: 5.7 - 6.4          Diabetes: >6.4          Glycemic control for adults with diabetes: <7.0          Passed - eGFR in normal range and within 360 days    GFR calc Af Amer  Date Value  Ref Range Status  12/20/2019 70 >59 mL/min/1.73 Final    Comment:    **In accordance with recommendations from the NKF-ASN Task force,**   [facility] is in the process of updating its eGFR calculation to the   2021 CKD-EPI creatinine equation that estimates kidney function   without a race variable.    GFR calc non Af Amer  Date Value Ref Range Status  12/20/2019 60 >59 mL/min/1.73 Final   eGFR  Date Value Ref Range Status  10/08/2021 73 >59 mL/min/1.73 Final         Passed - Valid encounter within last 6 months    Recent Outpatient Visits           2 months ago Controlled type 2 diabetes mellitus without complication, without long-term current use of insulin [facility])   [place], MD   7 months ago  Annual physical exam   [facility] Birdie Sons, MD   1 year ago Controlled type 2 diabetes mellitus without complication, without long-term current use of insulin [facility])   [facility] Birdie Sons, MD   1 year ago [place], Donald E, MD   1 year ago Type 2 diabetes mellitus without complication, without long-term current use of insulin [facility])   [place], Kirstie Peri, MD       Future Appointments             In 4 weeks Gollan, Kathlene November, MD [place]   In 1 month Fisher, Kirstie Peri, MD [facility]            Refused Prescriptions Disp Refills   metFORMIN (GLUCOPHAGE-XR) 500 MG 24 hr tablet [Pharmacy Med Name: metFORMIN HCl ER 500 MG Oral Tablet Extended Release 24 Hour] 90 tablet 0    Sig: Take 1 tablet by mouth once daily with breakfast     Endocrinology:  Diabetes - Biguanides Failed - 12/31/2021 12:04 PM      Failed - B12 Level in normal range and within 720 days    No results found for: "VITAMINB12"       Failed - CBC within normal limits and completed in the last 12  months    WBC  Date Value Ref Range Status  05/08/2021 7.2 3.4 - 10.8 x10E3/uL Final  07/06/2017 9.4 3.8 - 10.6 K/uL Final   RBC  Date Value Ref Range Status  05/08/2021 5.03 4.14 - 5.80 x10E6/uL Final  07/06/2017 5.32 4.40 - 5.90 MIL/uL Final   Hemoglobin  Date Value Ref Range Status  05/08/2021 15.3 13.0 - 17.7 g/dL Final   Hematocrit  Date Value Ref Range Status  05/08/2021 45.2 37.5 - 51.0 % Final   MCHC  Date Value Ref Range Status  05/08/2021 33.8 31.5 - 35.7 g/dL Final  07/06/2017 33.4 32.0 - 36.0 g/dL Final   [facility]  Date Value Ref Range Status  05/08/2021 30.4 26.6 - 33.0 pg Final  07/06/2017 31.2 26.0 - 34.0 pg Final   MCV  Date Value Ref Range Status  05/08/2021 90 79 - 97 fL Final   No results found for: "PLTCOUNTKUC", "LABPLAT", "POCPLA" RDW  Date Value Ref Range Status  05/08/2021 13.1 11.6 - 15.4 % Final         Passed - Cr in normal range and within 360 days    Creatinine, Ser  Date Value Ref Range Status  10/08/2021 1.13 0.76 - 1.27 mg/dL Final         Passed - HBA1C is between 0 and 7.9 and within 180 days    Hemoglobin A1C  Date Value Ref Range Status  10/05/2021 5.9 (A) 4.0 - 5.6 % Final   Hgb A1c MFr Bld  Date Value Ref Range Status  05/08/2021 6.6 (H) 4.8 - 5.6 % Final    Comment:             Prediabetes: 5.7 - 6.4          Diabetes: >6.4          Glycemic control for adults with diabetes: <7.0          Passed - eGFR in normal range and within 360 days    GFR calc Af [place]  Date Value Ref Range Status  12/20/2019 70 >59 mL/min/1.73 Final    Comment:    **In accordance with recommendations from the NKF-ASN Task force,**   [facility] is in the process of updating its eGFR calculation to the   2021 CKD-EPI creatinine equation that estimates kidney function   without a race variable.    GFR calc non Af Amer  Date Value Ref Range Status  12/20/2019 60 >59 mL/min/1.73 Final   eGFR  Date Value Ref Range Status  10/08/2021 73 >59  mL/min/1.73 Final         Passed - Valid encounter within last 6 months    Recent Outpatient Visits           2 months ago Controlled type 2 diabetes mellitus without complication, without long-term current use of insulin ([place])   [facility] Birdie Sons, MD   7 months ago Annual physical exam   [facility] Birdie Sons, MD   1 year ago Controlled type 2 diabetes mellitus without complication, without long-term current use of insulin [facility])   [facility] [place] Birdie Sons, MD   1 year ago [place], Donald E, MD   1 year ago Type 2 diabetes mellitus without complication, without long-term current use of insulin [facility])   [place], Kirstie Peri, MD       Future Appointments             In 4 weeks Gollan, Kathlene November, MD [place]. [place]   In 1 month Fisher, Kirstie Peri, MD [facility], [facility]

## 2021-12-31 NOTE — Addendum Note (Signed)
Addended by: Elby Beck F on: 12/31/2021 12:04 PM   Modules accepted: Orders

## 2022-01-28 NOTE — Progress Notes (Signed)
Cardiology Office Note  Date:  01/29/2022   ID:  Trip, Cavanagh 1960/08/10, MRN [id number]  PCP:  Malva Limes, MD   Chief Complaint  Patient presents with   12 month follow up     Patient c/o dizziness when standing up too quickly & has shortness of breath & leg weakness when walking up an incline. Medications reviewed by the patient verbally.     HPI:  Mr. Navid Lenzen is a 61 year old gentleman with past medical history of Former smoker Hyperlipidemia Paroxysmal atrial fibrillation moderate mitral valve regurgitation and tricuspid valve regurgitation  MVR 2004 Prediabetes Lumbar fusion surgery/low back pain Coronary calcium score of 678. This was 91st percentile for age and sex matched control. Who presents for f/u of his atrial fibrillation and diastolic dysfunction, moderate mitral valve regurgitation  LOV June 2023 Dizzy getting up, orthostasis symptoms at home Little better on lower dose coreg 12.5 BID, orthostasis has still persisted Heart rate still low  Reports he is active, does ADLs and chores around the house No regular exercise or walking program He is retired from [employer]  Prior birth defect, limited mobility of ankle, gait instability  Xanax for anxiety  EKG personally reviewed by myself on todays visit Sinus bradycardia rate 50 bpm nonspecific ST abnormality   Other past medical history reviewed s/p cardioversion in 2019 maintained in XR on amiodarone  echo in 2019 was 37%, improved to 50-55% 10/2018 on maintenance dose of Entresto.   history of pre-diabetes and recent a1c on 12/20/2019 was 6.9.   Echo by Dr. Welton Flakes in 2020: EF 50 to 55% Moderate to severe MR and TR Echocardiogram 2019, no significant mitral or tricuspid valve regurgitation  Has not worked at [employer] , out on disability  PMH:   has a past medical history of Afib (HCC), Dysrhythmia, History of chicken pox, History of lung abscess (01/18/2009), History of measles,  Hyperlipidemia, and Hypertension.  PSH:    Past Surgical History:  Procedure Laterality Date   CARDIOVERSION Right 11/17/2017   Procedure: CARDIOVERSION;  Surgeon: Laurier Nancy, MD;  Location: [facility] ORS;  Service: Cardiovascular;  Laterality: Right;   COLONOSCOPY WITH PROPOFOL N/A 05/05/2020   Procedure: COLONOSCOPY WITH PROPOFOL;  Surgeon: Wyline Mood, MD;  [facility];  Service: Gastroenterology;  Laterality: N/A;   DOPPLER ECHOCARDIOGRAPHY  08/12/2007   Mild to moderate stenosis. Trace TR.Mild pulmonary hypertension. Borderline LVH. Mild depressed right systolic function. LVEF=50-55%   MITRAL VALVE REPAIR  05/2002   SPINAL FUSION     TEE WITHOUT CARDIOVERSION N/A 11/17/2017   Procedure: TRANSESOPHAGEAL ECHOCARDIOGRAM (TEE);  Surgeon: Laurier Nancy, MD;  Location: [facility] ORS;  Service: Cardiovascular;  Laterality: N/A;    Current Outpatient Medications  Medication Sig Dispense Refill   ALPRAZolam (XANAX) 1 MG tablet TAKE 1/2 TO 1 (ONE-HALF TO ONE) TABLET BY MOUTH THREE TIMES DAILY AS NEEDED 90 tablet 3   amiodarone (PACERONE) 200 MG tablet Take 1 tablet (200 mg total) by mouth daily. 90 tablet 3   atorvastatin (LIPITOR) 40 MG tablet Take 1 tablet (40 mg total) by mouth daily. 90 tablet 3   carvedilol (COREG) 12.5 MG tablet Take 1 tablet (12.5 mg total) by mouth 2 (two) times daily. 180 tablet 3   ELIQUIS 5 MG TABS tablet Take 1 tablet by mouth twice daily 180 tablet 4   ezetimibe (ZETIA) 10 MG tablet Take 1 tablet (10 mg total) by mouth daily. 90 tablet 3   ibuprofen (ADVIL,MOTRIN) 200 MG  tablet Take 400 mg by mouth every 8 (eight) hours as needed (for pain.).      metFORMIN (GLUCOPHAGE-XR) 500 MG 24 hr tablet Take 1 tablet (500 mg total) by mouth daily with breakfast. 90 tablet 0   methocarbamol (ROBAXIN) 500 MG tablet Take 1-2 tablets (500-1,000 mg total) by mouth every 8 (eight) hours as needed (back pain). 60 tablet 2   Multiple Vitamin (MULTIVITAMIN WITH MINERALS) TABS  tablet Take 1 tablet by mouth daily. Centrum Silver Men 50+     sacubitril-valsartan (ENTRESTO) 49-51 MG Take 1 tablet by mouth 2 (two) times daily. 180 tablet 1   torsemide (DEMADEX) 5 MG tablet TAKE 1 TO 2 TABLETS BY MOUTH ONCE DAILY 180 tablet 4   traZODone (DESYREL) 100 MG tablet TAKE 1/2 TO 1 (ONE-HALF TO ONE) TABLET BY MOUTH AT BEDTIME AS NEEDED FOR SLEEP 30 tablet 3   No current facility-administered medications for this visit.    Allergies:   Dilaudid [hydromorphone hcl]   Social History:  The patient  reports that he quit smoking about 4 years ago. His smoking use included cigarettes. He has a 7.50 pack-year smoking history. He has never used smokeless tobacco. He reports current alcohol use. He reports that he does not use drugs.   Family History:   family history includes Alcohol abuse in his father; Cancer in an other family member; Hypertension in his brother.    Review of Systems: Review of Systems  Constitutional:  Positive for malaise/fatigue.  HENT: Negative.    Respiratory: Negative.    Cardiovascular: Negative.   Gastrointestinal: Negative.   Musculoskeletal: Negative.        Gait instability  Neurological:  Positive for dizziness.  Psychiatric/Behavioral: Negative.    All other systems reviewed and are negative.   PHYSICAL EXAM: VS:  BP (!) 140/80 (BP Location: Left Arm, Patient Position: Sitting, Cuff Size: Normal)   Pulse (!) 50   Ht 6' (1.829 m)   Wt 173 lb (78.5 kg)   SpO2 98%   BMI 23.46 kg/m  , BMI Body mass index is 23.46 kg/m. Constitutional:  oriented to person, place, and time. No distress.  HENT:  Head: Grossly normal Eyes:  no discharge. No scleral icterus.  Neck: No JVD, no carotid bruits  Cardiovascular: Regular rate and rhythm, no murmurs appreciated Pulmonary/Chest: Clear to auscultation bilaterally, no wheezes or rails Abdominal: Soft.  no distension.  no tenderness.  Musculoskeletal: Normal range of motion Neurological:  normal  muscle tone. Coordination normal. No atrophy Skin: Skin warm and dry Psychiatric: normal affect, pleasant  Recent Labs: 05/08/2021: Hemoglobin 15.3; Platelets 339 10/08/2021: ALT 55; BUN 16; Creatinine, Ser 1.13; Potassium 5.0; Sodium 140; TSH 0.945    Lipid Panel Lab Results  Component Value Date   CHOL 170 10/08/2021   HDL 51 10/08/2021   LDLCALC 97 10/08/2021   TRIG 121 10/08/2021      Wt Readings from Last 3 Encounters:  01/29/22 173 lb (78.5 kg)  11/21/21 176 lb 9.6 oz (80.1 kg)  10/05/21 179 lb (81.2 kg)     ASSESSMENT AND PLAN:  Problem List Items Addressed This Visit       Cardiology Problems   Chronic systolic heart failure (HCC)   Atrial fibrillation (HCC) - Primary   Aortic atherosclerosis (HCC)   Mitral regurgitation   HLD (hyperlipidemia)   Hypertension     Other   Controlled type 2 diabetes mellitus without complication, without long-term current use of insulin (HCC)  Diastolic dysfunction  Paroxysmal atrial fibrillation Maintaining normal sinus rhythm  continue low-dose amiodarone with carvedilol  Recommend he decrease carvedilol down to 6.25 twice daily given his bradycardia and orthostasis symptoms  Dizziness Prior history of dizziness Reports having continued orthostasis symptoms despite negative orthostatic numbers on today's visit Low-dose carvedilol given bradycardia as above  Mitral valve regurgitation/s/p MVR Moderate mitral valve regurgitation Last evaluated December 2021 Consider repeat echocardiogram and follow-up  Tricuspid valve regurgitation Was only mild on echocardiogram December 2021 through our system  Debility Previously working at [employer]  recommend regular walking program for leg strengthening, conditioning  Elevated calcium scoring Denies anginal symptoms, goal LDL less than 70   Hyperlipidemia Stressed importance of staying on his Lipitor and Zetia Calcium score of 600   Total encounter time more than 30  minutes  Greater than 50% was spent in counseling and coordination of care with the patient    Signed, Esmond Plants, M.D., Ph.D. [place], [place]

## 2022-01-29 ENCOUNTER — Ambulatory Visit: Payer: PPO | Attending: Cardiovascular Disease | Admitting: Cardiovascular Disease

## 2022-01-29 ENCOUNTER — Encounter: Payer: Self-pay | Admitting: Cardiovascular Disease

## 2022-01-29 VITALS — BP 140/80 | HR 50 | Ht 72.0 in | Wt 173.0 lb

## 2022-01-29 DIAGNOSIS — I10 Essential (primary) hypertension: Secondary | ICD-10-CM | POA: Diagnosis not present

## 2022-01-29 DIAGNOSIS — E782 Mixed hyperlipidemia: Secondary | ICD-10-CM

## 2022-01-29 DIAGNOSIS — I340 Nonrheumatic mitral (valve) insufficiency: Secondary | ICD-10-CM | POA: Diagnosis not present

## 2022-01-29 DIAGNOSIS — E119 Type 2 diabetes mellitus without complications: Secondary | ICD-10-CM

## 2022-01-29 DIAGNOSIS — I4819 Other persistent atrial fibrillation: Secondary | ICD-10-CM | POA: Diagnosis not present

## 2022-01-29 DIAGNOSIS — I5022 Chronic systolic (congestive) heart failure: Secondary | ICD-10-CM

## 2022-01-29 DIAGNOSIS — I5189 Other ill-defined heart diseases: Secondary | ICD-10-CM

## 2022-01-29 DIAGNOSIS — I700 Atherosclerosis of aorta: Secondary | ICD-10-CM

## 2022-01-29 MED ORDER — EZETIMIBE 10 MG PO TABS: 10.0000 mg | ORAL_TABLET | Freq: Every day | ORAL | 3 refills | Status: DC

## 2022-01-29 MED ORDER — CARVEDILOL 6.25 MG PO TABS: 6.2500 mg | ORAL_TABLET | Freq: Two times a day (BID) | ORAL | 3 refills | Status: DC

## 2022-01-29 NOTE — Patient Instructions (Addendum)
Medication Instructions:  Please decrease the coreg down to 6.25 mg twice a day  If you need a refill on your cardiac medications before your next appointment, please call your pharmacy.   Lab work: No new labs needed  Testing/Procedures: No new testing needed  Follow-Up: At [facility], you and your health needs are our priority.  As part of our continuing mission to provide you with exceptional heart care, we have created designated Provider Care Teams.  These Care Teams include your primary Cardiologist (physician) and Advanced Practice Providers (APPs -  Physician Assistants and Nurse Practitioners) who all work together to provide you with the care you need, when you need it.  You will need a follow up appointment in 12 months  Providers on your designated Care Team:   Christopher Berge, NP Ryan Dunn, PA-C Cadence Furth, PA-C  COVID-19 Vaccine Information can be found at: [email or web address] For questions related to vaccine distribution or appointments, please email [email or web address] or call [phone].   

## 2022-01-30 ENCOUNTER — Ambulatory Visit: Payer: Self-pay | Admitting: *Deleted

## 2022-01-30 NOTE — Telephone Encounter (Signed)
Message from Areatha Keas sent at 01/30/2022  9:22 AM EST  Summary: medication clarity   Pt went to his cardiologist yesterday and they went over medications and they discovered ezetimibe (ZETIA) 10 MG tablet on his med list / pt wasn't sure what these are for and who prescribed them / we discovered that they were originally prescribed by Dr. Welton Flakes his previous cardiologist and his current cardiologist refilled them yesterday / pt wants to know if Dr. Sherrie Mustache wants him to continue to take theses and he asked what they are for / please advise          Call History   Type Contact Phone/Fax User  01/30/2022 09:22 AM EST Phone (Incoming) [place], Hands (Self) [phone] Rexene Edison) [place] Hough E   Reason for Disposition  Caller has medicine question only, adult not sick, AND triager answers question  Answer Assessment - Initial Assessment Questions 1. NAME of MEDICINE: "What medicine(s) are you calling about?"     Zetia 10 mg 2. QUESTION: "What is your question?" (e.g., double dose of medicine, side effect)     Am I supposed to be on that?   Dr. Mariah Milling sent that in for me yesterday.    Does Dr. Sherrie Mustache know I'm on this?   Dr. Mariah Milling, cardiologist wants you to stay on the Lipitor and Zetia per his office note from yesterday.  Pt. Agreeable to taking it.     "I hope I haven't been off of it too long".   "Maybe that's why Dr. Sherrie Mustache is having a hard time getting my cholesterol down".    He was agreeable to taking it to lower his cholesterol and thanked me for returning his call.  3. PRESCRIBER: "Who prescribed the medicine?" Reason: if prescribed by specialist, call should be referred to that group.     Dr. Julien Nordmann, cardiologist.   4. SYMPTOMS: "Do you have any symptoms?" If Yes, ask: "What symptoms are you having?"  "How bad are the symptoms (e.g., mild, moderate, severe)     Let him know it's for elevated cholesterol. 5. PREGNANCY:  "Is there any chance that you are pregnant?" "When was  your last menstrual period?"     N/A  Protocols used: Medication Question Call-A-AH

## 2022-01-30 NOTE — Telephone Encounter (Signed)
  Chief Complaint: Asking if he was supposed to be taking this and what was it for? Symptoms: Taking it for elevated cholesterol but not having symptoms Frequency: N/A Pertinent Negatives: Patient denies N/A Disposition: [] ED /[] Urgent Care (no appt availability in office) / [] Appointment(In office/virtual)/ []  [place] [facility]/ [x] Home Care/ [] Refused Recommended Disposition /[] [place] [facility]/ []  Follow-up with PCP Additional Notes: I answered his question from the office visit note from yesterday's appt. With Dr. , cardiologist.

## 2022-02-13 LAB — HM DIABETES EYE EXAM

## 2022-02-22 ENCOUNTER — Encounter: Payer: Self-pay | Admitting: Family Medicine

## 2022-02-22 ENCOUNTER — Ambulatory Visit (INDEPENDENT_AMBULATORY_CARE_PROVIDER_SITE_OTHER): Payer: PPO | Admitting: Family Medicine

## 2022-02-22 VITALS — BP 145/78 | HR 52 | Ht 72.0 in | Wt 168.0 lb

## 2022-02-22 DIAGNOSIS — I5189 Other ill-defined heart diseases: Secondary | ICD-10-CM

## 2022-02-22 DIAGNOSIS — E782 Mixed hyperlipidemia: Secondary | ICD-10-CM | POA: Diagnosis not present

## 2022-02-22 DIAGNOSIS — I4819 Other persistent atrial fibrillation: Secondary | ICD-10-CM

## 2022-02-22 DIAGNOSIS — M5441 Lumbago with sciatica, right side: Secondary | ICD-10-CM | POA: Diagnosis not present

## 2022-02-22 DIAGNOSIS — E119 Type 2 diabetes mellitus without complications: Secondary | ICD-10-CM

## 2022-02-22 DIAGNOSIS — M4716 Other spondylosis with myelopathy, lumbar region: Secondary | ICD-10-CM | POA: Diagnosis not present

## 2022-02-22 DIAGNOSIS — M545 Low back pain, unspecified: Secondary | ICD-10-CM

## 2022-02-22 DIAGNOSIS — I5022 Chronic systolic (congestive) heart failure: Secondary | ICD-10-CM

## 2022-02-22 DIAGNOSIS — I700 Atherosclerosis of aorta: Secondary | ICD-10-CM | POA: Diagnosis not present

## 2022-02-22 DIAGNOSIS — G8929 Other chronic pain: Secondary | ICD-10-CM | POA: Diagnosis not present

## 2022-02-22 MED ORDER — TIZANIDINE HCL 4 MG PO TABS: 4.0000 mg | ORAL_TABLET | Freq: Three times a day (TID) | ORAL | 0 refills | Status: DC | PRN

## 2022-02-22 MED ORDER — OXYCODONE-ACETAMINOPHEN 5-325 MG PO TABS: 1.0000 | ORAL_TABLET | Freq: Every day | ORAL | 0 refills | Status: DC | PRN

## 2022-02-22 NOTE — Progress Notes (Unsigned)
I,Sha'taria Tyson,acting as a Education administrator for Lelon Huh, MD.,have documented all relevant documentation on the behalf of Lelon Huh, MD,as directed by  Lelon Huh, MD while in the presence of Lelon Huh, MD.   Established patient visit   Patient: Samuel Woods   DOB: [birth year]   62 y.o. Male  MRN: [id number] Visit Date: 02/22/2022  Today's healthcare provider: Lelon Huh, MD   No chief complaint on file.  Subjective    HPI  Diabetes Mellitus Type II, Follow-up  Lab Results  Component Value Date   HGBA1C 5.9 (A) 10/05/2021   HGBA1C 6.6 (H) 05/08/2021   HGBA1C 6.8 (A) 11/07/2020   Wt Readings from Last 3 Encounters:  01/29/22 173 lb (78.5 kg)  11/21/21 176 lb 9.6 oz (80.1 kg)  10/05/21 179 lb (81.2 kg)   Last seen for diabetes 5 months ago.  Management since then includes continue metformin. He reports {excellent/good/fair/poor:19665} compliance with treatment. He {is/is not:21021397} having side effects. {document side effects if present:1} Symptoms: No fatigue No foot ulcerations  No appetite changes No nausea  Yes paresthesia of the feet  No polydipsia  No polyuria No visual disturbances   No vomiting     Home blood sugar records:  not being checked  Episodes of hypoglycemia? No    Current insulin regiment: {none} Most Recent Eye Exam: 02/03/22 (record request sent via fax)  Pertinent Labs: Lab Results  Component Value Date   CHOL 170 10/08/2021   HDL 51 10/08/2021   LDLCALC 97 10/08/2021   TRIG 121 10/08/2021   CHOLHDL 3.3 10/08/2021   Lab Results  Component Value Date   NA 140 10/08/2021   K 5.0 10/08/2021   CREATININE 1.13 10/08/2021   EGFR 73 10/08/2021     ---------------------------------------------------------------------------------------------------   Medications: Outpatient Medications Prior to Visit  Medication Sig   ALPRAZolam (XANAX) 1 MG tablet TAKE 1/2 TO 1 (ONE-HALF TO ONE) TABLET BY MOUTH THREE TIMES DAILY AS  NEEDED   amiodarone (PACERONE) 200 MG tablet Take 1 tablet (200 mg total) by mouth daily.   atorvastatin (LIPITOR) 40 MG tablet Take 1 tablet (40 mg total) by mouth daily.   carvedilol (COREG) 6.25 MG tablet Take 1 tablet (6.25 mg total) by mouth 2 (two) times daily.   ELIQUIS 5 MG TABS tablet Take 1 tablet by mouth twice daily   ezetimibe (ZETIA) 10 MG tablet Take 1 tablet (10 mg total) by mouth daily.   ibuprofen (ADVIL,MOTRIN) 200 MG tablet Take 400 mg by mouth every 8 (eight) hours as needed (for pain.).    metFORMIN (GLUCOPHAGE-XR) 500 MG 24 hr tablet Take 1 tablet (500 mg total) by mouth daily with breakfast.   methocarbamol (ROBAXIN) 500 MG tablet Take 1-2 tablets (500-1,000 mg total) by mouth every 8 (eight) hours as needed (back pain).   Multiple Vitamin (MULTIVITAMIN WITH MINERALS) TABS tablet Take 1 tablet by mouth daily. Centrum Silver Men 50+   sacubitril-valsartan (ENTRESTO) 49-51 MG Take 1 tablet by mouth 2 (two) times daily.   torsemide (DEMADEX) 5 MG tablet TAKE 1 TO 2 TABLETS BY MOUTH ONCE DAILY   traZODone (DESYREL) 100 MG tablet TAKE 1/2 TO 1 (ONE-HALF TO ONE) TABLET BY MOUTH AT BEDTIME AS NEEDED FOR SLEEP   No facility-administered medications prior to visit.    Review of Systems  {Labs  Heme  Chem  Endocrine  Serology  Results Review (optional):23779}   Objective    There were no vitals taken for this  visit. {Show previous vital signs (optional):23777}  Physical Exam  ***  No results found for any visits on 02/22/22.  Assessment & Plan     ***  No follow-ups on file.      {provider attestation***:1}   Lelon Huh, MD  [facility] [phone] (phone) [phone] (fax)  [place]

## 2022-02-23 LAB — LIPID PANEL
Chol/HDL Ratio: 2.6 ratio (ref 0.0–5.0)
Cholesterol, Total: 167 mg/dL (ref 100–199)
HDL: 64 mg/dL (ref >39)
LDL Chol Calc (NIH): 83 mg/dL (ref 0–99)
Triglycerides: 113 mg/dL (ref 0–149)
VLDL Cholesterol Cal: 20 mg/dL (ref 5–40)

## 2022-02-23 LAB — HEMOGLOBIN A1C
Est. average glucose Bld gHb Est-mCnc: 131 mg/dL
Hgb A1c MFr Bld: 6.2 % — ABNORMAL HIGH (ref 4.8–5.6)

## 2022-02-23 LAB — MICROALBUMIN / CREATININE URINE RATIO
Creatinine, Urine: 191.6 mg/dL
Microalb/Creat Ratio: 25 mg/g creat (ref 0–29)
Microalbumin, Urine: 48.2 ug/mL

## 2022-03-11 ENCOUNTER — Telehealth: Payer: Self-pay

## 2022-03-11 DIAGNOSIS — M5441 Lumbago with sciatica, right side: Secondary | ICD-10-CM

## 2022-03-11 NOTE — Telephone Encounter (Signed)
Copied from [place] [id number]. Topic: Referral - Request for Referral >> Mar 11, 2022  9:06 AM Ludger Nutting wrote: Has patient seen PCP for this complaint? Yes.   *If NO, is insurance requiring patient see PCP for this issue before PCP can refer them? Referral for which specialty: Orthopedic Preferred provider/office: Any Reason for referral: Back Pain

## 2022-03-13 NOTE — Telephone Encounter (Signed)
Pt has reached out again wanting a referral for his back pls fu at [phone].

## 2022-03-14 NOTE — Addendum Note (Signed)
Addended by: Birdie Sons on: 03/14/2022 09:13 AM   Modules accepted: Orders

## 2022-03-28 ENCOUNTER — Other Ambulatory Visit: Payer: Self-pay | Admitting: Family Medicine

## 2022-03-28 DIAGNOSIS — M4716 Other spondylosis with myelopathy, lumbar region: Secondary | ICD-10-CM

## 2022-03-28 DIAGNOSIS — E119 Type 2 diabetes mellitus without complications: Secondary | ICD-10-CM

## 2022-03-28 NOTE — Telephone Encounter (Signed)
Requested Prescriptions  Pending Prescriptions Disp Refills   tiZANidine (ZANAFLEX) 4 MG tablet [Pharmacy Med Name: tiZANidine HCl 4 MG Oral Tablet] 30 tablet     Sig: TAKE 1 TABLET BY MOUTH EVERY 8 HOURS AS NEEDED FOR MUSCLE SPASM     Not Delegated - Cardiovascular:  Alpha-2 Agonists - tizanidine Failed - 03/28/2022 10:06 AM      Failed - This refill cannot be delegated      Passed - Valid encounter within last 6 months    Recent Outpatient Visits           1 month ago Controlled type 2 diabetes mellitus without complication, without long-term current use of insulin ([place])   [place] Birdie Sons, MD   5 months ago Controlled type 2 diabetes mellitus without complication, without long-term current use of insulin ([place])   [place] Birdie Sons, MD   10 months ago Annual physical exam   [facility] Birdie Sons, MD   1 year ago Controlled type 2 diabetes mellitus without complication, without long-term current use of insulin ([place])   [place] Birdie Sons, MD   1 year ago [place] Birdie Sons, MD               metFORMIN (GLUCOPHAGE-XR) 500 MG 24 hr tablet [Pharmacy Med Name: metFORMIN HCl ER 500 MG Oral Tablet Extended Release 24 Hour] 90 tablet 0    Sig: Take 1 tablet by mouth once daily with breakfast     Endocrinology:  Diabetes - Biguanides Failed - 03/28/2022 10:06 AM      Failed - B12 Level in normal range and within 720 days    No results found for: "VITAMINB12"       Failed - CBC within normal limits and completed in the last 12 months    WBC  Date Value Ref Range Status  05/08/2021 7.2 3.4 - 10.8 x10E3/uL Final  07/06/2017 9.4 3.8 - 10.6 K/uL Final   RBC  Date Value Ref Range Status  05/08/2021 5.03 4.14 - 5.80 x10E6/uL Final  07/06/2017 5.32 4.40 - 5.90 MIL/uL Final   Hemoglobin  Date  Value Ref Range Status  05/08/2021 15.3 13.0 - 17.7 g/dL Final   Hematocrit  Date Value Ref Range Status  05/08/2021 45.2 37.5 - 51.0 % Final   MCHC  Date Value Ref Range Status  05/08/2021 33.8 31.5 - 35.7 g/dL Final  07/06/2017 33.4 32.0 - 36.0 g/dL Final   [facility]  Date Value Ref Range Status  05/08/2021 30.4 26.6 - 33.0 pg Final  07/06/2017 31.2 26.0 - 34.0 pg Final   MCV  Date Value Ref Range Status  05/08/2021 90 79 - 97 fL Final   No results found for: "PLTCOUNTKUC", "LABPLAT", "POCPLA" RDW  Date Value Ref Range Status  05/08/2021 13.1 11.6 - 15.4 % Final         Passed - Cr in normal range and within 360 days    Creatinine, Ser  Date Value Ref Range Status  10/08/2021 1.13 0.76 - 1.27 mg/dL Final         Passed - HBA1C is between 0 and 7.9 and within 180 days    Hgb A1c MFr Bld  Date Value Ref Range Status  02/22/2022 6.2 (H) 4.8 - 5.6 % Final    Comment:  Prediabetes: 5.7 - 6.4          Diabetes: >6.4          Glycemic control for adults with diabetes: <7.0          Passed - eGFR in normal range and within 360 days    GFR calc Af Amer  Date Value Ref Range Status  12/20/2019 70 >59 mL/min/1.73 Final    Comment:    **In accordance with recommendations from the NKF-ASN Task force,**   [facility] is in the process of updating its eGFR calculation to the   2021 CKD-EPI creatinine equation that estimates kidney function   without a race variable.    GFR calc non Af Amer  Date Value Ref Range Status  12/20/2019 60 >59 mL/min/1.73 Final   eGFR  Date Value Ref Range Status  10/08/2021 73 >59 mL/min/1.73 Final         Passed - Valid encounter within last 6 months    Recent Outpatient Visits           1 month ago Controlled type 2 diabetes mellitus without complication, without long-term current use of insulin ([place])   [place] Birdie Sons, MD   5 months ago Controlled type 2 diabetes mellitus without  complication, without long-term current use of insulin ([place])   [place] [facility] Birdie Sons, MD   10 months ago Annual physical exam   [facility] Birdie Sons, MD   1 year ago Controlled type 2 diabetes mellitus without complication, without long-term current use of insulin ([place])   [place] Birdie Sons, MD   1 year ago Dizziness   [facility] Birdie Sons, MD

## 2022-03-28 NOTE — Telephone Encounter (Signed)
Requested medication (s) are due for refill today: yes  Requested medication (s) are on the active medication list: yes  Last refill:  02/22/22 #30  Future visit scheduled: no  Notes to clinic:  med not delegated to NT to refill   Requested Prescriptions  Pending Prescriptions Disp Refills   tiZANidine (ZANAFLEX) 4 MG tablet [Pharmacy Med Name: tiZANidine HCl 4 MG Oral Tablet] 30 tablet     Sig: TAKE 1 TABLET BY MOUTH EVERY 8 HOURS AS NEEDED FOR MUSCLE SPASM     Not Delegated - Cardiovascular:  Alpha-2 Agonists - tizanidine Failed - 03/28/2022 10:06 AM      Failed - This refill cannot be delegated      Passed - Valid encounter within last 6 months    Recent Outpatient Visits           1 month ago Controlled type 2 diabetes mellitus without complication, without long-term current use of insulin ([place])   [place] Birdie Sons, MD   5 months ago Controlled type 2 diabetes mellitus without complication, without long-term current use of insulin ([place])   [place] Birdie Sons, MD   10 months ago Annual physical exam   [facility] Birdie Sons, MD   1 year ago Controlled type 2 diabetes mellitus without complication, without long-term current use of insulin ([place])   [place] Birdie Sons, MD   1 year ago [place] Birdie Sons, MD              Signed Prescriptions Disp Refills   metFORMIN (GLUCOPHAGE-XR) 500 MG 24 hr tablet 90 tablet 0    Sig: Take 1 tablet by mouth once daily with breakfast     Endocrinology:  Diabetes - Biguanides Failed - 03/28/2022 10:06 AM      Failed - B12 Level in normal range and within 720 days    No results found for: "VITAMINB12"       Failed - CBC within normal limits and completed in the last 12 months    WBC  Date Value Ref Range Status  05/08/2021 7.2 3.4 - 10.8 x10E3/uL  Final  07/06/2017 9.4 3.8 - 10.6 K/uL Final   RBC  Date Value Ref Range Status  05/08/2021 5.03 4.14 - 5.80 x10E6/uL Final  07/06/2017 5.32 4.40 - 5.90 MIL/uL Final   Hemoglobin  Date Value Ref Range Status  05/08/2021 15.3 13.0 - 17.7 g/dL Final   Hematocrit  Date Value Ref Range Status  05/08/2021 45.2 37.5 - 51.0 % Final   MCHC  Date Value Ref Range Status  05/08/2021 33.8 31.5 - 35.7 g/dL Final  07/06/2017 33.4 32.0 - 36.0 g/dL Final   [facility]  Date Value Ref Range Status  05/08/2021 30.4 26.6 - 33.0 pg Final  07/06/2017 31.2 26.0 - 34.0 pg Final   MCV  Date Value Ref Range Status  05/08/2021 90 79 - 97 fL Final   No results found for: "PLTCOUNTKUC", "LABPLAT", "POCPLA" RDW  Date Value Ref Range Status  05/08/2021 13.1 11.6 - 15.4 % Final         Passed - Cr in normal range and within 360 days    Creatinine, Ser  Date Value Ref Range Status  10/08/2021 1.13 0.76 - 1.27 mg/dL Final         Passed - HBA1C is between 0 and 7.9  and within 180 days    Hgb A1c MFr Bld  Date Value Ref Range Status  02/22/2022 6.2 (H) 4.8 - 5.6 % Final    Comment:             Prediabetes: 5.7 - 6.4          Diabetes: >6.4          Glycemic control for adults with diabetes: <7.0          Passed - eGFR in normal range and within 360 days    GFR calc Af Amer  Date Value Ref Range Status  12/20/2019 70 >59 mL/min/1.73 Final    Comment:    **In accordance with recommendations from the NKF-ASN Task force,**   [facility] is in the process of updating its eGFR calculation to the   2021 CKD-EPI creatinine equation that estimates kidney function   without a race variable.    GFR calc non Af Amer  Date Value Ref Range Status  12/20/2019 60 >59 mL/min/1.73 Final   eGFR  Date Value Ref Range Status  10/08/2021 73 >59 mL/min/1.73 Final         Passed - Valid encounter within last 6 months    Recent Outpatient Visits           1 month ago Controlled type 2 diabetes mellitus without  complication, without long-term current use of insulin ([place])   [place] Birdie Sons, MD   5 months ago Controlled type 2 diabetes mellitus without complication, without long-term current use of insulin ([place])   [place] [facility] Birdie Sons, MD   10 months ago Annual physical exam   [facility] Birdie Sons, MD   1 year ago Controlled type 2 diabetes mellitus without complication, without long-term current use of insulin ([place])   [place] Birdie Sons, MD   1 year ago Dizziness   [facility] Birdie Sons, MD

## 2022-03-29 DIAGNOSIS — M5416 Radiculopathy, lumbar region: Secondary | ICD-10-CM | POA: Diagnosis not present

## 2022-04-03 DIAGNOSIS — M533 Sacrococcygeal disorders, not elsewhere classified: Secondary | ICD-10-CM | POA: Diagnosis not present

## 2022-04-03 DIAGNOSIS — M5416 Radiculopathy, lumbar region: Secondary | ICD-10-CM | POA: Diagnosis not present

## 2022-04-04 ENCOUNTER — Other Ambulatory Visit: Payer: Self-pay | Admitting: Family Medicine

## 2022-04-04 DIAGNOSIS — M4716 Other spondylosis with myelopathy, lumbar region: Secondary | ICD-10-CM

## 2022-04-04 DIAGNOSIS — M5416 Radiculopathy, lumbar region: Secondary | ICD-10-CM | POA: Diagnosis not present

## 2022-04-10 ENCOUNTER — Telehealth: Payer: Self-pay | Admitting: Family Medicine

## 2022-04-10 NOTE — Telephone Encounter (Signed)
Contacted Sound[place] P[place] to schedule their annual wellness visit. Appointment made for 05/21/2022.  [place] Direct Dial: [phone]

## 2022-04-19 DIAGNOSIS — M5416 Radiculopathy, lumbar region: Secondary | ICD-10-CM | POA: Diagnosis not present

## 2022-05-02 ENCOUNTER — Telehealth: Payer: Self-pay

## 2022-05-02 DIAGNOSIS — E119 Type 2 diabetes mellitus without complications: Secondary | ICD-10-CM

## 2022-05-02 NOTE — Telephone Encounter (Signed)
Copied from [place] [phone]. Topic: Referral - Request for Referral >> May 02, 2022  1:21 PM Ludger Nutting wrote: Has patient seen PCP for this complaint? Yes.   *If NO, is insurance requiring patient see PCP for this issue before PCP can refer them? Referral for which specialty:  Preferred provider/office: [place] Reason for referral: Specialty foot care

## 2022-05-08 ENCOUNTER — Other Ambulatory Visit: Payer: Self-pay | Admitting: Family Medicine

## 2022-05-08 NOTE — Addendum Note (Signed)
Addended by: Birdie Sons on: 05/08/2022 08:16 AM   Modules accepted: Orders

## 2022-05-09 ENCOUNTER — Other Ambulatory Visit: Payer: Self-pay | Admitting: Family Medicine

## 2022-05-09 DIAGNOSIS — F419 Anxiety disorder, unspecified: Secondary | ICD-10-CM

## 2022-05-09 NOTE — Telephone Encounter (Signed)
Requested medication (s) are due for refill today - yes  Requested medication (s) are on the active medication list -yes  Future visit scheduled -yes  Last refill: 12/11/21 #90 3RF  Notes to clinic: non delegated Rx  Requested Prescriptions  Pending Prescriptions Disp Refills   ALPRAZolam (XANAX) 1 MG tablet [Pharmacy Med Name: ALPRAZolam 1 MG Oral Tablet] 90 tablet 0    Sig: TAKE 1/2 TO 1 (ONE-HALF TO ONE) TABLET BY MOUTH THREE TIMES DAILY AS NEEDED     Not Delegated - Psychiatry: Anxiolytics/Hypnotics 2 Failed - 05/09/2022  9:25 AM      Failed - This refill cannot be delegated      Failed - Urine Drug Screen completed in last 360 days      Passed - Patient is not pregnant      Passed - Valid encounter within last 6 months    Recent Outpatient Visits           2 months ago Controlled type 2 diabetes mellitus without complication, without long-term current use of insulin ([place])   [place] Birdie Sons, MD   7 months ago Controlled type 2 diabetes mellitus without complication, without long-term current use of insulin ([place])   [place] Birdie Sons, MD   1 year ago Annual physical exam   [place] Birdie Sons, MD   1 year ago Controlled type 2 diabetes mellitus without complication, without long-term current use of insulin ([place])   [place] Birdie Sons, MD   1 year ago [place], Donald E, MD                 Requested Prescriptions  Pending Prescriptions Disp Refills   ALPRAZolam (XANAX) 1 MG tablet [Pharmacy Med Name: ALPRAZolam 1 MG Oral Tablet] 90 tablet 0    Sig: TAKE 1/2 TO 1 (ONE-HALF TO ONE) TABLET BY MOUTH THREE TIMES DAILY AS NEEDED     Not Delegated - Psychiatry: Anxiolytics/Hypnotics 2 Failed - 05/09/2022  9:25 AM      Failed - This refill cannot be delegated      Failed -  Urine Drug Screen completed in last 360 days      Passed - Patient is not pregnant      Passed - Valid encounter within last 6 months    Recent Outpatient Visits           2 months ago Controlled type 2 diabetes mellitus without complication, without long-term current use of insulin ([place])   [place] Birdie Sons, MD   7 months ago Controlled type 2 diabetes mellitus without complication, without long-term current use of insulin ([place])   [place] [facility] Birdie Sons, MD   1 year ago Annual physical exam   [place] Birdie Sons, MD   1 year ago Controlled type 2 diabetes mellitus without complication, without long-term current use of insulin ([place])   [place] Birdie Sons, MD   1 year ago Dizziness   [facility] Birdie Sons, MD

## 2022-05-21 ENCOUNTER — Ambulatory Visit (INDEPENDENT_AMBULATORY_CARE_PROVIDER_SITE_OTHER): Payer: PPO

## 2022-05-21 VITALS — BP 126/78 | Ht 72.0 in | Wt 162.8 lb

## 2022-05-21 DIAGNOSIS — Z0000 Encounter for general adult medical examination without abnormal findings: Secondary | ICD-10-CM | POA: Diagnosis not present

## 2022-05-21 NOTE — Patient Instructions (Signed)
Samuel Woods , Thank you for taking time to come for your Medicare Wellness Visit. I appreciate your ongoing commitment to your health goals. Please review the following plan we discussed and let me know if I can assist you in the future.   These are the goals we discussed:  Goals   None     This is a list of the screening recommended for you and due dates:  Health Maintenance  Topic Date Due   Complete foot exam   Never done   Eye exam for diabetics  Never done   HIV Screening  Never done   Zoster (Shingles) Vaccine (1 of 2) Never done   Screening for Lung Cancer  05/24/2010   COVID-19 Vaccine (3 - 2023-24 season) 10/19/2021   Hemoglobin A1C  08/23/2022   Flu Shot  09/19/2022   Yearly kidney function blood test for diabetes  10/09/2022   Yearly kidney health urinalysis for diabetes  02/23/2023   Medicare Annual Wellness Visit  05/21/2023   Colon Cancer Screening  05/05/2025   DTaP/Tdap/Td vaccine (3 - Td or Tdap) 05/10/2031   Hepatitis C Screening: USPSTF Recommendation to screen - Ages 9-79 yo.  Completed   HPV Vaccine  Aged Out    Advanced directives: no  Conditions/risks identified: low falls risk  Next appointment: Follow up in one year for your annual wellness visit 05/27/2023 @3pm  in person  Preventive Care 40-64 Years, Male Preventive care refers to lifestyle choices and visits with your health care provider that can promote health and wellness. What does preventive care include? A yearly physical exam. This is also called an annual well check. Dental exams once or twice a year. Routine eye exams. Ask your health care provider how often you should have your eyes checked. Personal lifestyle choices, including: Daily care of your teeth and gums. Regular physical activity. Eating a healthy diet. Avoiding tobacco and drug use. Limiting alcohol use. Practicing safe sex. Taking low-dose aspirin every day starting at age 49. What happens during an annual well  check? The services and screenings done by your health care provider during your annual well check will depend on your age, overall health, lifestyle risk factors, and family history of disease. Counseling  Your health care provider may ask you questions about your: Alcohol use. Tobacco use. Drug use. Emotional well-being. Home and relationship well-being. Sexual activity. Eating habits. Work and work Statistician. Screening  You may have the following tests or measurements: Height, weight, and BMI. Blood pressure. Lipid and cholesterol levels. These may be checked every 5 years, or more frequently if you are over 41 years old. Skin check. Lung cancer screening. You may have this screening every year starting at age 4 if you have a 30-pack-year history of smoking and currently smoke or have quit within the past 15 years. Fecal occult blood test (FOBT) of the stool. You may have this test every year starting at age 42. Flexible sigmoidoscopy or colonoscopy. You may have a sigmoidoscopy every 5 years or a colonoscopy every 10 years starting at age 71. Prostate cancer screening. Recommendations will vary depending on your family history and other risks. Hepatitis C blood test. Hepatitis B blood test. Sexually transmitted disease (STD) testing. Diabetes screening. This is done by checking your blood sugar (glucose) after you have not eaten for a while (fasting). You may have this done every 1-3 years. Discuss your test results, treatment options, and if necessary, the need for more tests with your health care  provider. Vaccines  Your health care provider may recommend certain vaccines, such as: Influenza vaccine. This is recommended every year. Tetanus, diphtheria, and acellular pertussis (Tdap, Td) vaccine. You may need a Td booster every 10 years. Zoster vaccine. You may need this after age 67. Pneumococcal 13-valent conjugate (PCV13) vaccine. You may need this if you have certain  conditions and have not been vaccinated. Pneumococcal polysaccharide (PPSV23) vaccine. You may need one or two doses if you smoke cigarettes or if you have certain conditions. Talk to your health care provider about which screenings and vaccines you need and how often you need them. This information is not intended to replace advice given to you by your health care provider. Make sure you discuss any questions you have with your health care provider. Document Released: 03/03/2015 Document Revised: 10/25/2015 Document Reviewed: 12/06/2014 Elsevier Interactive Patient Education  2017 [place] Prevention in the Home Falls can cause injuries. They can happen to people of all ages. There are many things you can do to make your home safe and to help prevent falls. What can I do on the outside of my home? Regularly fix the edges of walkways and driveways and fix any cracks. Remove anything that might make you trip as you walk through a door, such as a raised step or threshold. Trim any bushes or trees on the path to your home. Use bright outdoor lighting. Clear any walking paths of anything that might make someone trip, such as rocks or tools. Regularly check to see if handrails are loose or broken. Make sure that both sides of any steps have handrails. Any raised decks and porches should have guardrails on the edges. Have any leaves, snow, or ice cleared regularly. Use sand or salt on walking paths during winter. Clean up any spills in your garage right away. This includes oil or grease spills. What can I do in the bathroom? Use night lights. Install grab bars by the toilet and in the tub and shower. Do not use towel bars as grab bars. Use non-skid mats or decals in the tub or shower. If you need to sit down in the shower, use a plastic, non-slip stool. Keep the floor dry. Clean up any water that spills on the floor as soon as it happens. Remove soap buildup in the tub or shower  regularly. Attach bath mats securely with double-sided non-slip rug tape. Do not have throw rugs and other things on the floor that can make you trip. What can I do in the bedroom? Use night lights. Make sure that you have a light by your bed that is easy to reach. Do not use any sheets or blankets that are too big for your bed. They should not hang down onto the floor. Have a firm chair that has side arms. You can use this for support while you get dressed. Do not have throw rugs and other things on the floor that can make you trip. What can I do in the kitchen? Clean up any spills right away. Avoid walking on wet floors. Keep items that you use a lot in easy-to-reach places. If you need to reach something above you, use a strong step stool that has a grab bar. Keep electrical cords out of the way. Do not use floor polish or wax that makes floors slippery. If you must use wax, use non-skid floor wax. Do not have throw rugs and other things on the floor that can make you trip.  What can I do with my stairs? Do not leave any items on the stairs. Make sure that there are handrails on both sides of the stairs and use them. Fix handrails that are broken or loose. Make sure that handrails are as long as the stairways. Check any carpeting to make sure that it is firmly attached to the stairs. Fix any carpet that is loose or worn. Avoid having throw rugs at the top or bottom of the stairs. If you do have throw rugs, attach them to the floor with carpet tape. Make sure that you have a light switch at the top of the stairs and the bottom of the stairs. If you do not have them, ask someone to add them for you. What else can I do to help prevent falls? Wear shoes that: Do not have high heels. Have rubber bottoms. Are comfortable and fit you well. Are closed at the toe. Do not wear sandals. If you use a stepladder: Make sure that it is fully opened. Do not climb a closed stepladder. Make sure that  both sides of the stepladder are locked into place. Ask someone to hold it for you, if possible. Clearly mark and make sure that you can see: Any grab bars or handrails. First and last steps. Where the edge of each step is. Use tools that help you move around (mobility aids) if they are needed. These include: Canes. Walkers. Scooters. Crutches. Turn on the lights when you go into a dark area. Replace any light bulbs as soon as they burn out. Set up your furniture so you have a clear path. Avoid moving your furniture around. If any of your floors are uneven, fix them. If there are any pets around you, be aware of where they are. Review your medicines with your doctor. Some medicines can make you feel dizzy. This can increase your chance of falling. Ask your doctor what other things that you can do to help prevent falls. This information is not intended to replace advice given to you by your health care provider. Make sure you discuss any questions you have with your health care provider. Document Released: 12/01/2008 Document Revised: 07/13/2015 Document Reviewed: 03/11/2014 Elsevier Interactive Patient Education  2017 [employer].

## 2022-05-21 NOTE — Progress Notes (Signed)
Subjective:   Samuel Woods is a 62 y.o. male who presents for Medicare Annual/Subsequent preventive examination.  Review of Systems    Cardiac Risk Factors include: advanced age (>38men, >21 women);diabetes mellitus;dyslipidemia;hypertension;male gender    Objective:    Today's Vitals   05/21/22 1539  Weight: 162 lb 12.8 oz (73.8 kg)  Height: 6' (1.829 m)   Body mass index is 22.08 kg/m.     05/21/2022    3:53 PM 05/05/2020    8:36 AM 11/17/2017    7:04 AM 07/06/2017    5:04 PM 07/06/2017    1:47 PM 10/06/2014    8:59 AM  Advanced Directives  Does Patient Have a Medical Advance Directive? No No Yes Yes No No  Type of Advance Directive   Living will Living will;Healthcare Power of Attorney    Does patient want to make changes to medical advance directive?   No - Patient declined No - Patient declined    Copy of [place] in Chart?    Yes    Would patient like information on creating a medical advance directive?      Yes - Educational materials given    Current Medications (verified) Outpatient Encounter Medications as of 05/21/2022  Medication Sig   ALPRAZolam (XANAX) 1 MG tablet TAKE 1/2 TO 1 (ONE-HALF TO ONE) TABLET BY MOUTH THREE TIMES DAILY AS NEEDED   amiodarone (PACERONE) 200 MG tablet Take 1 tablet (200 mg total) by mouth daily.   atorvastatin (LIPITOR) 40 MG tablet Take 1 tablet (40 mg total) by mouth daily.   carvedilol (COREG) 6.25 MG tablet Take 1 tablet (6.25 mg total) by mouth 2 (two) times daily.   ELIQUIS 5 MG TABS tablet Take 1 tablet by mouth twice daily   ezetimibe (ZETIA) 10 MG tablet Take 1 tablet (10 mg total) by mouth daily.   ibuprofen (ADVIL,MOTRIN) 200 MG tablet Take 400 mg by mouth every 8 (eight) hours as needed (for pain.).    metFORMIN (GLUCOPHAGE-XR) 500 MG 24 hr tablet Take 1 tablet by mouth once daily with breakfast   Multiple Vitamin (MULTIVITAMIN WITH MINERALS) TABS tablet Take 1 tablet by mouth daily. Centrum Silver Men 50+    oxyCODONE-acetaminophen (PERCOCET) 5-325 MG tablet Take 1 tablet by mouth daily as needed for severe pain.   sacubitril-valsartan (ENTRESTO) 49-51 MG Take 1 tablet by mouth 2 (two) times daily.   torsemide (DEMADEX) 5 MG tablet TAKE 1 TO 2 TABLETS BY MOUTH ONCE DAILY   tiZANidine (ZANAFLEX) 4 MG tablet TAKE 1 TABLET BY MOUTH EVERY 8 HOURS AS NEEDED FOR MUSCLE SPASM (Patient not taking: Reported on 05/21/2022)   traZODone (DESYREL) 100 MG tablet TAKE 1/2 TO 1 (ONE-HALF TO ONE) TABLET BY MOUTH AT BEDTIME AS NEEDED FOR SLEEP (Patient not taking: Reported on 05/21/2022)   No facility-administered encounter medications on file as of 05/21/2022.    Allergies (verified) Dilaudid [hydromorphone hcl]   History: Past Medical History:  Diagnosis Date   Afib    Dysrhythmia    History of chicken pox    History of lung abscess 01/18/2009   History of measles    Hyperlipidemia    Hypertension    Past Surgical History:  Procedure Laterality Date   CARDIOVERSION Right 11/17/2017   Procedure: CARDIOVERSION;  Surgeon: Dionisio David, MD;  Location: [facility] ORS;  Service: Cardiovascular;  Laterality: Right;   COLONOSCOPY WITH PROPOFOL N/A 05/05/2020   Procedure: COLONOSCOPY WITH PROPOFOL;  Surgeon: Jonathon Bellows, MD;  Location: [facility] ENDOSCOPY;  Service: Gastroenterology;  Laterality: N/A;   DOPPLER ECHOCARDIOGRAPHY  08/12/2007   Mild to moderate stenosis. Trace TR.Mild pulmonary hypertension. Borderline LVH. Mild depressed right systolic function. LVEF=50-55%   MITRAL VALVE REPAIR  05/2002   SPINAL FUSION     TEE WITHOUT CARDIOVERSION N/A 11/17/2017   Procedure: TRANSESOPHAGEAL ECHOCARDIOGRAM (TEE);  Surgeon: Dionisio David, MD;  Location: [facility] ORS;  Service: Cardiovascular;  Laterality: N/A;   Family History  Problem Relation Age of Onset   Alcohol abuse Father    Hypertension Brother    Cancer Other    Diabetes Neg Hx    Social History   Socioeconomic History   Marital status: Married    Spouse  name: Not on file   Number of children: 2   Years of education: Not on file   Highest education level: Not on file  Occupational History   Occupation: Seasonal outside power equipment    Employer: [employer]  Tobacco Use   Smoking status: Former    Packs/day: 0.50    Years: 15.00    Additional pack years: 0.00    Total pack years: 7.50    Types: Cigarettes    Quit date: 07/19/2017    Years since quitting: 4.8   Smokeless tobacco: Never   Tobacco comments:    stopped smoking 10/2017.   Vaping Use   Vaping Use: Never used  Substance and Sexual Activity   Alcohol use: Yes    Alcohol/week: 0.0 standard drinks of alcohol    Comment: occasional use   Drug use: No   Sexual activity: Not on file  Other Topics Concern   Not on file  Social History Narrative   Not on file   Social Determinants of Health   Financial Resource Strain: Low Risk  (05/21/2022)   Overall Financial Resource Strain (CARDIA)    Difficulty of Paying Living Expenses: Not very hard  Food Insecurity: No Food Insecurity (05/21/2022)   Hunger Vital Sign    Worried About Running Out of Food in the Last Year: Never true    Ran Out of Food in the Last Year: Never true  Transportation Needs: No Transportation Needs (05/21/2022)   PRAPARE - Hydrologist (Medical): No    Lack of Transportation (Non-Medical): No  Physical Activity: Sufficiently Active (05/21/2022)   Exercise Vital Sign    Days of Exercise per Week: 7 days    Minutes of Exercise per Session: 30 min  Stress: No Stress Concern Present (05/21/2022)   [place]    Feeling of Stress : Not at all  Social Connections: Moderately Isolated (05/21/2022)   Social Connection and Isolation Panel [NHANES]    Frequency of Communication with Friends and Family: More than three times a week    Frequency of Social Gatherings with Friends and Family: Twice a week     Attends Religious Services: Never    Marine scientist or Organizations: No    Attends Archivist Meetings: Never    Marital Status: Married    Tobacco Counseling Counseling given: Not Answered Tobacco comments: stopped smoking 10/2017.    Clinical Intake:  Pre-visit preparation completed: Yes  Pain : No/denies pain   BMI - recorded: 22.08 Nutritional Status: BMI of 19-24  Normal Nutritional Risks: None Diabetes: Yes CBG done?: No Did pt. bring in CBG monitor from home?: No  How often do you need  to have someone help you when you read instructions, pamphlets, or other written materials from your doctor or pharmacy?: 1 - Never  Diabetic?yes  Interpreter Needed?: No  Comments: lives with wife and son Information entered by :: B.Morrill Bomkamp,LPN   Activities of Daily Living    05/21/2022    3:56 PM 02/22/2022    4:22 PM  In your present state of health, do you have any difficulty performing the following activities:  Hearing? 0 0  Vision? 0 1  Difficulty concentrating or making decisions? 0 0  Walking or climbing stairs? 1 0  Dressing or bathing? 0 0  Doing errands, shopping? 0 0  Preparing Food and eating ? N   Using the Toilet? N   In the past six months, have you accidently leaked urine? N   Do you have problems with loss of bowel control? N   Managing your Medications? N   Managing your Finances? N   Housekeeping or managing your Housekeeping? N     Patient Care Team: Birdie Sons, MD as PCP - General (Family Medicine) Cathi Roan, [facility] (Inactive) (Pharmacist) Minna Merritts, MD as Consulting Physician (Cardiology) [facility], [place] any recent Medical Services you may have received from other than [facility] providers in the past year (date may be approximate).     Assessment:   This is a routine wellness examination for Aditya.  Hearing/Vision screen Hearing Screening - Comments:: Adequate hearing Vision Screening -  Comments:: Adequate vision w/glasses [facility]  Dietary issues and exercise activities discussed: Current Exercise Habits: Home exercise routine, Type of exercise: walking, Time (Minutes): 35, Frequency (Times/Week): 7, Weekly Exercise (Minutes/Week): 245, Intensity: Mild, Exercise limited by: orthopedic condition(s);cardiac condition(s)   Goals Addressed   None    Depression Screen    05/21/2022    3:46 PM 02/22/2022    4:22 PM 10/05/2021    4:10 PM 05/08/2021    1:54 PM 11/07/2020    3:09 PM 07/11/2020    4:18 PM 04/03/2020    3:50 PM  PHQ 2/9 Scores  PHQ - 2 Score 0 0 2 2 3 1 1   PHQ- 9 Score  0 5 9 9 5 5     Fall Risk    05/21/2022    3:43 PM 02/22/2022    4:21 PM 10/05/2021    4:10 PM 11/07/2020    3:08 PM 12/28/2019    3:50 PM  Fall Risk   Falls in the past year? 0 1 1 1 1   Number falls in past yr: 0 0 0 1 1  Injury with Fall? 0 0 0 1 0  Risk for fall due to : No Fall Risks History of fall(s) History of fall(s) History of fall(s);Impaired balance/gait History of fall(s)  Follow up Education provided;Falls prevention discussed Falls evaluation completed Falls evaluation completed Falls evaluation completed Falls prevention discussed    FALL RISK PREVENTION PERTAINING TO THE HOME:  Any stairs in or around the home? Yes  If so, are there any without handrails? Yes  Home free of loose throw rugs in walkways, pet beds, electrical cords, etc? Yes  Adequate lighting in your home to reduce risk of falls? Yes   ASSISTIVE DEVICES UTILIZED TO PREVENT FALLS:  Life alert? No  Use of a cane, walker or w/c? No  Grab bars in the bathroom? Yes  Shower chair or bench in shower? No  Elevated toilet seat or a handicapped toilet? No   TIMED  UP AND GO:  Was the test performed? Yes .  Length of time to ambulate 10 feet: 10 sec.   Gait slow and steady without use of assistive device  Cognitive Function:        05/21/2022    3:58 PM  6CIT Screen  What Year? 0 points  What month? 0  points  What time? 0 points  Count back from 20 0 points  Months in reverse 0 points  Repeat phrase 0 points  Total Score 0 points    Immunizations Immunization History  Administered Date(s) Administered   Influenza Inj Mdck Quad Pf 12/27/2016   Influenza Split 12/13/2005, 11/16/2009, 11/19/2011   Influenza,inj,Quad PF,6+ Mos 12/12/2014, 12/13/2015, 10/12/2018, 12/28/2019, 11/07/2020   Influenza-Unspecified 12/27/2016   Janssen (J&J) SARS-COV-2 Vaccination 07/24/2019   Moderna Sars-Covid-2 Vaccination 01/26/2020   Pneumococcal Polysaccharide-23 01/21/2009   Tdap 07/16/2007, 05/09/2021    TDAP status: Up to date  Flu Vaccine status: Up to date  Pneumococcal vaccine status: Up to date  Covid-19 vaccine status: Completed vaccines  Qualifies for Shingles Vaccine? Yes   Zostavax completed Yes   Shingrix Completed?: Yes  Screening Tests Health Maintenance  Topic Date Due   FOOT EXAM  Never done   OPHTHALMOLOGY EXAM  Never done   HIV Screening  Never done   Zoster Vaccines- Shingrix (1 of 2) Never done   Lung Cancer Screening  05/24/2010   COVID-19 Vaccine (3 - 2023-24 season) 10/19/2021   HEMOGLOBIN A1C  08/23/2022   INFLUENZA VACCINE  09/19/2022   Diabetic kidney evaluation - eGFR measurement  10/09/2022   Diabetic kidney evaluation - Urine ACR  02/23/2023   Medicare Annual Wellness (AWV)  05/21/2023   COLONOSCOPY (Pts 45-1yrs Insurance coverage will need to be confirmed)  05/05/2025   DTaP/Tdap/Td (3 - Td or Tdap) 05/10/2031   Hepatitis C Screening  Completed   HPV VACCINES  Aged Out    Health Maintenance  Health Maintenance Due  Topic Date Due   FOOT EXAM  Never done   OPHTHALMOLOGY EXAM  Never done   HIV Screening  Never done   Zoster Vaccines- Shingrix (1 of 2) Never done   Lung Cancer Screening  05/24/2010   COVID-19 Vaccine (3 - 2023-24 season) 10/19/2021    Colorectal cancer screening: Type of screening: Colonoscopy. Completed yes. Repeat every 5  years  Lung Cancer Screening: (Low Dose CT Chest recommended if Age 58-80 years, 30 pack-year currently smoking OR have quit w/in 15years.) does not qualify.   Lung Cancer Screening Referral: no  Additional Screening:  Hepatitis C Screening: does not qualify; Completed yes  Vision Screening: Recommended annual ophthalmology exams for early detection of glaucoma and other disorders of the eye. Is the patient up to date with their annual eye exam?  Yes  Who is the provider or what is the name of the office in which the patient attends annual eye exams? [facility] If pt is not established with a provider, would they like to be referred to a provider to establish care? No .   Dental Screening: Recommended annual dental exams for proper oral hygiene  Community Resource Referral / Chronic Care Management: CRR required this visit?  No   CCM required this visit?  No    Plan:     I have personally reviewed and noted the following in the patient's chart:   Medical and social history Use of alcohol, tobacco or illicit drugs  Current medications and supplements including opioid prescriptions.  Patient is currently taking opioid prescriptions. Information provided to patient regarding non-opioid alternatives. Patient advised to discuss non-opioid treatment plan with their provider. Functional ability and status Nutritional status Physical activity Advanced directives List of other physicians Hospitalizations, surgeries, and ER visits in previous 12 months Vitals Screenings to include cognitive, depression, and falls Referrals and appointments  In addition, I have reviewed and discussed with patient certain preventive protocols, quality metrics, and best practice recommendations. A written personalized care plan for preventive services as well as general preventive health recommendations were provided to patient.    Roger Shelter, LPN   [id number]   Nurse Notes: The patient states  they are doing well and has no concerns or questions at this time.  *Pt does relay that he stopped the trazodone and Tizanidine because they were not helping. Pt desires to return to flexeril rx.

## 2022-05-27 ENCOUNTER — Encounter: Payer: Self-pay | Admitting: Podiatry

## 2022-05-27 ENCOUNTER — Ambulatory Visit: Payer: PPO | Admitting: Podiatry

## 2022-05-27 ENCOUNTER — Ambulatory Visit (INDEPENDENT_AMBULATORY_CARE_PROVIDER_SITE_OTHER): Payer: PPO

## 2022-05-27 VITALS — BP 133/72 | HR 57

## 2022-05-27 DIAGNOSIS — M7661 Achilles tendinitis, right leg: Secondary | ICD-10-CM

## 2022-05-27 DIAGNOSIS — Z87768 Personal history of other specified (corrected) congenital malformations of integument, limbs and musculoskeletal system: Secondary | ICD-10-CM

## 2022-05-27 NOTE — Patient Instructions (Signed)
[email or web address] has the ability to order single shoe sizes for different sized feet

## 2022-05-28 ENCOUNTER — Encounter: Payer: Self-pay | Admitting: Podiatry

## 2022-05-28 NOTE — Progress Notes (Signed)
  Subjective:  Patient ID: Samuel Woods, male    DOB: 05-Nov-1960,  MRN: [id number]  Chief Complaint  Patient presents with   Foot Pain    "My heel hurts in the back of my right foot.  I have a club foot and one foot is much bigger than the other." N - heel pain L - posterior right  D - 2 years O - gradually worse C - very tender A - [employer] shoes T - liquid skin, ankle brace, sticky wrap on tape    62 y.o. male presents with the above complaint. History confirmed with patient.  He had clubfoot correction when he was a child, had bracing in his Achilles surgery.  Has a spot on the back of the heel that rubs and causes a scab  Objective:  Physical Exam: warm, good capillary refill, no trophic changes or ulcerative lesions, normal DP and PT pulses, normal sensory exam, and minimal range of motion of ankle, right foot is much smaller than the left foot, Achilles tendon is calcified and hard, small scab here, no signs infection no drainage or ulceration   Radiographs: Multiple views x-ray of the right foot: Significant calcifications of the Achilles tendon Assessment:   1. Achilles tendinitis of right lower extremity   2. History of clubfoot correction      Plan:  Patient was evaluated and treated and all questions answered.  Reviewed his radiographs.  Discussed that the calcifications of the tendon are causing pressure on shoe gear.  We discussed offloading with exterior silicone padding is much as possible.  We also discussed appropriate shoe gear.  Ideally would like to have something that does not rub on this area.  He requires 2 different size shoes due to the atrophy of his right foot from previous correction.  From a position standpoint his foot is in fairly rectus positioning.  Discussed he can order separate/shoes rooms [email or web address].  I think with his significant cardiac history that excision of the calcifications would make him a poor elective surgical candidate if we can  appropriately offload.  There are no signs of infection.  Return to see me as needed if it worsens or does not improve  No follow-ups on file.

## 2022-06-05 ENCOUNTER — Other Ambulatory Visit: Payer: Self-pay | Admitting: Family Medicine

## 2022-06-05 DIAGNOSIS — I5189 Other ill-defined heart diseases: Secondary | ICD-10-CM

## 2022-06-05 DIAGNOSIS — I5022 Chronic systolic (congestive) heart failure: Secondary | ICD-10-CM

## 2022-06-06 NOTE — Telephone Encounter (Signed)
Requested medication (s) are due for refill today: yes  Requested medication (s) are on the active medication list: yes  Last refill:  412/23 #180/4  Future visit scheduled: no  Notes to clinic:  rx expired and unable to refill per protocol due to failed labs, no updated results.      Requested Prescriptions  Pending Prescriptions Disp Refills   torsemide (DEMADEX) 5 MG tablet [Pharmacy Med Name: Torsemide 5 MG Oral Tablet] 180 tablet 0    Sig: TAKE 1 TO 2 TABLETS BY MOUTH ONCE DAILY     Cardiovascular:  Diuretics - Loop Failed - 06/05/2022 12:41 PM      Failed - K in normal range and within 180 days    Potassium  Date Value Ref Range Status  10/08/2021 5.0 3.5 - 5.2 mmol/L Final         Failed - Ca in normal range and within 180 days    Calcium  Date Value Ref Range Status  10/08/2021 9.6 8.6 - 10.2 mg/dL Final         Failed - Na in normal range and within 180 days    Sodium  Date Value Ref Range Status  10/08/2021 140 134 - 144 mmol/L Final         Failed - Cr in normal range and within 180 days    Creatinine, Ser  Date Value Ref Range Status  10/08/2021 1.13 0.76 - 1.27 mg/dL Final         Failed - Cl in normal range and within 180 days    Chloride  Date Value Ref Range Status  10/08/2021 103 96 - 106 mmol/L Final         Failed - Mg Level in normal range and within 180 days    No results found for: "MG"       Passed - Last BP in normal range    BP Readings from Last 1 Encounters:  05/27/22 133/72         Passed - Valid encounter within last 6 months    Recent Outpatient Visits           3 months ago Controlled type 2 diabetes mellitus without complication, without long-term current use of insulin (HCC)   [place] [facility] Malva Limes, MD   8 months ago Controlled type 2 diabetes mellitus without complication, without long-term current use of insulin (HCC)   [place] [facility] Malva Limes, MD   1  year ago Annual physical exam   [facility] Limes, [facility]   1 year ago Controlled type 2 diabetes mellitus without complication, without long-term current use of insulin (HCC)   [place] [facility] Malva Limes, MD   1 year ago Dizziness   [facility] Malva Limes, MD

## 2022-06-24 ENCOUNTER — Other Ambulatory Visit: Payer: Self-pay | Admitting: Family Medicine

## 2022-06-24 DIAGNOSIS — E119 Type 2 diabetes mellitus without complications: Secondary | ICD-10-CM

## 2022-07-19 ENCOUNTER — Other Ambulatory Visit: Payer: Self-pay | Admitting: Family Medicine

## 2022-07-19 DIAGNOSIS — F419 Anxiety disorder, unspecified: Secondary | ICD-10-CM

## 2022-08-12 ENCOUNTER — Other Ambulatory Visit: Payer: Self-pay | Admitting: Cardiovascular Disease

## 2022-08-19 ENCOUNTER — Ambulatory Visit: Payer: Self-pay | Admitting: *Deleted

## 2022-08-19 NOTE — Telephone Encounter (Signed)
  Chief Complaint: lower back pain and stiffness. Symptoms: Has radiation done one of his legs.   Frequency: Chronic problem.   Has had 2 shots in his back that helped get rid of the sharp pain he was having a couple of months ago. Pertinent Negatives: Patient denies injuries.  Not sure what has gotten his back stiff and painful again. Disposition: [] ED /[] Urgent Care (no appt availability in office) / [x] Appointment(In office/virtual)/ []  [place] [facility]/ [] Home Care/ [] Refused Recommended Disposition /[] [place] [facility]/ []  Follow-up with PCP Additional Notes: He only wanted to see Dr Sherrie Mustache so an appt was made for 08/30/2022 at 3:00.

## 2022-08-19 NOTE — Telephone Encounter (Signed)
Reason for Disposition  [1] Pain radiates into the thigh or further down the leg AND [2] one leg  Answer Assessment - Initial Assessment Questions 1. ONSET: "When did the pain begin?"      Back pain.   Dr Sherrie Mustache sent me to ortho.    Dr. Sherrie Mustache took me off of muscle relaxers several months ago.   Ortho gave me 2 shots in my back.    I'm so stiff and can't hardly move.  I'm wearing a brace for it.    I was wanting to see if he could give me a muscle relaxer again.   I never took the pain pills he gave me. 2. LOCATION: "Where does it hurt?" (upper, mid or lower back)     Lower back.     The shots in my back really helped for a while.   Now suddenly I'm having pain again.   I didn't do anything to hurt my back.    3. SEVERITY: "How bad is the pain?"  (e.g., Scale 1-10; mild, moderate, or severe)   - MILD (1-3): Doesn't interfere with normal activities.    - MODERATE (4-7): Interferes with normal activities or awakens from sleep.    - SEVERE (8-10): Excruciating pain, unable to do any normal activities.      8/10   4. PATTERN: "Is the pain constant?" (e.g., yes, no; constant, intermittent)      Constantly I'm tight now.  5. RADIATION: "Does the pain shoot into your legs or somewhere else?"     Left leg 6. CAUSE:  "What do you think is causing the back pain?"      I don't know why it's gotten so stiff. 7. BACK OVERUSE:  "Any recent lifting of heavy objects, strenuous work or exercise?"     No 8. MEDICINES: "What have you taken so far for the pain?" (e.g., nothing, acetaminophen, NSAIDS)     I'm wanting some muscle relaxers. 9. NEUROLOGIC SYMPTOMS: "Do you have any weakness, numbness, or problems with bowel/bladder control?"     Not asked 10. OTHER SYMPTOMS: "Do you have any other symptoms?" (e.g., fever, abdomen pain, burning with urination, blood in urine)       Not asked 11. PREGNANCY: "Is there any chance you are pregnant?" "When was your last menstrual period?"       N/A  Protocols  used: Back Pain-A-AH

## 2022-08-23 ENCOUNTER — Telehealth: Payer: Self-pay | Admitting: Cardiovascular Disease

## 2022-08-23 MED ORDER — ENTRESTO 49-51 MG PO TABS: 1.0000 | ORAL_TABLET | Freq: Two times a day (BID) | ORAL | 2 refills | Status: DC

## 2022-08-23 MED ORDER — ENTRESTO 49-51 MG PO TABS: 1.0000 | ORAL_TABLET | Freq: Two times a day (BID) | ORAL | 2 refills | Status: AC

## 2022-08-23 NOTE — Telephone Encounter (Signed)
*  STAT* If patient is at the pharmacy, call can be transferred to refill team.   1. Which medications need to be refilled? (please list name of each medication and dose if known) sacubitril-valsartan (ENTRESTO) 49-51 MG   2. Which pharmacy/location (including street and city if local pharmacy) is medication to be sent to? [facility]  3. Do they need a 30 day or 90 day supply? 90   Patient wants new prescription faxed to [facility] at [phone]

## 2022-08-23 NOTE — Addendum Note (Signed)
Addended by: Kendrick Fries on: 08/23/2022 10:33 AM   Modules accepted: Orders

## 2022-08-23 NOTE — Telephone Encounter (Signed)
Rx printed/signed and faxed to [employer] [facility] assistance program.

## 2022-08-30 ENCOUNTER — Ambulatory Visit (INDEPENDENT_AMBULATORY_CARE_PROVIDER_SITE_OTHER): Payer: PPO | Admitting: Family Medicine

## 2022-08-30 ENCOUNTER — Encounter: Payer: Self-pay | Admitting: Family Medicine

## 2022-08-30 VITALS — BP 145/89 | HR 66 | Ht 72.0 in | Wt 173.0 lb

## 2022-08-30 DIAGNOSIS — M545 Low back pain, unspecified: Secondary | ICD-10-CM

## 2022-08-30 DIAGNOSIS — I4891 Unspecified atrial fibrillation: Secondary | ICD-10-CM | POA: Diagnosis not present

## 2022-08-30 DIAGNOSIS — D689 Coagulation defect, unspecified: Secondary | ICD-10-CM

## 2022-08-30 MED ORDER — CYCLOBENZAPRINE HCL 5 MG PO TABS
5.0000 mg | ORAL_TABLET | Freq: Two times a day (BID) | ORAL | 1 refills | Status: DC | PRN
Start: 2022-08-30 — End: 2022-10-29

## 2022-08-30 NOTE — Patient Instructions (Signed)
.   Please review the attached list of medications and notify my office if there are any errors.   . Please bring all of your medications to every appointment so we can make sure that our medication list is the same as yours.   

## 2022-08-30 NOTE — Progress Notes (Signed)
Established patient visit   Patient: Samuel Woods   DOB: 20-Feb-1960   62 y.o. Male  MRN: [id number] Visit Date: 08/30/2022  Today's healthcare provider: Mila Merry, MD   Chief Complaint  Patient presents with   Back Pain   Subjective    Discussed the use of AI scribe software for clinical note transcription with the patient, who gave verbal consent to proceed.  History of Present Illness   The patient, with a history of chronic back pain, presents with a recurrence of the same issue. He was referred to [facility] in January and reports that imaging studies, including X-rays and an MRI, did not reveal a definitive cause for the pain. He had SI injections and reports there was significant relief following the injections, allowing him to resume daily walking and upper body strength exercises. However, the relief was temporary, and the back pain returned within a few weeks.  The patient also tried muscle relaxants and massage therapy, which provided temporary relief but did not resolve the issue. The patient expressed reluctance to consider surgery, given the lack of a clear diagnosis. He also reported wearing a back brace, even during sleep, to manage the discomfort.  The patient has been taking ibuprofen for pain management, up to 12 x 200mg  per day, but expressed concerns about potential side effects given his concurrent use of Eliquis, a blood thinner. He reported frequent instances of unexplained bleeding and red blotches on the skin. The patient also mentioned a complete cessation of alcohol consumption and a history of smoking cessation.  Despite the persistent back pain, the patient has been diligent in maintaining physical activity, including walking and upper body strength exercises. However, he reported that sitting exacerbates the back pain, and he finds relief in standing or lying flat on the floor with his feet elevated. The patient expressed a desire to find a long-term  solution to manage his chronic back pain.       Medications: Outpatient Medications Prior to Visit  Medication Sig   ALPRAZolam (XANAX) 1 MG tablet TAKE 1/2 TO 1 (ONE-HALF TO ONE) TABLET BY MOUTH THREE TIMES DAILY AS NEEDED   amiodarone (PACERONE) 200 MG tablet Take 1 tablet (200 mg total) by mouth daily.   atorvastatin (LIPITOR) 40 MG tablet Take 1 tablet by mouth once daily   carvedilol (COREG) 6.25 MG tablet Take 1 tablet (6.25 mg total) by mouth 2 (two) times daily.   ELIQUIS 5 MG TABS tablet Take 1 tablet by mouth twice daily   ezetimibe (ZETIA) 10 MG tablet Take 1 tablet (10 mg total) by mouth daily.   ibuprofen (ADVIL,MOTRIN) 200 MG tablet Take 400 mg by mouth every 8 (eight) hours as needed (for pain.).    metFORMIN (GLUCOPHAGE-XR) 500 MG 24 hr tablet Take 1 tablet by mouth once daily with breakfast   Multiple Vitamin (MULTIVITAMIN WITH MINERALS) TABS tablet Take 1 tablet by mouth daily. Centrum Silver Men 50+   sacubitril-valsartan (ENTRESTO) 49-51 MG Take 1 tablet by mouth 2 (two) times daily.   torsemide (DEMADEX) 5 MG tablet TAKE 1 TO 2 TABLETS BY MOUTH ONCE DAILY   traZODone (DESYREL) 100 MG tablet TAKE 1/2 TO 1 (ONE-HALF TO ONE) TABLET BY MOUTH AT BEDTIME AS NEEDED FOR SLEEP   [DISCONTINUED] tiZANidine (ZANAFLEX) 4 MG tablet TAKE 1 TABLET BY MOUTH EVERY 8 HOURS AS NEEDED FOR MUSCLE SPASM   [DISCONTINUED] oxyCODONE-acetaminophen (PERCOCET) 5-325 MG tablet Take 1 tablet by mouth daily as needed  for severe pain. (Patient not taking: Reported on 05/27/2022)   No facility-administered medications prior to visit.   Review of Systems       Objective    BP (!) 145/89   Pulse 66   Ht 6' (1.829 m)   Wt 173 lb (78.5 kg)   BMI 23.46 kg/m      Physical Exam  Physical Exam        No results found for any visits on 08/30/22.  Assessment & Plan     Assessment and Plan    Lower Back Pain: Persistent despite previous imaging showing no abnormalities. Patient reports  temporary relief from steroid injections and muscle relaxants. No clear surgical indication. -Request MRI report from [facility] for review. -Prescribe Flexeril for muscle relaxation, to be taken at night due to sedative effects. -Consider referral to [facility] for targeted exercises and pain management strategies.  Eliquis Use: Patient reports easy bruising and bleeding, likely due to combination of Eliquis and high-dose ibuprofen. -Advise patient that it is not recommended to take ibuprofen when on Eliquis. Do recommend frequent application of cold packs.  General Health Maintenance: Patient reports cessation of alcohol consumption and maintenance of exercise regimen. -Encourage continuation of these positive lifestyle changes. -Follow up with patient after review of MRI report.           Mila Merry, MD  [facility] [phone] (phone) [phone] (fax)  [facility]

## 2022-09-19 ENCOUNTER — Other Ambulatory Visit: Payer: Self-pay | Admitting: Family Medicine

## 2022-09-19 DIAGNOSIS — E119 Type 2 diabetes mellitus without complications: Secondary | ICD-10-CM

## 2022-09-19 NOTE — Telephone Encounter (Signed)
Requested medications are due for refill today.  yes  Requested medications are on the active medications list.  yes  Last refill. 06/24/2022 #90 0 rf  Future visit scheduled.   no  Notes to clinic.  Expired labs    Requested Prescriptions  Pending Prescriptions Disp Refills   metFORMIN (GLUCOPHAGE-XR) 500 MG 24 hr tablet [Pharmacy Med Name: metFORMIN HCl ER 500 MG Oral Tablet Extended Release 24 Hour] 90 tablet 0    Sig: Take 1 tablet by mouth once daily with breakfast     Endocrinology:  Diabetes - Biguanides Failed - 09/19/2022  9:15 AM      Failed - HBA1C is between 0 and 7.9 and within 180 days    Hgb A1c MFr Bld  Date Value Ref Range Status  02/22/2022 6.2 (H) 4.8 - 5.6 % Final    Comment:             Prediabetes: 5.7 - 6.4          Diabetes: >6.4          Glycemic control for adults with diabetes: <7.0          Failed - B12 Level in normal range and within 720 days    No results found for: "VITAMINB12"       Failed - CBC within normal limits and completed in the last 12 months    WBC  Date Value Ref Range Status  05/08/2021 7.2 3.4 - 10.8 x10E3/uL Final  07/06/2017 9.4 3.8 - 10.6 K/uL Final   RBC  Date Value Ref Range Status  05/08/2021 5.03 4.14 - 5.80 x10E6/uL Final  07/06/2017 5.32 4.40 - 5.90 MIL/uL Final   Hemoglobin  Date Value Ref Range Status  05/08/2021 15.3 13.0 - 17.7 g/dL Final   Hematocrit  Date Value Ref Range Status  05/08/2021 45.2 37.5 - 51.0 % Final   MCHC  Date Value Ref Range Status  05/08/2021 33.8 31.5 - 35.7 g/dL Final  64/40/3474 25.9 32.0 - 36.0 g/dL Final   [facility]  Date Value Ref Range Status  05/08/2021 30.4 26.6 - 33.0 pg Final  07/06/2017 31.2 26.0 - 34.0 pg Final   MCV  Date Value Ref Range Status  05/08/2021 90 79 - 97 fL Final   No results found for: "PLTCOUNTKUC", "LABPLAT", "POCPLA" RDW  Date Value Ref Range Status  05/08/2021 13.1 11.6 - 15.4 % Final         Passed - Cr in normal range and within 360 days     Creatinine, Ser  Date Value Ref Range Status  10/08/2021 1.13 0.76 - 1.27 mg/dL Final         Passed - eGFR in normal range and within 360 days    GFR calc Af Amer  Date Value Ref Range Status  12/20/2019 70 >59 mL/min/1.73 Final    Comment:    **In accordance with recommendations from the NKF-ASN Task force,**   [facility] is in the process of updating its eGFR calculation to the   2021 CKD-EPI creatinine equation that estimates kidney function   without a race variable.    GFR calc non Af Amer  Date Value Ref Range Status  12/20/2019 60 >59 mL/min/1.73 Final   eGFR  Date Value Ref Range Status  10/08/2021 73 >59 mL/min/1.73 Final         Passed - Valid encounter within last 6 months    Recent Outpatient Visits  2 weeks ago Low back pain without sciatica   [facility] Malva Limes, MD   6 months ago Controlled type 2 diabetes mellitus without complication, without long-term current use of insulin (HCC)   [facility] Malva Limes, MD   11 months ago Controlled type 2 diabetes mellitus without complication, without long-term current use of insulin [facility])   [place] [facility] Malva Limes, MD   1 year ago Annual physical exam   [facility] Malva Limes, MD   1 year ago Controlled type 2 diabetes mellitus without complication, without long-term current use of insulin [facility])   [place] [facility] Sherrie Mustache, Demetrios Isaacs, MD

## 2022-09-24 ENCOUNTER — Other Ambulatory Visit: Payer: Self-pay | Admitting: Family Medicine

## 2022-09-24 DIAGNOSIS — F419 Anxiety disorder, unspecified: Secondary | ICD-10-CM

## 2022-10-01 ENCOUNTER — Other Ambulatory Visit: Payer: Self-pay | Admitting: Cardiovascular Disease

## 2022-10-09 DIAGNOSIS — M533 Sacrococcygeal disorders, not elsewhere classified: Secondary | ICD-10-CM | POA: Diagnosis not present

## 2022-10-17 ENCOUNTER — Ambulatory Visit: Payer: Self-pay

## 2022-10-17 ENCOUNTER — Telehealth: Payer: Self-pay | Admitting: Family Medicine

## 2022-10-17 DIAGNOSIS — I480 Paroxysmal atrial fibrillation: Secondary | ICD-10-CM

## 2022-10-17 NOTE — Telephone Encounter (Signed)
  Chief Complaint: Medication cost has increased Symptoms: none Frequency: now Pertinent Negatives: Patient denies  Disposition: [] ED /[] Urgent Care (no appt availability in office) / [] Appointment(In office/virtual)/ []  [place] [facility]/ [] Home Care/ [] Refused Recommended Disposition /[]  Mobile Bus/ [x]  Follow-up with PCP Additional Notes: Pt called with several issues regarding Eliquis. Pt states that medication now costs $145 a month, when it was $45 a month.  Also pt states that last Rx was for 30 day supply, instead of 90 day supply. Pt states he called his insurance company and they told him he is in the "doughnut hole" where he has used up his rx money for the year.  Pt states that he was on a program that helped him with cost before.  Pt would like to go back on this program, or be prescribed a different medication, or asks if he can reduce medication to once a day.  Please advise.   Pt states his Rx ELIQUIS 5 MG TABS tablet has gone from $45 to $148.  He is in the doughnut hole.  Would like or something else or cut to once a day.  ** The message may be incomplete. It was sent as a result of a timeout. *   Reason for Disposition  [1] Caller requesting NON-URGENT health information AND [2] PCP's office is the best resource  Answer Assessment - Initial Assessment Questions 1. REASON FOR CALL or QUESTION: "What is your reason for calling today?" or "How can I best help you?" or "What question do you have that I can help answer?"     Eliquis cost has increased from  $45 to $145 for a 30 day supply.  Protocols used: Information Only Call - No Triage-A-AH

## 2022-10-17 NOTE — Telephone Encounter (Signed)
Pt states his Rx ELIQUIS 5 MG TABS tablet has gone from $45 to $148.  He is in the doughnut hole.  Would liketo know if there is something else he can take,  or cut to once a day.  But he cannot afford this medication.

## 2022-10-18 MED ORDER — APIXABAN 5 MG PO TABS
5.0000 mg | ORAL_TABLET | Freq: Two times a day (BID) | ORAL | 0 refills | Status: DC
Start: 2022-10-18 — End: 2022-10-24

## 2022-10-18 NOTE — Telephone Encounter (Signed)
Patient advised. Verbalized understanding.  Patient provided with 5 boxes of Eliquis 5 mg tablet 14X5 = 70 (60 tabs is month supply for patient)

## 2022-10-18 NOTE — Telephone Encounter (Signed)
Can have a months' worth of samples. Recommend he discuss this with his cardiologist. He may consider taking off of Eliquis since EKG no longer show atrial fibrillation.

## 2022-10-19 ENCOUNTER — Encounter: Payer: Self-pay | Admitting: Family Medicine

## 2022-10-19 DIAGNOSIS — M5126 Other intervertebral disc displacement, lumbar region: Secondary | ICD-10-CM | POA: Insufficient documentation

## 2022-10-22 ENCOUNTER — Telehealth: Payer: Self-pay | Admitting: Cardiovascular Disease

## 2022-10-22 NOTE — Telephone Encounter (Signed)
PCP office gave one month supply of Eliquis on 8/30. Per pcp patient needs to speak with cardiologist about possibly stopping Eliqis since he's not in a-fib. Patient unable to afford medication.

## 2022-10-22 NOTE — Telephone Encounter (Signed)
Called and spoke with patient. Informed patient that [employer] offers patient assistance. Patient states that he is interested in applying. Forms placed in office for patient to pick up.

## 2022-10-22 NOTE — Telephone Encounter (Signed)
Patient calling the office for samples of medication:   1.  What medication and dosage are you requesting samples for? apixaban (ELIQUIS) 5 MG TABS tablet  2.  Are you currently out of this medication? Yes    

## 2022-10-23 ENCOUNTER — Telehealth: Payer: Self-pay

## 2022-10-23 NOTE — Telephone Encounter (Signed)
Looks like patient had reached out to heart care and according to the messages I see forms were placed up front for patient to come by to pick them up.  Called patient and per patient forms were pickup today and is going to drop of the forms tomorrow.

## 2022-10-23 NOTE — Telephone Encounter (Signed)
Copied from CRM [id number]. Topic: General - Other >> Oct 23, 2022  9:41 AM Phill Myron wrote: Mr. Constable would like to pick up patient assistance forms, to assist with the mediation: Eliquis ... Currently paying $150.00     he would also like to thank you for the samples

## 2022-10-24 ENCOUNTER — Other Ambulatory Visit: Payer: Self-pay | Admitting: *Deleted

## 2022-10-24 ENCOUNTER — Telehealth: Payer: Self-pay | Admitting: *Deleted

## 2022-10-24 DIAGNOSIS — I480 Paroxysmal atrial fibrillation: Secondary | ICD-10-CM

## 2022-10-24 MED ORDER — APIXABAN 5 MG PO TABS
5.0000 mg | ORAL_TABLET | Freq: Two times a day (BID) | ORAL | 3 refills | Status: DC
Start: 2022-10-24 — End: 2023-01-08

## 2022-10-24 NOTE — Telephone Encounter (Signed)
Patient came into office to provide income documentation for assistance applications. He had already completed Entresto paperwork but also brought in Eliquis application today. Advised that I would need out of pocket expense report from pharmacy for both him and his wife to submit with application. He verbalized understanding and will be bringing those forms back. No further needs at this time.

## 2022-10-24 NOTE — Telephone Encounter (Signed)
Applications for Eliquis and Sherryll Burger have been completed, faxed, and filed. No further needs.

## 2022-10-28 ENCOUNTER — Other Ambulatory Visit: Payer: Self-pay | Admitting: Family Medicine

## 2022-10-28 DIAGNOSIS — M545 Low back pain, unspecified: Secondary | ICD-10-CM

## 2022-11-12 ENCOUNTER — Other Ambulatory Visit: Payer: Self-pay | Admitting: Cardiovascular Disease

## 2022-11-18 ENCOUNTER — Telehealth: Payer: Self-pay | Admitting: Cardiovascular Disease

## 2022-11-18 MED ORDER — CARVEDILOL 6.25 MG PO TABS: 6.2500 mg | ORAL_TABLET | Freq: Two times a day (BID) | ORAL | 0 refills | Status: DC

## 2022-11-18 NOTE — Telephone Encounter (Signed)
*  STAT* If patient is at the pharmacy, call can be transferred to refill team.   1. Which medications need to be refilled? (please list name of each medication and dose if known)   carvedilol (COREG) 6.25 MG tablet   2. Would you like to learn more about the convenience, safety, & potential cost savings by using the [facility]?   3. Are you open to using the [facility] (Type [facility]. ).  4. Which pharmacy/location (including street and city if local pharmacy) is medication to be sent to?  [facility] [address] [place] - [address]   5. Do they need a 30 day or 90 day supply?   90 day  Patient stated he is completely out of this medication.   Patient has appointment scheduled on 12/16.

## 2022-11-18 NOTE — Telephone Encounter (Signed)
Requested Prescriptions   Signed Prescriptions Disp Refills   carvedilol (COREG) 6.25 MG tablet 180 tablet 0    Sig: Take 1 tablet (6.25 mg total) by mouth 2 (two) times daily.    Authorizing Provider: Antonieta Iba    Ordering User: Guerry Minors

## 2022-11-20 ENCOUNTER — Ambulatory Visit (INDEPENDENT_AMBULATORY_CARE_PROVIDER_SITE_OTHER): Payer: PPO

## 2022-11-20 DIAGNOSIS — Z23 Encounter for immunization: Secondary | ICD-10-CM | POA: Diagnosis not present

## 2022-11-28 ENCOUNTER — Other Ambulatory Visit: Payer: Self-pay | Admitting: Family Medicine

## 2022-11-28 DIAGNOSIS — F419 Anxiety disorder, unspecified: Secondary | ICD-10-CM

## 2022-11-28 NOTE — Telephone Encounter (Signed)
Patient was denied for his Eliquis. Letter was sent to patient by company

## 2022-12-23 ENCOUNTER — Other Ambulatory Visit: Payer: Self-pay | Admitting: Family Medicine

## 2022-12-23 DIAGNOSIS — E119 Type 2 diabetes mellitus without complications: Secondary | ICD-10-CM

## 2023-01-07 ENCOUNTER — Telehealth: Payer: Self-pay | Admitting: Cardiovascular Disease

## 2023-01-07 ENCOUNTER — Other Ambulatory Visit: Payer: Self-pay

## 2023-01-07 ENCOUNTER — Other Ambulatory Visit: Payer: Self-pay | Admitting: Family Medicine

## 2023-01-07 DIAGNOSIS — I480 Paroxysmal atrial fibrillation: Secondary | ICD-10-CM

## 2023-01-07 MED ORDER — APIXABAN 5 MG PO TABS: 5.0000 mg | ORAL_TABLET | Freq: Two times a day (BID) | ORAL | 0 refills | Status: DC

## 2023-01-07 NOTE — Telephone Encounter (Signed)
Sample request for Eliquis received. Indication: AF Last office visit: 01/29/22 Scr: 1.13 on 10/08/21  Epic Age: 62 Weight: 78.5kg  Based on above findings Eliquis 5mg  twice daily is the appropriate dose.  OK to provide samples if available.

## 2023-01-07 NOTE — Telephone Encounter (Signed)
Called patient, made aware we could give 2 boxes, he will try and get the rest of the year through his pharmacy.   Patient verbalized understanding, will be by to pick up what is provided.  Thanks!

## 2023-01-07 NOTE — Telephone Encounter (Signed)
Patient calling the office for samples of medication:   1.  What medication and dosage are you requesting samples for?  apixaban (ELIQUIS) 5 MG TABS tablet    2.  Are you currently out of this medication?   No Pt is in the doughnut hole

## 2023-01-08 NOTE — Telephone Encounter (Signed)
Requested medication (s) are due for refill today: yes   Requested medication (s) are on the active medication list: yes   Last refill:  10/24/22 #180 3 refills  Future visit scheduled: no   Notes to clinic:   last ordered by Julien Nordmann, MD 10/24/22. Do you want to refill Rx?     Requested Prescriptions  Pending Prescriptions Disp Refills   ELIQUIS 5 MG TABS tablet [Pharmacy Med Name: Eliquis 5 MG Oral Tablet] 60 tablet 0    Sig: Take 1 tablet by mouth twice daily     Hematology:  Anticoagulants - apixaban Failed - 01/07/2023  9:53 AM      Failed - PLT in normal range and within 360 days    Platelets  Date Value Ref Range Status  05/08/2021 339 150 - 450 x10E3/uL Final         Failed - HGB in normal range and within 360 days    Hemoglobin  Date Value Ref Range Status  05/08/2021 15.3 13.0 - 17.7 g/dL Final         Failed - HCT in normal range and within 360 days    Hematocrit  Date Value Ref Range Status  05/08/2021 45.2 37.5 - 51.0 % Final         Failed - Cr in normal range and within 360 days    Creatinine, Ser  Date Value Ref Range Status  10/08/2021 1.13 0.76 - 1.27 mg/dL Final         Failed - AST in normal range and within 360 days    AST  Date Value Ref Range Status  10/08/2021 34 0 - 40 IU/L Final         Failed - ALT in normal range and within 360 days    ALT  Date Value Ref Range Status  10/08/2021 55 (H) 0 - 44 IU/L Final         Passed - Valid encounter within last 12 months    Recent Outpatient Visits           4 months ago Low back pain without sciatica   [facility] Malva Limes, MD   10 months ago Controlled type 2 diabetes mellitus without complication, without long-term current use of insulin (HCC)   [facility] Malva Limes, MD   1 year ago Controlled type 2 diabetes mellitus without complication, without long-term current use of insulin (HCC)   [facility] Malva Limes, MD   1 year ago Annual physical exam   [facility] Malva Limes, MD   2 years ago Controlled type 2 diabetes mellitus without complication, without long-term current use of insulin (HCC)   [place] [facility] Sherrie Mustache, Demetrios Isaacs, MD       Future Appointments             In 3 weeks Gollan, Tollie Pizza, MD [facility]

## 2023-01-17 ENCOUNTER — Other Ambulatory Visit: Payer: Self-pay | Admitting: Cardiovascular Disease

## 2023-01-21 ENCOUNTER — Encounter: Payer: Self-pay | Admitting: Family Medicine

## 2023-01-23 ENCOUNTER — Other Ambulatory Visit: Payer: Self-pay | Admitting: Cardiovascular Disease

## 2023-01-28 ENCOUNTER — Other Ambulatory Visit: Payer: Self-pay | Admitting: Family Medicine

## 2023-01-28 DIAGNOSIS — M545 Low back pain, unspecified: Secondary | ICD-10-CM

## 2023-01-29 NOTE — Telephone Encounter (Signed)
Please advise 

## 2023-02-02 NOTE — Progress Notes (Unsigned)
This cardiology Office Note  Date:  02/03/2023   ID:  Samuel Woods, Samuel Woods Aug 09, 1960, MRN [id number]  PCP:  Malva Limes, MD   Chief Complaint  Patient presents with   12 month follow up     Patient c/o bruising & easily to bleed. Medications reviewed by the patient verbally.     HPI:  Samuel Woods is a 62 year old gentleman with past medical history of Former smoker, quit >10 years ago Hyperlipidemia Paroxysmal atrial fibrillation moderate mitral valve regurgitation and tricuspid valve regurgitation  MVR 2004, Dr. Cornelius Moras  Prediabetes Lumbar fusion surgery/low back pain 7/23: Coronary calcium score of 678. This was 91st percentile for age and sex matched control. Who presents for f/u of his atrial fibrillation and diastolic dysfunction, moderate mitral valve regurgitation  LOV December 2023 In follow-up reports he feels well Walks regularly for exercise  Denies significant tachycardia or palpitations concerning for arrhythmia No chest pain or shortness of breath concerning for angina Denies orthostasis symptoms  Lab work reviewed A1C 6.2 Total cholesterol 167 LDL 83  EKG personally reviewed by myself on todays visit EKG Interpretation Date/Time:  Monday February 03 2023 16:28:50 EST Ventricular Rate:  57 PR Interval:  154 QRS Duration:  102 QT Interval:  504 QTC Calculation: 490 R Axis:   0  Text Interpretation: Sinus bradycardia When compared with ECG of 17-Nov-2017 06:57, No significant change was found Confirmed by Julien Nordmann [phone]) on 02/03/2023 4:59:12 PM   He is retired from telecommunications  Other past medical history reviewed s/p cardioversion in 2019 maintained in XR on amiodarone  echo in 2019 was 37%, improved to 50-55% 10/2018 on maintenance dose of Entresto.     PMH:   has a past medical history of Afib (HCC), Dysrhythmia, History of chicken pox, History of lung abscess (01/18/2009), History of measles, Hyperlipidemia, and  Hypertension.  PSH:    Past Surgical History:  Procedure Laterality Date   CARDIOVERSION Right 11/17/2017   Procedure: CARDIOVERSION;  Surgeon: Laurier Nancy, MD;  Location: [facility] ORS;  Service: Cardiovascular;  Laterality: Right;   COLONOSCOPY WITH PROPOFOL N/A 05/05/2020   Procedure: COLONOSCOPY WITH PROPOFOL;  Surgeon: Wyline Mood, MD;  [facility] ENDOSCOPY;  Service: Gastroenterology;  Laterality: N/A;   DOPPLER ECHOCARDIOGRAPHY  08/12/2007   Mild to moderate stenosis. Trace TR.Mild pulmonary hypertension. Borderline LVH. Mild depressed right systolic function. LVEF=50-55%   MITRAL VALVE REPAIR  05/2002   SPINAL FUSION     TEE WITHOUT CARDIOVERSION N/A 11/17/2017   Procedure: TRANSESOPHAGEAL ECHOCARDIOGRAM (TEE);  Surgeon: Laurier Nancy, MD;  Location: [facility] ORS;  Service: Cardiovascular;  Laterality: N/A;    Current Outpatient Medications  Medication Sig Dispense Refill   ALPRAZolam (XANAX) 1 MG tablet TAKE 1/2 TO 1 (ONE-HALF TO ONE) TABLET BY MOUTH THREE TIMES DAILY AS NEEDED 90 tablet 1   amiodarone (PACERONE) 200 MG tablet Take 1 tablet by mouth once daily 90 tablet 0   apixaban (ELIQUIS) 5 MG TABS tablet Take 1 tablet by mouth twice daily 60 tablet 11   atorvastatin (LIPITOR) 40 MG tablet Take 1 tablet by mouth once daily 90 tablet 1   carvedilol (COREG) 6.25 MG tablet Take 1 tablet (6.25 mg total) by mouth 2 (two) times daily. 180 tablet 0   cyclobenzaprine (FLEXERIL) 5 MG tablet TAKE 1 TO 2 TABLETS BY MOUTH TWICE DAILY AS NEEDED FOR MUSCLE SPASM 60 tablet 3   ezetimibe (ZETIA) 10 MG tablet Take 1 tablet by mouth once daily  90 tablet 0   ibuprofen (ADVIL,MOTRIN) 200 MG tablet Take 400 mg by mouth every 8 (eight) hours as needed (for pain.).      metFORMIN (GLUCOPHAGE-XR) 500 MG 24 hr tablet Take 1 tablet by mouth once daily with breakfast 90 tablet 0   Multiple Vitamin (MULTIVITAMIN WITH MINERALS) TABS tablet Take 1 tablet by mouth daily. Centrum Silver Men 50+      sacubitril-valsartan (ENTRESTO) 49-51 MG Take 1 tablet by mouth 2 (two) times daily. 180 tablet 2   torsemide (DEMADEX) 5 MG tablet TAKE 1 TO 2 TABLETS BY MOUTH ONCE DAILY 180 tablet 1   traZODone (DESYREL) 100 MG tablet TAKE 1/2 TO 1 (ONE-HALF TO ONE) TABLET BY MOUTH AT BEDTIME AS NEEDED FOR SLEEP 30 tablet 3   No current facility-administered medications for this visit.    Allergies:   Dilaudid [hydromorphone hcl]   Social History:  The patient  reports that he has been smoking cigarettes and e-cigarettes. He started smoking about 20 years ago. He has a 7.5 pack-year smoking history. He has never used smokeless tobacco. He reports that he does not currently use alcohol. He reports current drug use. Drug: Marijuana.   Family History:   family history includes Alcohol abuse in his father; Cancer in an other family member; Hypertension in his brother.    Review of Systems: Review of Systems  Constitutional: Negative.   HENT: Negative.    Respiratory: Negative.    Cardiovascular: Negative.   Gastrointestinal: Negative.   Musculoskeletal: Negative.   Neurological: Negative.   Psychiatric/Behavioral: Negative.    All other systems reviewed and are negative.   PHYSICAL EXAM: VS:  BP 120/80 (BP Location: Left Arm, Patient Position: Sitting)   Pulse (!) 57   Resp (!) 98   Ht 6' (1.829 m)   Wt 174 lb 8 oz (79.2 kg)   BMI 23.67 kg/m  , BMI Body mass index is 23.67 kg/m. Constitutional:  oriented to person, place, and time. No distress.  HENT:  Head: Grossly normal Eyes:  no discharge. No scleral icterus.  Neck: No JVD, no carotid bruits  Cardiovascular: Regular rate and rhythm, no murmurs appreciated Pulmonary/Chest: Clear to auscultation bilaterally, no wheezes or rails Abdominal: Soft.  no distension.  no tenderness.  Musculoskeletal: Normal range of motion Neurological:  normal muscle tone. Coordination normal. No atrophy Skin: Skin warm and dry Psychiatric: normal affect,  pleasant  Recent Labs: No results found for requested labs within last 365 days.    Lipid Panel Lab Results  Component Value Date   CHOL 167 02/22/2022   HDL 64 02/22/2022   LDLCALC 83 02/22/2022   TRIG 113 02/22/2022      Wt Readings from Last 3 Encounters:  02/03/23 174 lb 8 oz (79.2 kg)  08/30/22 173 lb (78.5 kg)  05/21/22 162 lb 12.8 oz (73.8 kg)     ASSESSMENT AND PLAN:  Problem List Items Addressed This Visit       Cardiology Problems   Atrial fibrillation (HCC) - Primary   Relevant Orders   EKG 12-Lead (Completed)   Aortic atherosclerosis (HCC)   Relevant Orders   EKG 12-Lead (Completed)   Mitral regurgitation   Relevant Orders   EKG 12-Lead (Completed)   HLD (hyperlipidemia)   Hypertension   Relevant Orders   EKG 12-Lead (Completed)     Other   Controlled type 2 diabetes mellitus without complication, without long-term current use of insulin (HCC)   Other Visit Diagnoses  Coronary artery calcification       Relevant Orders   EKG 12-Lead (Completed)      Paroxysmal atrial fibrillation Maintaining normal sinus rhythm Recommend we continue amiodarone 200 mg daily with carvedilol 6.25 twice daily Continue Eliquis 5 twice daily  Dizziness Prior history of dizziness Symptoms improved with reduced dose carvedilol  Mitral valve regurgitation/s/p MVR Moderate mitral valve regurgitation Last evaluated December 2021 Pete echocardiogram ordered  Tricuspid valve regurgitation Last evaluated December 2021 Repeat echocardiogram ordered  Debility Previously working at [employer], out on disability  Elevated calcium scoring Denies anginal symptoms, goal LDL less than 70 Continue Lipitor with Zetia  Hyperlipidemia Stressed importance of staying on his Lipitor and Zetia LDL slightly above goal     Signed, Dossie Arbour, M.D., Ph.D. [facility] [place], [place] [phone]

## 2023-02-03 ENCOUNTER — Ambulatory Visit: Payer: PPO | Attending: Cardiovascular Disease | Admitting: Cardiovascular Disease

## 2023-02-03 ENCOUNTER — Encounter: Payer: Self-pay | Admitting: Cardiovascular Disease

## 2023-02-03 VITALS — BP 120/80 | HR 57 | Resp 98 | Ht 72.0 in | Wt 174.5 lb

## 2023-02-03 DIAGNOSIS — E119 Type 2 diabetes mellitus without complications: Secondary | ICD-10-CM

## 2023-02-03 DIAGNOSIS — Z9889 Other specified postprocedural states: Secondary | ICD-10-CM

## 2023-02-03 DIAGNOSIS — I480 Paroxysmal atrial fibrillation: Secondary | ICD-10-CM | POA: Diagnosis not present

## 2023-02-03 DIAGNOSIS — I340 Nonrheumatic mitral (valve) insufficiency: Secondary | ICD-10-CM | POA: Diagnosis not present

## 2023-02-03 DIAGNOSIS — I2510 Atherosclerotic heart disease of native coronary artery without angina pectoris: Secondary | ICD-10-CM | POA: Diagnosis not present

## 2023-02-03 DIAGNOSIS — I10 Essential (primary) hypertension: Secondary | ICD-10-CM | POA: Diagnosis not present

## 2023-02-03 DIAGNOSIS — I700 Atherosclerosis of aorta: Secondary | ICD-10-CM

## 2023-02-03 DIAGNOSIS — E782 Mixed hyperlipidemia: Secondary | ICD-10-CM

## 2023-02-03 NOTE — Patient Instructions (Addendum)
 Medication Instructions:  No changes  If you need a refill on your cardiac medications before your next appointment, please call your pharmacy.   Lab work: No new labs needed  Testing/Procedures: Your physician has requested that you have an echocardiogram. Echocardiography is a painless test that uses sound waves to create images of your heart. It provides your doctor with information about the size and shape of your heart and how well your heart's chambers and valves are working.   You may receive an ultrasound enhancing agent through an IV if needed to better visualize your heart during the echo. This procedure takes approximately one hour.  There are no restrictions for this procedure.  This will take place at [address] [facility] [address], [place] [address]  Please note: We ask at that you not bring children with you during ultrasound (echo/ vascular) testing. Due to room size and safety concerns, children are not allowed in the ultrasound rooms during exams. Our front office staff cannot provide observation of children in our lobby area while testing is being conducted. An adult accompanying a patient to their appointment will only be allowed in the ultrasound room at the discretion of the ultrasound technician under special circumstances. We apologize for any inconvenience.   Follow-Up: At [facility], you and your health needs are our priority.  As part of our continuing mission to provide you with exceptional heart care, we have created designated Provider Care Teams.  These Care Teams include your primary Cardiologist (physician) and Advanced Practice Providers (APPs -  Physician Assistants and Nurse Practitioners) who all work together to provide you with the care you need, when you need it.  You will need a follow up appointment in 12 months  Providers on your designated Care Team:   Nicolasa Ducking, NP Eula Listen, PA-C Cadence Fransico Michael, [place]  COVID-19  Vaccine Information can be found at: [email or web address] For questions related to vaccine distribution or appointments, please email [email or web address] or call [phone].

## 2023-02-04 ENCOUNTER — Telehealth: Payer: Self-pay | Admitting: Cardiovascular Disease

## 2023-02-04 NOTE — Telephone Encounter (Signed)
Left voicemail, pt needs to be scheduled for echocardiogram

## 2023-02-06 ENCOUNTER — Other Ambulatory Visit: Payer: Self-pay | Admitting: Family Medicine

## 2023-02-06 DIAGNOSIS — F419 Anxiety disorder, unspecified: Secondary | ICD-10-CM

## 2023-02-06 NOTE — Telephone Encounter (Signed)
Requested medications are due for refill today.  yes  Requested medications are on the active medications list.  yes  Last refill. 11/29/2022 #90 1 rf  Future visit scheduled.   yes  Notes to clinic.  Refill/refusal not delegated.    Requested Prescriptions  Pending Prescriptions Disp Refills   ALPRAZolam (XANAX) 1 MG tablet [Pharmacy Med Name: ALPRAZolam 1 MG Oral Tablet] 90 tablet 0    Sig: TAKE 1/2 TO 1 (ONE-HALF TO ONE) TABLET BY MOUTH THREE TIMES DAILY AS NEEDED     Not Delegated - Psychiatry: Anxiolytics/Hypnotics 2 Failed - 02/06/2023  2:56 PM      Failed - This refill cannot be delegated      Failed - Urine Drug Screen completed in last 360 days      Passed - Patient is not pregnant      Passed - Valid encounter within last 6 months    Recent Outpatient Visits           5 months ago Low back pain without sciatica   [facility] Malva Limes, MD   11 months ago Controlled type 2 diabetes mellitus without complication, without long-term current use of insulin (HCC)   [facility] Malva Limes, MD   1 year ago Controlled type 2 diabetes mellitus without complication, without long-term current use of insulin (HCC)   [employer] [facility] Malva Limes, MD   1 year ago Annual physical exam   [facility] Malva Limes, MD   2 years ago Controlled type 2 diabetes mellitus without complication, without long-term current use of insulin (HCC)   [facility] Sherrie Mustache, Demetrios Isaacs, MD

## 2023-02-09 ENCOUNTER — Other Ambulatory Visit: Payer: Self-pay | Admitting: Cardiovascular Disease

## 2023-02-10 ENCOUNTER — Other Ambulatory Visit: Payer: Self-pay | Admitting: Cardiovascular Disease

## 2023-02-27 ENCOUNTER — Ambulatory Visit: Payer: PPO | Attending: Cardiovascular Disease

## 2023-02-27 DIAGNOSIS — Z9889 Other specified postprocedural states: Secondary | ICD-10-CM

## 2023-02-27 DIAGNOSIS — I340 Nonrheumatic mitral (valve) insufficiency: Secondary | ICD-10-CM | POA: Diagnosis not present

## 2023-02-28 LAB — ECHOCARDIOGRAM COMPLETE
AR max vel: 2.44 cm2
AV Area VTI: 2.47 cm2
AV Area mean vel: 2.35 cm2
AV Mean grad: 3 mm[Hg]
AV Peak grad: 6.1 mm[Hg]
Ao pk vel: 1.23 m/s
Area-P 1/2: 2.87 cm2
Calc EF: 43.3 %
MV VTI: 1.24 cm2
S' Lateral: 4.1 cm
Single Plane A2C EF: 42.9 %
Single Plane A4C EF: 46.5 %

## 2023-03-03 ENCOUNTER — Other Ambulatory Visit: Payer: Self-pay | Admitting: Emergency Medicine

## 2023-03-03 DIAGNOSIS — I340 Nonrheumatic mitral (valve) insufficiency: Secondary | ICD-10-CM

## 2023-03-03 DIAGNOSIS — I5022 Chronic systolic (congestive) heart failure: Secondary | ICD-10-CM

## 2023-03-06 ENCOUNTER — Encounter
Admission: RE | Admit: 2023-03-06 | Discharge: 2023-03-06 | Disposition: A | Discharge status: Home / Self Care | Payer: PPO | Source: Ambulatory Visit | Attending: Cardiovascular Disease | Admitting: Cardiovascular Disease

## 2023-03-06 DIAGNOSIS — I340 Nonrheumatic mitral (valve) insufficiency: Secondary | ICD-10-CM | POA: Diagnosis not present

## 2023-03-06 DIAGNOSIS — I5022 Chronic systolic (congestive) heart failure: Secondary | ICD-10-CM | POA: Diagnosis not present

## 2023-03-06 LAB — NM MYOCAR MULTI W/SPECT W/WALL MOTION / EF
LV dias vol: 137 mL (ref 62–150)
LV sys vol: 61 mL
MPHR: 157 {beats}/min
Nuc Stress EF: 55 %
Peak HR: 61 {beats}/min
Percent HR: 38 %
Rest HR: 57 {beats}/min
Rest Nuclear Isotope Dose: 10.2 mCi
SDS: 0
SRS: 12
SSS: 5
ST Depression (mm): 0 mm
Stress Nuclear Isotope Dose: 29.8 mCi
TID: 1.09

## 2023-03-06 MED ORDER — TECHNETIUM TC 99M TETROFOSMIN IV KIT
10.1900 | PACK | Freq: Once | INTRAVENOUS | Status: AC | PRN
  Administered 2023-03-06: 10.19 via INTRAVENOUS

## 2023-03-06 MED ORDER — TECHNETIUM TC 99M TETROFOSMIN IV KIT
29.8300 | PACK | Freq: Once | INTRAVENOUS | Status: AC | PRN
  Administered 2023-03-06: 29.83 via INTRAVENOUS

## 2023-03-06 MED ORDER — REGADENOSON 0.4 MG/5ML IV SOLN
0.4000 mg | Freq: Once | INTRAVENOUS | Status: AC
  Administered 2023-03-06: 0.4 mg via INTRAVENOUS

## 2023-03-07 ENCOUNTER — Encounter: Payer: Self-pay | Admitting: Cardiovascular Disease

## 2023-03-10 ENCOUNTER — Other Ambulatory Visit: Payer: PPO

## 2023-03-10 ENCOUNTER — Encounter: Payer: Self-pay | Admitting: Emergency Medicine

## 2023-03-12 ENCOUNTER — Emergency Department: Payer: PPO

## 2023-03-12 ENCOUNTER — Emergency Department
Admission: EM | Admit: 2023-03-12 | Discharge: 2023-03-12 | Disposition: A | Discharge status: Home / Self Care | Payer: PPO | Attending: Emergency Medicine | Admitting: Emergency Medicine

## 2023-03-12 ENCOUNTER — Other Ambulatory Visit: Payer: Self-pay

## 2023-03-12 DIAGNOSIS — R220 Localized swelling, mass and lump, head: Secondary | ICD-10-CM | POA: Diagnosis not present

## 2023-03-12 DIAGNOSIS — W010XXA Fall on same level from slipping, tripping and stumbling without subsequent striking against object, initial encounter: Secondary | ICD-10-CM | POA: Insufficient documentation

## 2023-03-12 DIAGNOSIS — M25552 Pain in left hip: Secondary | ICD-10-CM | POA: Diagnosis not present

## 2023-03-12 DIAGNOSIS — Z7901 Long term (current) use of anticoagulants: Secondary | ICD-10-CM | POA: Insufficient documentation

## 2023-03-12 DIAGNOSIS — M545 Low back pain, unspecified: Secondary | ICD-10-CM | POA: Insufficient documentation

## 2023-03-12 DIAGNOSIS — Z981 Arthrodesis status: Secondary | ICD-10-CM | POA: Diagnosis not present

## 2023-03-12 DIAGNOSIS — S7000XA Contusion of unspecified hip, initial encounter: Secondary | ICD-10-CM | POA: Insufficient documentation

## 2023-03-12 DIAGNOSIS — S0101XA Laceration without foreign body of scalp, initial encounter: Secondary | ICD-10-CM | POA: Insufficient documentation

## 2023-03-12 DIAGNOSIS — G8929 Other chronic pain: Secondary | ICD-10-CM | POA: Diagnosis not present

## 2023-03-12 DIAGNOSIS — M160 Bilateral primary osteoarthritis of hip: Secondary | ICD-10-CM | POA: Diagnosis not present

## 2023-03-12 DIAGNOSIS — S0990XA Unspecified injury of head, initial encounter: Secondary | ICD-10-CM | POA: Diagnosis not present

## 2023-03-12 DIAGNOSIS — W19XXXA Unspecified fall, initial encounter: Secondary | ICD-10-CM

## 2023-03-12 DIAGNOSIS — S7002XA Contusion of left hip, initial encounter: Secondary | ICD-10-CM | POA: Diagnosis not present

## 2023-03-12 DIAGNOSIS — S199XXA Unspecified injury of neck, initial encounter: Secondary | ICD-10-CM | POA: Diagnosis not present

## 2023-03-12 DIAGNOSIS — M25512 Pain in left shoulder: Secondary | ICD-10-CM | POA: Diagnosis not present

## 2023-03-12 NOTE — ED Triage Notes (Signed)
Slipped on ice and fell backward, laceration to back of head.  C/O neck and left hip pain.  Abrasions to left hand.  AAOx3.  Skin warm and dry. NAD. Laceration, bleeding controlled.

## 2023-03-12 NOTE — ED Notes (Signed)
Dsd palced over head laceration.

## 2023-03-12 NOTE — ED Provider Notes (Signed)
[facility] Provider Note    Event Date/Time   First MD Initiated Contact with Patient 03/12/23 1714     (approximate)   History   Fall   HPI  Samuel Woods is a 63 y.o. male who presents after a fall.  Patient reports he slipped on the ice, complains of pain in his head, neck shoulder and hip he is on Eliquis.  No neurodeficits reported     Physical Exam   Triage Vital Signs: ED Triage Vitals  Encounter Vitals Group     BP 03/12/23 1429 118/82     Systolic BP Percentile --      Diastolic BP Percentile --      Pulse Rate 03/12/23 1429 (!) 57     Resp 03/12/23 1429 16     Temp 03/12/23 1429 97.8 F (36.6 C)     Temp Source 03/12/23 1429 Oral     SpO2 03/12/23 1429 98 %     Weight 03/12/23 1428 79.2 kg (174 lb 9.7 oz)     Height --      Head Circumference --      Peak Flow --      Pain Score 03/12/23 1427 4     Pain Loc --      Pain Education --      Exclude from Growth Chart --     Most recent vital signs: Vitals:   03/12/23 1429  BP: 118/82  Pulse: (!) 57  Resp: 16  Temp: 97.8 F (36.6 C)  SpO2: 98%     General: Awake, no distress.  CV:  Good peripheral perfusion.  No chest wall tenderness to palpation Resp:  Normal effort.  Abd:  No distention.  Soft, nontender Other:  Posterior scalp laceration, moves all extremities well, neuroexam is normal no pain with axial load on both hips, no vertebral tenderness to palpation   ED Results / Procedures / Treatments   Labs (all labs ordered are listed, but only abnormal results are displayed) Labs Reviewed - No data to display   EKG     RADIOLOGY  Pelvis x-ray viewed interpret by me, no fracture noted   PROCEDURES:  Critical Care performed:   .Laceration Repair  Date/Time: 03/12/2023 6:55 PM  Performed by: Jene Every, MD Authorized by: Jene Every, MD   Consent:    Consent obtained:  Verbal   Consent given by:  Patient   Risks discussed:  Pain, poor  wound healing and infection Anesthesia:    Anesthesia method:  None Laceration details:    Location:  Scalp   Scalp location:  Crown   Length (cm):  3 Treatment:    Area cleansed with:  Chlorhexidine   Amount of cleaning:  Standard Skin repair:    Repair method:  Staples   Number of staples:  4 Approximation:    Approximation:  Close Repair type:    Repair type:  Simple Post-procedure details:    Dressing:  Bulky dressing   Procedure completion:  Tolerated well, no immediate complications    MEDICATIONS ORDERED IN ED: Medications - No data to display   IMPRESSION / MDM / ASSESSMENT AND PLAN / ED COURSE  I reviewed the triage vital signs and the nursing notes. Patient's presentation is most consistent with acute presentation with potential threat to life or bodily function.  Patient presents after slip and fall on Eliquis.  Differential includes head injury, laceration, intracranial hemorrhage, muscle sprain  CT imaging is  reassuring, x-rays negative for fracture. No new/recent neuro deficits to suggest CVA. Most consistent with contusions to the hip/shoulder. Has chronic low back pain not changed.   Laceration stapled, removal of staples in 5-7 days      FINAL CLINICAL IMPRESSION(S) / ED DIAGNOSES   Final diagnoses:  Laceration of scalp, initial encounter  Fall, initial encounter  Contusion of hip, unspecified laterality, initial encounter  Chronic midline low back pain without sciatica     Rx / DC Orders   ED Discharge Orders     None        Note:  This document was prepared using Dragon voice recognition software and may include unintentional dictation errors.   Jene Every, MD 03/12/23 [phone]

## 2023-03-12 NOTE — Discharge Instructions (Addendum)
Staples should be removed in 5-7

## 2023-03-12 NOTE — ED Provider Triage Note (Signed)
Emergency Medicine Provider Triage Evaluation Note  Samuel Woods , a 63 y.o. male  was evaluated in triage.  Pt complains of fall on ice while getting the mail. Hit head, neck pain and shoulder and hip pain.  Patient On Eliquis.    Review of Systems  Positive: Headache, cervical pain, hip and shoulder pain on left Negative: No n/v/ visual changes.  Physical Exam  BP 118/82 (BP Location: Left Arm)   Pulse (!) 57   Temp 97.8 F (36.6 C) (Oral)   Resp 16   Wt 79.2 kg   SpO2 98%   BMI 23.68 kg/m  Gen:   Awake, no distress, alert and talkative Resp:  Normal effort  MSK:   Moves extremities without difficulty  Other:  Laceration to scalp  Medical Decision Making  Medically screening exam initiated at 2:34 PM.  Appropriate orders placed.  DHYAN LENK was informed that the remainder of the evaluation will be completed by another provider, this initial triage assessment does not replace that evaluation, and the importance of remaining in the ED until their evaluation is complete.     Tommi Rumps, PA-C 03/12/23 1439

## 2023-03-13 ENCOUNTER — Telehealth: Payer: Self-pay

## 2023-03-13 NOTE — Transitions of Care (Post Inpatient/ED Visit) (Signed)
03/13/2023  Name: Samuel Woods MRN: [id number] DOB: 1960-03-15  Today's TOC FU Call Status: Today's TOC FU Call Status:: Successful TOC FU Call Completed TOC FU Call Complete Date: 03/13/23 Patient's Name and Date of Birth confirmed.  Transition Care Management Follow-up Telephone Call Date of Discharge: 03/12/23 Discharge Facility: [facility]) Type of Discharge: Emergency Department Reason for ED Visit: Other: (laceration) How have you been since you were released from the hospital?: Better Any questions or concerns?: No  Items Reviewed: Did you receive and understand the discharge instructions provided?: Yes Medications obtained,verified, and reconciled?: Yes (Medications Reviewed) Any new allergies since your discharge?: No Dietary orders reviewed?: Yes Do you have support at home?: Yes People in Home: spouse  Medications Reviewed Today: Medications Reviewed Today     Reviewed by Karena Addison, LPN (Licensed Practical Nurse) on 03/13/23 at 1132  Med List Status: <None>   Medication Order Taking? Sig Documenting Provider Last Dose Status Informant  ALPRAZolam (XANAX) 1 MG tablet [id number]  TAKE 1/2 TO 1 (ONE-HALF TO ONE) TABLET BY MOUTH THREE TIMES DAILY AS NEEDED Malva Limes, MD  Active   amiodarone (PACERONE) 200 MG tablet [id number] No Take 1 tablet by mouth once daily Gollan, Tollie Pizza, MD Taking Active   apixaban (ELIQUIS) 5 MG TABS tablet [id number] No Take 1 tablet by mouth twice daily Malva Limes, MD Taking Active   atorvastatin (LIPITOR) 40 MG tablet [id number]  Take 1 tablet by mouth once daily Antonieta Iba, MD  Active   carvedilol (COREG) 6.25 MG tablet [id number]  Take 1 tablet by mouth twice daily Antonieta Iba, MD  Active   cyclobenzaprine (FLEXERIL) 5 MG tablet [id number] No TAKE 1 TO 2 TABLETS BY MOUTH TWICE DAILY AS NEEDED FOR MUSCLE SPASM Malva Limes, MD Taking Active   ezetimibe (ZETIA) 10 MG tablet  [id number] No Take 1 tablet by mouth once daily Gollan, Tollie Pizza, MD Taking Active   ibuprofen (ADVIL,MOTRIN) 200 MG tablet [id number] No Take 400 mg by mouth every 8 (eight) hours as needed (for pain.).  [provider] Taking Active Self           Med Note Burnett Sheng, Shirl Harris Mar 30, 2018  1:07 PM) At least once daily  metFORMIN (GLUCOPHAGE-XR) 500 MG 24 hr tablet [id number] No Take 1 tablet by mouth once daily with breakfast Malva Limes, MD Taking Active   Multiple Vitamin (MULTIVITAMIN WITH MINERALS) TABS tablet [id number] No Take 1 tablet by mouth daily. Centrum Silver Men 50+ [provider] Taking Active Self  sacubitril-valsartan (ENTRESTO) 49-51 MG [id number] No Take 1 tablet by mouth 2 (two) times daily. Antonieta Iba, MD Taking Active   torsemide (DEMADEX) 5 MG tablet [id number] No TAKE 1 TO 2 TABLETS BY MOUTH ONCE DAILY Malva Limes, MD Taking Active   traZODone (DESYREL) 100 MG tablet [id number] No TAKE 1/2 TO 1 (ONE-HALF TO ONE) TABLET BY MOUTH AT BEDTIME AS NEEDED FOR SLEEP Malva Limes, MD Taking Active             Home Care and Equipment/Supplies: Were Home Health Services Ordered?: NA Any new equipment or medical supplies ordered?: NA  Functional Questionnaire: Do you need assistance with bathing/showering or dressing?: No Do you need assistance with meal preparation?: No Do you need assistance with eating?: No Do you have difficulty maintaining continence: No Do you need assistance with getting out of bed/getting out of a  chair/moving?: No Do you have difficulty managing or taking your medications?: No  Follow up appointments reviewed: PCP Follow-up appointment confirmed?: No (declined appt) MD Provider Line [phone] Given: No Specialist Hospital Follow-up appointment confirmed?: NA Do you need transportation to your follow-up appointment?: No Do you understand care options if your condition(s) worsen?: Yes-patient  verbalized understanding    SIGNATURE Karena Addison, LPN [facility] Nurse Health Advisor Direct Dial [phone]

## 2023-03-20 ENCOUNTER — Other Ambulatory Visit: Payer: Self-pay | Admitting: Family Medicine

## 2023-03-20 DIAGNOSIS — E119 Type 2 diabetes mellitus without complications: Secondary | ICD-10-CM

## 2023-03-21 NOTE — Telephone Encounter (Signed)
Requested medication (s) are due for refill today:   Yes  Requested medication (s) are on the active medication list:   Yes  Future visit scheduled:   05/27/2023 AWV   Last ordered: 12/23/2022 #90, 0 refills  Unable to refill because all labs are expired per protocol.      Requested Prescriptions  Pending Prescriptions Disp Refills   metFORMIN (GLUCOPHAGE-XR) 500 MG 24 hr tablet [Pharmacy Med Name: metFORMIN HCl ER 500 MG Oral Tablet Extended Release 24 Hour] 90 tablet 0    Sig: Take 1 tablet by mouth once daily with breakfast     Endocrinology:  Diabetes - Biguanides Failed - 03/21/2023 10:18 AM      Failed - Cr in normal range and within 360 days    Creatinine, Ser  Date Value Ref Range Status  10/08/2021 1.13 0.76 - 1.27 mg/dL Final         Failed - HBA1C is between 0 and 7.9 and within 180 days    Hgb A1c MFr Bld  Date Value Ref Range Status  02/22/2022 6.2 (H) 4.8 - 5.6 % Final    Comment:             Prediabetes: 5.7 - 6.4          Diabetes: >6.4          Glycemic control for adults with diabetes: <7.0          Failed - eGFR in normal range and within 360 days    GFR calc Af Amer  Date Value Ref Range Status  12/20/2019 70 >59 mL/min/1.73 Final    Comment:    **In accordance with recommendations from the NKF-ASN Task force,**   [facility] is in the process of updating its eGFR calculation to the   2021 CKD-EPI creatinine equation that estimates kidney function   without a race variable.    GFR calc non Af Amer  Date Value Ref Range Status  12/20/2019 60 >59 mL/min/1.73 Final   eGFR  Date Value Ref Range Status  10/08/2021 73 >59 mL/min/1.73 Final         Failed - B12 Level in normal range and within 720 days    No results found for: "VITAMINB12"       Failed - Valid encounter within last 6 months    Recent Outpatient Visits           6 months ago Low back pain without sciatica   [facility] Malva Limes, MD   1 year ago  Controlled type 2 diabetes mellitus without complication, without long-term current use of insulin (HCC)   [facility] Malva Limes, MD   1 year ago Controlled type 2 diabetes mellitus without complication, without long-term current use of insulin (HCC)   [facility] Malva Limes, MD   1 year ago Annual physical exam   [facility] Malva Limes, MD   2 years ago Controlled type 2 diabetes mellitus without complication, without long-term current use of insulin [facility])   [facility] Malva Limes, MD              Failed - CBC within normal limits and completed in the last 12 months    WBC  Date Value Ref Range Status  05/08/2021 7.2 3.4 - 10.8 x10E3/uL Final  07/06/2017 9.4 3.8 - 10.6 K/uL Final   RBC  Date Value Ref Range Status  05/08/2021 5.03 4.14 - 5.80 x10E6/uL Final  07/06/2017 5.32 4.40 - 5.90 MIL/uL Final   Hemoglobin  Date Value Ref Range Status  05/08/2021 15.3 13.0 - 17.7 g/dL Final   Hematocrit  Date Value Ref Range Status  05/08/2021 45.2 37.5 - 51.0 % Final   MCHC  Date Value Ref Range Status  05/08/2021 33.8 31.5 - 35.7 g/dL Final  09/81/1914 78.2 32.0 - 36.0 g/dL Final   [facility]  Date Value Ref Range Status  05/08/2021 30.4 26.6 - 33.0 pg Final  07/06/2017 31.2 26.0 - 34.0 pg Final   MCV  Date Value Ref Range Status  05/08/2021 90 79 - 97 fL Final   No results found for: "PLTCOUNTKUC", "LABPLAT", "POCPLA" RDW  Date Value Ref Range Status  05/08/2021 13.1 11.6 - 15.4 % Final

## 2023-03-24 ENCOUNTER — Telehealth: Payer: Self-pay | Admitting: Cardiovascular Disease

## 2023-03-24 NOTE — Telephone Encounter (Signed)
Pt c/o medication issue:  1. Name of Medication: sacubitril-valsartan (ENTRESTO) 49-51 MG   2. How are you currently taking this medication (dosage and times per day)? 1 tablet, 2 times daily   3. Are you having a reaction (difficulty breathing--STAT)? No  4. What is your medication issue? Pt wants to re-evaluate pt assist for [facility], requesting cb

## 2023-03-25 NOTE — Telephone Encounter (Signed)
 Spoke with patient and reviewed medications, necessary documentation, and application requirements required for both Eliquis  and Entresto . Also discussed extra help available through [facility] and insurance company options. He will get all documentation together and come by the office to bring that in and to SIGN paperwork the end of this week. He verbalized understanding of our conversation with no further questions for now.

## 2023-04-08 ENCOUNTER — Other Ambulatory Visit: Payer: Self-pay | Admitting: Family Medicine

## 2023-04-08 DIAGNOSIS — F419 Anxiety disorder, unspecified: Secondary | ICD-10-CM

## 2023-04-26 ENCOUNTER — Other Ambulatory Visit: Payer: Self-pay | Admitting: Cardiovascular Disease

## 2023-04-29 ENCOUNTER — Telehealth: Payer: Self-pay | Admitting: Pharmacy Technician

## 2023-04-29 ENCOUNTER — Other Ambulatory Visit (HOSPITAL_COMMUNITY): Payer: Self-pay

## 2023-04-29 NOTE — Telephone Encounter (Signed)
 Scan in media of patient assistance. Pending out of pocket expense report

## 2023-05-06 ENCOUNTER — Encounter: Payer: Self-pay | Admitting: Pharmacy Technician

## 2023-05-06 NOTE — Telephone Encounter (Signed)
 Sent the patient a mychart message for a follow up since we have not heard back about their application missing out of pocket expense report

## 2023-05-11 ENCOUNTER — Other Ambulatory Visit: Payer: Self-pay | Admitting: Family Medicine

## 2023-05-11 DIAGNOSIS — F419 Anxiety disorder, unspecified: Secondary | ICD-10-CM

## 2023-05-12 NOTE — Telephone Encounter (Signed)
 Requested medication (s) are due for refill today: Yes  Requested medication (s) are on the active medication list: Yes  Last refill:  04/09/23  Future visit scheduled: Yes  Notes to clinic:  Unable to refill per protocol, cannot delegate.      Requested Prescriptions  Pending Prescriptions Disp Refills   ALPRAZolam (XANAX) 1 MG tablet [Pharmacy Med Name: ALPRAZolam 1 MG Oral Tablet] 90 tablet 0    Sig: TAKE 1/2 TO 1 (ONE-HALF TO ONE) TABLET BY MOUTH THREE TIMES DAILY AS NEEDED . APPOINTMENT REQUIRED FOR FUTURE REFILLS     Not Delegated - Psychiatry: Anxiolytics/Hypnotics 2 Failed - 05/12/2023  4:25 PM      Failed - This refill cannot be delegated      Failed - Urine Drug Screen completed in last 360 days      Failed - Valid encounter within last 6 months    Recent Outpatient Visits           8 months ago Low back pain without sciatica   [facility] Malva Limes, MD   1 year ago Controlled type 2 diabetes mellitus without complication, without long-term current use of insulin (HCC)   [facility] Malva Limes, MD   1 year ago Controlled type 2 diabetes mellitus without complication, without long-term current use of insulin (HCC)   [facility], [facility]   2 years ago Annual physical exam   [facility] Malva Limes, MD   2 years ago Controlled type 2 diabetes mellitus without complication, without long-term current use of insulin [facility])   [facility] Sherrie Mustache, Demetrios Isaacs, MD              Passed - Patient is not pregnant

## 2023-05-13 NOTE — Telephone Encounter (Signed)
 Patient called back and said he will drop off the expense report at the office tomorrow

## 2023-05-13 NOTE — Telephone Encounter (Signed)
 Called the patient to follow up since we have not heard back about their application  and he has not read the mychart message from last time

## 2023-05-15 ENCOUNTER — Other Ambulatory Visit (HOSPITAL_COMMUNITY): Payer: Self-pay

## 2023-05-15 NOTE — Telephone Encounter (Signed)
 PAP: Application for Eliquis has been submitted to [employer] ([employer]), via fax

## 2023-05-22 NOTE — Telephone Encounter (Signed)
 PAP: Patient has been denied for patient assistance by [employer] ([employer]) due to he has not met the 3 percent out of pocket expense report based on household income  . I will scan in media the denial once received from [employer], they verbally told me denied. I left the patient a message to call me back so I can let him know.    Hi and Thank you!

## 2023-05-22 NOTE — Telephone Encounter (Signed)
 PAP: Patient has been denied for patient assistance by [employer] ([employer]) due to he has not met the 3 percent out of pocket expense report based on household income  . I will scan in media the denial once received from [employer], they verbally told me denied. I left the patient a message to call me back so I can let him know.

## 2023-05-27 ENCOUNTER — Ambulatory Visit: Payer: Self-pay

## 2023-05-27 VITALS — BP 116/70 | Ht 72.0 in | Wt 180.1 lb

## 2023-05-27 DIAGNOSIS — Z7984 Long term (current) use of oral hypoglycemic drugs: Secondary | ICD-10-CM

## 2023-05-27 DIAGNOSIS — Z0001 Encounter for general adult medical examination with abnormal findings: Secondary | ICD-10-CM | POA: Diagnosis not present

## 2023-05-27 DIAGNOSIS — Z0000 Encounter for general adult medical examination without abnormal findings: Secondary | ICD-10-CM

## 2023-05-27 DIAGNOSIS — E119 Type 2 diabetes mellitus without complications: Secondary | ICD-10-CM

## 2023-05-27 NOTE — Patient Instructions (Addendum)
 Samuel Woods , Thank you for taking time to come for your Medicare Wellness Visit. I appreciate your ongoing commitment to your health goals. Please review the following plan we discussed and let me know if I can assist you in the future.   Referrals/Orders/Follow-Ups/Clinician Recommendations: LABS ORDERED  This is a list of the screening recommended for you and due dates:  Health Maintenance  Topic Date Due   Complete foot exam   Never done   HIV Screening  Never done   Zoster (Shingles) Vaccine (1 of 2) Never done   Pneumococcal Vaccination (2 of 2 - PCV) 01/21/2010   Screening for Lung Cancer  05/24/2010   Hemoglobin A1C  08/23/2022   Yearly kidney function blood test for diabetes  10/09/2022   COVID-19 Vaccine (3 - 2024-25 season) 10/20/2022   Eye exam for diabetics  02/14/2023   Yearly kidney health urinalysis for diabetes  02/23/2023   Flu Shot  09/19/2023   Medicare Annual Wellness Visit  05/26/2024   Colon Cancer Screening  05/05/2025   DTaP/Tdap/Td vaccine (3 - Td or Tdap) 05/10/2031   Hepatitis C Screening  Completed   HPV Vaccine  Aged Out    Advanced directives: (ACP Link)Information on Advanced Care Planning can be found at [facility] Advance Health Care Directives Advance Health Care Directives. [email or web address]   Next Medicare Annual Wellness Visit scheduled for next year: Yes   06/01/24 @ 1:50 PM IN PERSON

## 2023-05-27 NOTE — Progress Notes (Signed)
 Subjective:   Samuel Woods is a 63 y.o. who presents for a Medicare Wellness preventive visit.  Visit Complete: In person   Persons Participating in Visit: Patient.  AWV Questionnaire: No: Patient Medicare AWV questionnaire was not completed prior to this visit.  Cardiac Risk Factors include: advanced age (>20men, >82 women);diabetes mellitus;dyslipidemia;male gender;hypertension     Objective:    Today's Vitals   05/27/23 1506 05/27/23 1510  BP: 116/70   Weight: 180 lb 1.6 oz (81.7 kg)   Height: 6' (1.829 m)   PainSc:  4    Body mass index is 24.43 kg/m.     05/27/2023    3:19 PM 03/12/2023    2:29 PM 05/21/2022    3:53 PM 05/05/2020    8:36 AM 11/17/2017    7:04 AM 07/06/2017    5:04 PM 07/06/2017    1:47 PM  Advanced Directives  Does Patient Have a Medical Advance Directive? No No No No Yes Yes No  Type of Advance Directive     Living will Living will;Healthcare Power of Attorney   Does patient want to make changes to medical advance directive?     No - Patient declined No - Patient declined   Copy of Healthcare Power of Attorney in Chart?      Yes   Would patient like information on creating a medical advance directive? No - Patient declined No - Patient declined         Current Medications (verified) Outpatient Encounter Medications as of 05/27/2023  Medication Sig   ALPRAZolam (XANAX) 1 MG tablet TAKE 1/2 TO 1 (ONE-HALF TO ONE) TABLET BY MOUTH THREE TIMES DAILY AS NEEDED . APPOINTMENT REQUIRED FOR FUTURE REFILLS   amiodarone (PACERONE) 200 MG tablet Take 1 tablet by mouth once daily   apixaban (ELIQUIS) 5 MG TABS tablet Take 1 tablet by mouth twice daily   atorvastatin (LIPITOR) 40 MG tablet Take 1 tablet by mouth once daily   carvedilol (COREG) 6.25 MG tablet Take 1 tablet by mouth twice daily   cyclobenzaprine (FLEXERIL) 5 MG tablet TAKE 1 TO 2 TABLETS BY MOUTH TWICE DAILY AS NEEDED FOR MUSCLE SPASM   ezetimibe (ZETIA) 10 MG tablet Take 1 tablet by mouth once  daily   metFORMIN (GLUCOPHAGE-XR) 500 MG 24 hr tablet Take 1 tablet by mouth once daily with breakfast   Multiple Vitamin (MULTIVITAMIN WITH MINERALS) TABS tablet Take 1 tablet by mouth daily. Centrum Silver Men 50+   sacubitril-valsartan (ENTRESTO) 49-51 MG Take 1 tablet by mouth 2 (two) times daily.   torsemide (DEMADEX) 5 MG tablet TAKE 1 TO 2 TABLETS BY MOUTH ONCE DAILY   ibuprofen (ADVIL,MOTRIN) 200 MG tablet Take 400 mg by mouth every 8 (eight) hours as needed (for pain.).  (Patient not taking: Reported on 05/27/2023)   traZODone (DESYREL) 100 MG tablet TAKE 1/2 TO 1 (ONE-HALF TO ONE) TABLET BY MOUTH AT BEDTIME AS NEEDED FOR SLEEP (Patient not taking: Reported on 05/27/2023)   No facility-administered encounter medications on file as of 05/27/2023.    Allergies (verified) Dilaudid [hydromorphone hcl]   History: Past Medical History:  Diagnosis Date   Afib (HCC)    Dysrhythmia    History of chicken pox    History of lung abscess 01/18/2009   History of measles    Hyperlipidemia    Hypertension    Past Surgical History:  Procedure Laterality Date   CARDIOVERSION Right 11/17/2017   Procedure: CARDIOVERSION;  Surgeon: Laurier Nancy, MD;  Location: [facility] ORS;  Service: Cardiovascular;  Laterality: Right;   COLONOSCOPY WITH PROPOFOL N/A 05/05/2020   Procedure: COLONOSCOPY WITH PROPOFOL;  Surgeon: Wyline Mood, MD;  Location: [facility] ENDOSCOPY;  Service: Gastroenterology;  Laterality: N/A;   DOPPLER ECHOCARDIOGRAPHY  08/12/2007   Mild to moderate stenosis. Trace TR.Mild pulmonary hypertension. Borderline LVH. Mild depressed right systolic function. LVEF=50-55%   MITRAL VALVE REPAIR  05/2002   SPINAL FUSION     TEE WITHOUT CARDIOVERSION N/A 11/17/2017   Procedure: TRANSESOPHAGEAL ECHOCARDIOGRAM (TEE);  Surgeon: Laurier Nancy, MD;  Location: [facility] ORS;  Service: Cardiovascular;  Laterality: N/A;   Family History  Problem Relation Age of Onset   Alcohol abuse Father    Hypertension Brother     Cancer Other    Diabetes Neg Hx    Social History   Socioeconomic History   Marital status: Married    Spouse name: Not on file   Number of children: 2   Years of education: Not on file   Highest education level: Some college, no degree  Occupational History   Occupation: Seasonal outside Higher education careers adviser: [employer]  Tobacco Use   Smoking status: Some Days    Current packs/day: 0.00    Average packs/day: 0.5 packs/day for 15.0 years (7.5 ttl pk-yrs)    Types: Cigarettes, E-cigarettes    Start date: 07/20/2002    Last attempt to quit: 07/19/2017    Years since quitting: 5.8   Smokeless tobacco: Never   Tobacco comments:    stopped smoking 10/2017. I vape sometimes.  Vaping Use   Vaping status: Never Used  Substance and Sexual Activity   Alcohol use: Not Currently    Comment: occasional use   Drug use: Yes    Types: Marijuana    Comment: gummie   Sexual activity: Not on file  Other Topics Concern   Not on file  Social History Narrative   Not on file   Social Drivers of Health   Financial Resource Strain: Low Risk  (05/27/2023)   Overall Financial Resource Strain (CARDIA)    Difficulty of Paying Living Expenses: Not hard at all  Food Insecurity: No Food Insecurity (05/27/2023)   Hunger Vital Sign    Worried About Running Out of Food in the Last Year: Never true    Ran Out of Food in the Last Year: Never true  Transportation Needs: No Transportation Needs (05/27/2023)   PRAPARE - Administrator, Civil Service (Medical): No    Lack of Transportation (Non-Medical): No  Physical Activity: Insufficiently Active (05/27/2023)   Exercise Vital Sign    Days of Exercise per Week: 3 days    Minutes of Exercise per Session: 30 min  Stress: No Stress Concern Present (05/27/2023)   [employer] of Occupational Health - Occupational Stress Questionnaire    Feeling of Stress : Only a little  Social Connections: Moderately Isolated (05/27/2023)    Social Connection and Isolation Panel [NHANES]    Frequency of Communication with Friends and Family: More than three times a week    Frequency of Social Gatherings with Friends and Family: Once a week    Attends Religious Services: Never    Database administrator or Organizations: No    Attends Banker Meetings: Never    Marital Status: Married    Tobacco Counseling Ready to quit: Not Answered Counseling given: Not Answered Tobacco comments: stopped smoking 10/2017. I vape sometimes.    Clinical  Intake:  Pre-visit preparation completed: Yes  Pain : 0-10 Pain Score: 4  Pain Type: Chronic pain Pain Location: Back Pain Orientation: Lower Pain Descriptors / Indicators: Aching, Constant, Throbbing Pain Onset: More than a month ago Pain Frequency: Intermittent Pain Relieving Factors: MUSCLE RELAXERS  Pain Relieving Factors: MUSCLE RELAXERS  BMI - recorded: 24.43 Nutritional Status: BMI of 19-24  Normal Nutritional Risks: None Diabetes: Yes CBG done?: No Did pt. bring in CBG monitor from home?: No  Lab Results  Component Value Date   HGBA1C 6.2 (H) 02/22/2022   HGBA1C 5.9 (A) 10/05/2021   HGBA1C 6.6 (H) 05/08/2021     How often do you need to have someone help you when you read instructions, pamphlets, or other written materials from your doctor or pharmacy?: 1 - Never  Interpreter Needed?: No  Information entered by :: Kennedy Bucker, LPN   Activities of Daily Living    05/27/2023    3:21 PM  In your present state of health, do you have any difficulty performing the following activities:  Hearing? 0  Vision? 0  Difficulty concentrating or making decisions? 0  Walking or climbing stairs? 1  Comment GETS WINDED  Dressing or bathing? 0  Doing errands, shopping? 0  Preparing Food and eating ? N  Using the Toilet? N  In the past six months, have you accidently leaked urine? N  Do you have problems with loss of bowel control? N  Managing your  Medications? N  Managing your Finances? N  Housekeeping or managing your Housekeeping? N    Patient Care Team: Malva Limes, MD as PCP - General (Family Medicine) Mariah Milling Tollie Pizza, MD as Consulting Physician (Cardiology) Pa, [facility] Nancie Neas, [place] (Orthopedic Surgery) Lou Cal, MD as Referring Physician (Orthopedic Surgery)  Indicate any recent Medical Services you may have received from other than [facility] providers in the past year (date may be approximate).     Assessment:   This is a routine wellness examination for Samuel Woods.  Hearing/Vision screen Hearing Screening - Comments:: NO AIDS Vision Screening - Comments:: WEARS GLASSES ALL THE TIME- PATTY VISION   Goals Addressed             This Visit's Progress    DIET - EAT MORE FRUITS AND VEGETABLES         Depression Screen     05/27/2023    3:17 PM 05/21/2022    3:46 PM 02/22/2022    4:22 PM 10/05/2021    4:10 PM 05/08/2021    1:54 PM 11/07/2020    3:09 PM 07/11/2020    4:18 PM  PHQ 2/9 Scores  PHQ - 2 Score 1 0 0 2 2 3 1   PHQ- 9 Score 1  0 5 9 9 5     Fall Risk     05/27/2023    3:20 PM 05/21/2022    3:43 PM 02/22/2022    4:21 PM 10/05/2021    4:10 PM 11/07/2020    3:08 PM  Fall Risk   Falls in the past year? 1 0 1 1 1   Number falls in past yr: 1 0 0 0 1  Injury with Fall? 1 0 0 0 1  Risk for fall due to : Impaired balance/gait No Fall Risks History of fall(s) History of fall(s) History of fall(s);Impaired balance/gait  Follow up Falls evaluation completed;Falls prevention discussed Education provided;Falls prevention discussed Falls evaluation completed Falls evaluation completed Falls evaluation completed    MEDICARE RISK  AT HOME:  Medicare Risk at Home Any stairs in or around the home?: Yes If so, are there any without handrails?: No Home free of loose throw rugs in walkways, pet beds, electrical cords, etc?: Yes Adequate lighting in your home to reduce risk of falls?: Yes Life alert?:  No Use of a cane, walker or w/c?: Yes (QUAD CANE OCCASIONALLY WHEN BACK HURTS BAD) Grab bars in the bathroom?: Yes Shower chair or bench in shower?: No Elevated toilet seat or a handicapped toilet?: Yes  TIMED UP AND GO:  Was the test performed?  Yes  Length of time to ambulate 10 feet: 4 sec Gait steady and fast without use of assistive device  Cognitive Function: 6CIT completed        05/27/2023    3:24 PM 05/21/2022    3:58 PM  6CIT Screen  What Year? 0 points 0 points  What month? 0 points 0 points  What time? 0 points 0 points  Count back from 20 0 points 0 points  Months in reverse 0 points 0 points  Repeat phrase 0 points 0 points  Total Score 0 points 0 points    Immunizations Immunization History  Administered Date(s) Administered   Influenza Inj Mdck Quad Pf 12/27/2016, 10/28/2017   Influenza Split 12/13/2005, 11/16/2009, 11/19/2011   Influenza, Seasonal, Injecte, Preservative Fre 11/20/2022   Influenza,inj,Quad PF,6+ Mos 12/12/2014, 12/13/2015, 10/12/2018, 12/28/2019, 11/07/2020   Influenza-Unspecified 12/27/2016   Janssen (J&J) SARS-COV-2 Vaccination 07/24/2019   Moderna Sars-Covid-2 Vaccination 01/26/2020   Pneumococcal Polysaccharide-23 01/21/2009   Tdap 07/16/2007, 05/09/2021    Screening Tests Health Maintenance  Topic Date Due   FOOT EXAM  Never done   HIV Screening  Never done   Zoster Vaccines- Shingrix (1 of 2) Never done   Pneumococcal Vaccine 19-48 Years old (2 of 2 - PCV) 01/21/2010   Lung Cancer Screening  05/24/2010   HEMOGLOBIN A1C  08/23/2022   Diabetic kidney evaluation - eGFR measurement  10/09/2022   COVID-19 Vaccine (3 - 2024-25 season) 10/20/2022   OPHTHALMOLOGY EXAM  02/14/2023   Diabetic kidney evaluation - Urine ACR  02/23/2023   INFLUENZA VACCINE  09/19/2023   Medicare Annual Wellness (AWV)  05/26/2024   Colonoscopy  05/05/2025   DTaP/Tdap/Td (3 - Td or Tdap) 05/10/2031   Hepatitis C Screening  Completed   HPV VACCINES  Aged  Out    Health Maintenance  Health Maintenance Due  Topic Date Due   FOOT EXAM  Never done   HIV Screening  Never done   Zoster Vaccines- Shingrix (1 of 2) Never done   Pneumococcal Vaccine 35-78 Years old (2 of 2 - PCV) 01/21/2010   Lung Cancer Screening  05/24/2010   HEMOGLOBIN A1C  08/23/2022   Diabetic kidney evaluation - eGFR measurement  10/09/2022   COVID-19 Vaccine (3 - 2024-25 season) 10/20/2022   OPHTHALMOLOGY EXAM  02/14/2023   Diabetic kidney evaluation - Urine ACR  02/23/2023   Health Maintenance Items Addressed: Labs Ordered:- NEEDS EYE EXAM- HAD FOOT EXAM LAST YEAR(FAXED REQUEST FOR RECORD)- UP TO DATE ON SHOTS DECLINES PNA- UP TO DATE W/ COLONOSCOPY  Additional Screening:  Vision Screening: Recommended annual ophthalmology exams for early detection of glaucoma and other disorders of the eye.  Dental Screening: Recommended annual dental exams for proper oral hygiene  Community Resource Referral / Chronic Care Management: CRR required this visit?  No   CCM required this visit?  No     Plan:  I have personally reviewed and noted the following in the patient's chart:   Medical and social history Use of alcohol, tobacco or illicit drugs  Current medications and supplements including opioid prescriptions. Patient is not currently taking opioid prescriptions. Functional ability and status Nutritional status Physical activity Advanced directives List of other physicians Hospitalizations, surgeries, and ER visits in previous 12 months Vitals Screenings to include cognitive, depression, and falls Referrals and appointments  In addition, I have reviewed and discussed with patient certain preventive protocols, quality metrics, and best practice recommendations. A written personalized care plan for preventive services as well as general preventive health recommendations were provided to patient.     Hal Hope, LPN   4/0/9811   After Visit Summary:  (In Person-Declined) Patient declined AVS at this time.  Notes:  LABS ORDERED

## 2023-05-30 ENCOUNTER — Other Ambulatory Visit: Payer: Self-pay | Admitting: Cardiovascular Disease

## 2023-06-04 ENCOUNTER — Encounter: Payer: Self-pay | Admitting: Family Medicine

## 2023-06-04 ENCOUNTER — Ambulatory Visit: Admitting: Family Medicine

## 2023-06-04 VITALS — BP 126/76 | HR 60 | Resp 16 | Ht 72.0 in | Wt 178.0 lb

## 2023-06-04 DIAGNOSIS — I10 Essential (primary) hypertension: Secondary | ICD-10-CM

## 2023-06-04 DIAGNOSIS — Z0001 Encounter for general adult medical examination with abnormal findings: Secondary | ICD-10-CM

## 2023-06-04 DIAGNOSIS — Z23 Encounter for immunization: Secondary | ICD-10-CM

## 2023-06-04 DIAGNOSIS — I4891 Unspecified atrial fibrillation: Secondary | ICD-10-CM

## 2023-06-04 DIAGNOSIS — M545 Low back pain, unspecified: Secondary | ICD-10-CM | POA: Diagnosis not present

## 2023-06-04 DIAGNOSIS — E782 Mixed hyperlipidemia: Secondary | ICD-10-CM | POA: Diagnosis not present

## 2023-06-04 DIAGNOSIS — E119 Type 2 diabetes mellitus without complications: Secondary | ICD-10-CM

## 2023-06-04 DIAGNOSIS — D689 Coagulation defect, unspecified: Secondary | ICD-10-CM

## 2023-06-04 DIAGNOSIS — Z125 Encounter for screening for malignant neoplasm of prostate: Secondary | ICD-10-CM

## 2023-06-04 DIAGNOSIS — Z0000 Encounter for general adult medical examination without abnormal findings: Secondary | ICD-10-CM

## 2023-06-04 DIAGNOSIS — E1159 Type 2 diabetes mellitus with other circulatory complications: Secondary | ICD-10-CM

## 2023-06-04 DIAGNOSIS — F419 Anxiety disorder, unspecified: Secondary | ICD-10-CM

## 2023-06-04 DIAGNOSIS — Z7984 Long term (current) use of oral hypoglycemic drugs: Secondary | ICD-10-CM | POA: Diagnosis not present

## 2023-06-04 MED ORDER — CYCLOBENZAPRINE HCL 10 MG PO TABS: 5.0000 mg | ORAL_TABLET | Freq: Three times a day (TID) | ORAL | 3 refills | Status: DC | PRN

## 2023-06-05 ENCOUNTER — Encounter: Payer: Self-pay | Admitting: Family Medicine

## 2023-06-05 LAB — COMPREHENSIVE METABOLIC PANEL WITH GFR
ALT: 32 IU/L (ref 0–44)
AST: 24 IU/L (ref 0–40)
Albumin: 4.7 g/dL (ref 3.9–4.9)
Alkaline Phosphatase: 91 IU/L (ref 44–121)
BUN/Creatinine Ratio: 12 (ref 10–24)
BUN: 14 mg/dL (ref 8–27)
Bilirubin Total: 0.8 mg/dL (ref 0.0–1.2)
CO2: 23 mmol/L (ref 20–29)
Calcium: 9.8 mg/dL (ref 8.6–10.2)
Chloride: 103 mmol/L (ref 96–106)
Creatinine, Ser: 1.14 mg/dL (ref 0.76–1.27)
Globulin, Total: 2.3 g/dL (ref 1.5–4.5)
Glucose: 124 mg/dL — ABNORMAL HIGH (ref 70–99)
Potassium: 4.5 mmol/L (ref 3.5–5.2)
Sodium: 141 mmol/L (ref 134–144)
Total Protein: 7 g/dL (ref 6.0–8.5)
eGFR: 72 mL/min/{1.73_m2} (ref >59)

## 2023-06-05 LAB — LIPID PANEL
Chol/HDL Ratio: 3.1 ratio (ref 0.0–5.0)
Cholesterol, Total: 138 mg/dL (ref 100–199)
HDL: 44 mg/dL (ref >39)
LDL Chol Calc (NIH): 72 mg/dL (ref 0–99)
Triglycerides: 121 mg/dL (ref 0–149)
VLDL Cholesterol Cal: 22 mg/dL (ref 5–40)

## 2023-06-05 LAB — CBC
Hematocrit: 44.1 % (ref 37.5–51.0)
Hemoglobin: 15.3 g/dL (ref 13.0–17.7)
MCH: 30.5 pg (ref 26.6–33.0)
MCHC: 34.7 g/dL (ref 31.5–35.7)
MCV: 88 fL (ref 79–97)
Platelets: 318 10*3/uL (ref 150–450)
RBC: 5.02 x10E6/uL (ref 4.14–5.80)
RDW: 12.6 % (ref 11.6–15.4)
WBC: 9.6 10*3/uL (ref 3.4–10.8)

## 2023-06-05 LAB — HEMOGLOBIN A1C
Est. average glucose Bld gHb Est-mCnc: 131 mg/dL
Hgb A1c MFr Bld: 6.2 % — ABNORMAL HIGH (ref 4.8–5.6)

## 2023-06-05 LAB — MICROALBUMIN / CREATININE URINE RATIO
Creatinine, Urine: 97.3 mg/dL
Microalb/Creat Ratio: 24 mg/g{creat} (ref 0–29)
Microalbumin, Urine: 22.9 ug/mL

## 2023-06-05 LAB — PSA TOTAL (REFLEX TO FREE): Prostate Specific Ag, Serum: 3.2 ng/mL (ref 0.0–4.0)

## 2023-06-06 MED ORDER — ALPRAZOLAM 1 MG PO TABS
ORAL_TABLET | ORAL | 5 refills | Status: DC
Start: 2023-06-06 — End: 2023-12-08

## 2023-06-06 NOTE — Progress Notes (Unsigned)
 Complete physical exam   Patient: Samuel Woods   DOB: 1960/04/10   63 y.o. Male  MRN: [id number] Visit Date: 06/04/2023  Today's healthcare provider: Jeralene Mom, MD   Chief Complaint  Patient presents with   Annual Exam   Diabetes   Hypertension   Hyperlipidemia   Back Pain   Subjective    Discussed the use of AI scribe software for clinical note transcription with the patient, who gave verbal consent to proceed.  History of Present Illness   Samuel Woods is a 63 year old male who presents for a Medicare checkup and to discuss ongoing back pain.  He experiences persistent back pain that has not improved despite undergoing an MRI and receiving four injections. The MRI did not reveal any issues requiring surgery. He finds some relief with cyclobenzaprine , a muscle relaxer, but notes that he runs out of the medication too quickly. He takes it especially at bedtime to help with the pain. He has switched from ibuprofen to Tylenol  for pain management, although he finds it less effective.   He continues on metformin  for type 2 diabetes which he is tolerating well.   He states he quit smoking 11 or 12 years ago and does not currently smoke. He has not seen an eye doctor this year, although he did last year. He is dissatisfied with his previous optometrist due to delays in receiving his glasses.  He is currently taking Entresto  and torsemide , the latter of which he takes once daily in the morning to avoid nocturia. He also takes Xanax  up to three times a day for anxiety, typically in the morning, around noon, and before bed. His prescription was reduced to a 60-day supply because he needed to see the doctor.  He has a history of sensitivity in his right ear following exposure to a loud noise at a shooting range without ear protection. Loud noises cause discomfort, although he does not believe the ear is damaged. He also mentions that his children have frequent earwax buildup, but he  does not.  He experiences sinus or allergy symptoms primarily in the mornings, requiring him to carry tissues. He occasionally experiences crying spells that last a couple of months.  No stomach problems, cramping, or changes in bowel habits. He maintains a regular diet that includes Raisin Bran every morning.         Past Medical History:  Diagnosis Date   Afib (HCC)    Dysrhythmia    History of chicken pox    History of lung abscess 01/18/2009   History of measles    Hyperlipidemia    Hypertension    Past Surgical History:  Procedure Laterality Date   CARDIOVERSION Right 11/17/2017   Procedure: CARDIOVERSION;  Surgeon: Cherrie Cornwall, MD;  Location: [facility];  Service: Cardiovascular;  Laterality: Right;   COLONOSCOPY WITH PROPOFOL  N/A 05/05/2020   Procedure: COLONOSCOPY WITH PROPOFOL ;  Surgeon: Luke Salaam, MD;  Location: [facility];  Service: Gastroenterology;  Laterality: N/A;   DOPPLER ECHOCARDIOGRAPHY  08/12/2007   Mild to moderate stenosis. Trace TR.Mild pulmonary hypertension. Borderline LVH. Mild depressed right systolic function. LVEF=50-55%   MITRAL VALVE REPAIR  05/2002   SPINAL FUSION     TEE WITHOUT CARDIOVERSION N/A 11/17/2017   Procedure: TRANSESOPHAGEAL ECHOCARDIOGRAM (TEE);  Surgeon: Cherrie Cornwall, MD;  Location: [facility];  Service: Cardiovascular;  Laterality: N/A;   Social History   Socioeconomic History   Marital status: Married    Spouse name:  Not on file   Number of children: 2   Years of education: Not on file   Highest education level: Some college, no degree  Occupational History   Occupation: Seasonal outside power equipment    Employer: [employer]  Tobacco Use   Smoking status: Former    Current packs/day: 0.00    Average packs/day: 0.5 packs/day for 15.0 years (7.5 ttl pk-yrs)    Types: Cigarettes, E-cigarettes    Start date: 07/20/2002    Quit date: 07/19/2017    Years since quitting: 5.8   Smokeless tobacco: Never    Tobacco comments:    stopped smoking 10/2017. I vape sometimes.  Vaping Use   Vaping status: Never Used  Substance and Sexual Activity   Alcohol use: Not Currently    Comment: occasional use   Drug use: Yes    Types: Marijuana    Comment: gummie   Sexual activity: Not on file  Other Topics Concern   Not on file  Social History Narrative   Not on file   Social Drivers of Health   Financial Resource Strain: Low Risk  (05/27/2023)   Overall Financial Resource Strain (CARDIA)    Difficulty of Paying Living Expenses: Not hard at all  Food Insecurity: No Food Insecurity (05/27/2023)   Hunger Vital Sign    Worried About Running Out of Food in the Last Year: Never true    Ran Out of Food in the Last Year: Never true  Transportation Needs: No Transportation Needs (05/27/2023)   PRAPARE - Administrator, Civil Service (Medical): No    Lack of Transportation (Non-Medical): No  Physical Activity: Insufficiently Active (05/27/2023)   Exercise Vital Sign    Days of Exercise per Week: 3 days    Minutes of Exercise per Session: 30 min  Stress: No Stress Concern Present (05/27/2023)   [employer] of Occupational Health - Occupational Stress Questionnaire    Feeling of Stress : Only a little  Social Connections: Moderately Isolated (05/27/2023)   Social Connection and Isolation Panel [NHANES]    Frequency of Communication with Friends and Family: More than three times a week    Frequency of Social Gatherings with Friends and Family: Once a week    Attends Religious Services: Never    Database administrator or Organizations: No    Attends Banker Meetings: Never    Marital Status: Married  Catering manager Violence: Not At Risk (05/27/2023)   Humiliation, Afraid, Rape, and Kick questionnaire    Fear of Current or Ex-Partner: No    Emotionally Abused: No    Physically Abused: No    Sexually Abused: No   Family Status  Relation Name Status   Mother  Deceased       died  from old age   Father  Deceased   Sister  Alive   Brother  Alive   Sister  Alive   Other family hx Alive   Neg Hx  (Not Specified)  No partnership data on file   Family History  Problem Relation Age of Onset   Alcohol abuse Father    Hypertension Brother    Cancer Other    Diabetes Neg Hx    Allergies  Allergen Reactions   Dilaudid [Hydromorphone Hcl] Other (See Comments)    According to patient's wife he had hallucinations after having dilaudid    Patient Care Team: Lamon Pillow, MD as PCP - General (Family Medicine) Belva Boyden  J, MD as Consulting Physician (Cardiology) Pa, [facility]int, [place] (Orthopedic Surgery) Hattie Lints, MD as Referring Physician (Orthopedic Surgery) Clemetine Cypher, DPM as Consulting Physician (Podiatry)   Medications: Outpatient Medications Prior to Visit  Medication Sig   ALPRAZolam  (XANAX ) 1 MG tablet TAKE 1/2 TO 1 (ONE-HALF TO ONE) TABLET BY MOUTH THREE TIMES DAILY AS NEEDED . APPOINTMENT REQUIRED FOR FUTURE REFILLS   amiodarone  (PACERONE ) 200 MG tablet Take 1 tablet by mouth once daily   apixaban  (ELIQUIS ) 5 MG TABS tablet Take 1 tablet by mouth twice daily   atorvastatin  (LIPITOR) 40 MG tablet Take 1 tablet by mouth once daily   carvedilol  (COREG ) 6.25 MG tablet Take 1 tablet by mouth twice daily   ezetimibe  (ZETIA ) 10 MG tablet Take 1 tablet by mouth once daily   ibuprofen (ADVIL,MOTRIN) 200 MG tablet Take 400 mg by mouth every 8 (eight) hours as needed (for pain.).  (Patient not taking: Reported on 05/27/2023)   metFORMIN  (GLUCOPHAGE -XR) 500 MG 24 hr tablet Take 1 tablet by mouth once daily with breakfast   Multiple Vitamin (MULTIVITAMIN WITH MINERALS) TABS tablet Take 1 tablet by mouth daily. Centrum Silver Men 50+   sacubitril-valsartan (ENTRESTO ) 49-51 MG Take 1 tablet by mouth 2 (two) times daily.   torsemide  (DEMADEX ) 5 MG tablet TAKE 1 TO 2 TABLETS BY MOUTH ONCE DAILY   traZODone  (DESYREL ) 100 MG tablet TAKE  1/2 TO 1 (ONE-HALF TO ONE) TABLET BY MOUTH AT BEDTIME AS NEEDED FOR SLEEP (Patient not taking: Reported on 05/27/2023)   cyclobenzaprine  (FLEXERIL ) 5 MG tablet TAKE 1 TO 2 TABLETS BY MOUTH TWICE DAILY AS NEEDED FOR MUSCLE SPASM   No facility-administered medications prior to visit.     Objective    BP 126/76 (BP Location: Left Arm, Patient Position: Sitting, Cuff Size: Normal)   Pulse 60   Resp 16   Ht 6' (1.829 m)   Wt 178 lb (80.7 kg)   SpO2 100%   BMI 24.14 kg/m  {Insert last BP/Wt (optional):23777}{See vitals history (optional):1}  Physical Exam  General Appearance:    Well developed, well nourished male. Alert, cooperative, in no acute distress, appears stated age  Head:    Normocephalic, without obvious abnormality, atraumatic  Eyes:    PERRL, conjunctiva/corneas clear, EOM's intact, fundi    benign, both eyes       Ears:    Normal TM's and external ear canals, both ears  Nose:   Nares normal, septum midline, mucosa normal, no drainage   or sinus tenderness  Throat:   Lips, mucosa, and tongue normal; teeth and gums normal  Neck:   Supple, symmetrical, trachea midline, no adenopathy;       thyroid :  No enlargement/tenderness/nodules; no carotid   bruit or JVD  Back:     Symmetric, no curvature, ROM normal, no CVA tenderness  Lungs:     Clear to auscultation bilaterally, respirations unlabored  Chest wall:    No tenderness or deformity  Heart:    Normal heart rate. Irregularly irregular rhythm.  3/6 blowing, holosystolic murmur S1 and S2 normal  Abdomen:     Soft, non-tender, bowel sounds active all four quadrants,    no masses, no organomegaly  Genitalia:    deferred  Rectal:    deferred  Extremities:   All extremities are intact. No cyanosis or edema  Pulses:   2+ and symmetric all extremities  Skin:   Skin color, texture, turgor normal, no rashes  or lesions  Lymph nodes:   Cervical, supraclavicular, and axillary nodes normal  Neurologic:   CNII-XII intact. Normal  strength, sensation and reflexes      throughout       Last depression screening scores    05/27/2023    3:17 PM 05/21/2022    3:46 PM 02/22/2022    4:22 PM  PHQ 2/9 Scores  PHQ - 2 Score 1 0 0  PHQ- 9 Score 1  0   Last fall risk screening    05/27/2023    3:20 PM  Fall Risk   Falls in the past year? 1  Number falls in past yr: 1  Injury with Fall? 1  Risk for fall due to : Impaired balance/gait  Follow up Falls evaluation completed;Falls prevention discussed   Last Audit-C alcohol use screening    05/27/2023    3:16 PM  Alcohol Use Disorder Test (AUDIT)  1. How often do you have a drink containing alcohol? 0  2. How many drinks containing alcohol do you have on a typical day when you are drinking? 0  3. How often do you have six or more drinks on one occasion? 0  AUDIT-C Score 0   A score of 3 or more in women, and 4 or more in men indicates increased risk for alcohol abuse, EXCEPT if all of the points are from question 1   Results for orders placed or performed in visit on 06/04/23  Urine Albumin/Creatinine with ratio (send out) [LAB689]  Result Value Ref Range   Creatinine, Urine 97.3 Not Estab. mg/dL   Microalbumin, Urine 96.2 Not Estab. ug/mL   Microalb/Creat Ratio 24 0 - 29 mg/g creat  CBC  Result Value Ref Range   WBC 9.6 3.4 - 10.8 x10E3/uL   RBC 5.02 4.14 - 5.80 x10E6/uL   Hemoglobin 15.3 13.0 - 17.7 g/dL   Hematocrit 95.2 84.1 - 51.0 %   MCV 88 79 - 97 fL   MCH 30.5 26.6 - 33.0 pg   MCHC 34.7 31.5 - 35.7 g/dL   RDW 32.4 40.1 - 02.7 %   Platelets 318 150 - 450 x10E3/uL  Comprehensive metabolic panel with GFR  Result Value Ref Range   Glucose 124 (H) 70 - 99 mg/dL   BUN 14 8 - 27 mg/dL   Creatinine, Ser 2.53 0.76 - 1.27 mg/dL   eGFR 72 >66 YQ/IHK/7.42   BUN/Creatinine Ratio 12 10 - 24   Sodium 141 134 - 144 mmol/L   Potassium 4.5 3.5 - 5.2 mmol/L   Chloride 103 96 - 106 mmol/L   CO2 23 20 - 29 mmol/L   Calcium  9.8 8.6 - 10.2 mg/dL   Total Protein 7.0  6.0 - 8.5 g/dL   Albumin 4.7 3.9 - 4.9 g/dL   Globulin, Total 2.3 1.5 - 4.5 g/dL   Bilirubin Total 0.8 0.0 - 1.2 mg/dL   Alkaline Phosphatase 91 44 - 121 IU/L   AST 24 0 - 40 IU/L   ALT 32 0 - 44 IU/L  Lipid panel  Result Value Ref Range   Cholesterol, Total 138 100 - 199 mg/dL   Triglycerides 595 0 - 149 mg/dL   HDL 44 >63 mg/dL   VLDL Cholesterol Cal 22 5 - 40 mg/dL   LDL Chol Calc (NIH) 72 0 - 99 mg/dL   Chol/HDL Ratio 3.1 0.0 - 5.0 ratio  PSA Total (Reflex To Free)  Result Value Ref Range   Prostate Specific Ag, Serum  3.2 0.0 - 4.0 ng/mL   Reflex Criteria Comment   Hemoglobin A1c  Result Value Ref Range   Hgb A1c MFr Bld 6.2 (H) 4.8 - 5.6 %   Est. average glucose Bld gHb Est-mCnc 131 mg/dL    Assessment & Plan    Routine Health Maintenance and Physical Exam  Exercise Activities and Dietary recommendations  Goals      DIET - EAT MORE FRUITS AND VEGETABLES        Immunization History  Administered Date(s) Administered   Influenza Inj Mdck Quad Pf 12/27/2016, 10/28/2017   Influenza Split 12/13/2005, 11/16/2009, 11/19/2011   Influenza, Quadrivalent, Recombinant, Inj, Pf 12/04/2021   Influenza, Seasonal, Injecte, Preservative Fre 11/20/2022   Influenza,inj,Quad PF,6+ Mos 12/12/2014, 12/13/2015, 10/12/2018, 12/28/2019, 11/07/2020   Influenza-Unspecified 12/27/2016   Janssen (J&J) SARS-COV-2 Vaccination 07/24/2019   Moderna Sars-Covid-2 Vaccination 01/26/2020   PNEUMOCOCCAL CONJUGATE-20 06/04/2023   Pneumococcal Polysaccharide-23 01/21/2009   Tdap 07/16/2007, 05/09/2021   Zoster Recombinant(Shingrix ) 05/09/2021, 09/19/2021    Health Maintenance  Topic Date Due   HIV Screening  Never done   Lung Cancer Screening  05/24/2010   COVID-19 Vaccine (3 - 2024-25 season) 10/20/2022   OPHTHALMOLOGY EXAM  02/14/2023   INFLUENZA VACCINE  09/19/2023   HEMOGLOBIN A1C  12/04/2023   Medicare Annual Wellness (AWV)  05/26/2024   Diabetic kidney evaluation - eGFR measurement   06/03/2024   Diabetic kidney evaluation - Urine ACR  06/03/2024   Colonoscopy  05/05/2025   DTaP/Tdap/Td (3 - Td or Tdap) 05/10/2031   Pneumococcal Vaccine 31-73 Years old  Completed   Hepatitis C Screening  Completed   Zoster Vaccines- Shingrix   Completed   HPV VACCINES  Aged Out   Meningococcal B Vaccine  Aged Out    Discussed health benefits of physical activity, and encouraged him to engage in regular exercise appropriate for his age and condition.   2. Prostate cancer screening  - PSA Total (Reflex To Free)  3. Need for pneumococcal vaccination  - Pneumococcal conjugate vaccine 20-valent (Prevnar 20)  4. Controlled type 2 diabetes mellitus without complication, without long-term current use of insulin (HCC)  - Urine Albumin/Creatinine with ratio (send out) [LAB689] - Hemoglobin A1c  5. Primary hypertension Well controlled.  Continue current medications.    6. Atrial fibrillation, unspecified type (HCC) Asymptomatic. Compliant with medication.  Continue aggressive risk factor modification.  Continue routine follow up cardiology.   7. Acquired coagulation disorder (HCC) Continue Eliquis  as pre cardiology   8. Low back pain without sciatica Start back on - cyclobenzaprine  (FLEXERIL ) 10 MG tablet; Take 0.5-1 tablets (5-10 mg total) by mouth 3 (three) times daily as needed for muscle spasms.  Dispense: 180 tablet; Refill: 3  9. Mixed hyperlipidemia He is tolerating atorvastatin  well with no adverse effects.   - CBC - Comprehensive metabolic panel with GFR - Lipid panel         Jeralene Mom, MD  [facility] [phone] (phone) [phone] (fax)  [facility]

## 2023-06-26 ENCOUNTER — Other Ambulatory Visit: Payer: Self-pay | Admitting: Family Medicine

## 2023-06-26 DIAGNOSIS — I5189 Other ill-defined heart diseases: Secondary | ICD-10-CM

## 2023-06-26 DIAGNOSIS — I5022 Chronic systolic (congestive) heart failure: Secondary | ICD-10-CM

## 2023-07-21 ENCOUNTER — Other Ambulatory Visit: Payer: Self-pay | Admitting: Family Medicine

## 2023-07-21 DIAGNOSIS — E119 Type 2 diabetes mellitus without complications: Secondary | ICD-10-CM

## 2023-09-29 NOTE — Progress Notes (Signed)
 This patient is appearing on a report for being at risk of failing the adherence measure for cholesterol (statin) medications this calendar year.   Medication: atorvastatin  40 mg  Last fill date: 08/06/23 for 90 day supply  Insurance report was not up to date. No action needed at this time.   Sharrieff Spratlin E. Marsh, PharmD Clinical Pharmacist [facility] [phone]

## 2023-11-11 NOTE — Progress Notes (Signed)
 Pharmacy Quality Measure Review  This patient is appearing on a report for being at risk of failing the adherence measure for cholesterol (statin) medications this calendar year.   Medication: atorvastatin  Last fill date: 10/13/23 for 90 day supply  Insurance report was not up to date. No action needed at this time.   Sharisa Toves E. Marsh, PharmD Clinical Pharmacist [facility] [phone]

## 2023-12-04 ENCOUNTER — Other Ambulatory Visit: Payer: Self-pay | Admitting: Family Medicine

## 2023-12-04 DIAGNOSIS — F419 Anxiety disorder, unspecified: Secondary | ICD-10-CM

## 2023-12-19 NOTE — Progress Notes (Signed)
 My chart message also sent.

## 2023-12-30 NOTE — Progress Notes (Signed)
 Pharmacy Quality Measure Review  This patient is appearing on a report for being at risk of failing the adherence measure for cholesterol (statin) medications this calendar year.   Medication: atorvastatin  Last fill date: 10/13/23 for 90 day supply  Contacted pharmacy to facilitate refills.  Linzi Ohlinger E. Marsh, PharmD, CPP Clinical Pharmacist [facility] [phone]

## 2024-01-13 ENCOUNTER — Other Ambulatory Visit: Payer: Self-pay | Admitting: Family Medicine

## 2024-01-13 DIAGNOSIS — I480 Paroxysmal atrial fibrillation: Secondary | ICD-10-CM

## 2024-02-18 ENCOUNTER — Other Ambulatory Visit: Payer: Self-pay | Admitting: Family Medicine

## 2024-02-18 DIAGNOSIS — M545 Low back pain, unspecified: Secondary | ICD-10-CM

## 2024-02-18 DIAGNOSIS — F419 Anxiety disorder, unspecified: Secondary | ICD-10-CM

## 2024-03-07 ENCOUNTER — Encounter: Payer: Self-pay | Admitting: Physician Assistant

## 2024-03-07 NOTE — Progress Notes (Unsigned)
 "  Cardiology Office Note    Date:  03/08/2024   ID:  Samuel Woods, DOB 1960/12/06, MRN [id number]  PCP:  Gasper Nancyann BRAVO, MD  Cardiologist:  Evalene Lunger, MD  Electrophysiologist:  None   Chief Complaint: Follow up  History of Present Illness:   Samuel Woods is a 64 y.o. male with history of CAD, HFmrEF, PAF status post DCCV in 2019 maintained on amiodarone , mitral valve regurgitation status post MVR in 2004, DM2, HTN, HLD, low back pain s/p lumbar fusion surgery, and prior tobacco use who presents for follow up of CAD, cardiomyopathy, and valvular heart disease.  He was previously followed by Dr. Fernand, establishing with Dr. Lunger in 2021.  While followed by Dr. Fernand, echo in 06/2017 demonstrated an EF of 15 to 20%.  Subsequent echoes in 2019 continued to show cardiomyopathy with an EF around 35%.  It is unclear if the patient was in A-fib during these studies.  He underwent TEE-guided DCCV in 10/2017.  Subsequent echo in 11/2017 showed an EF of 43% which further improved to 50 to 55% by echo in 2020.  Echo upon establishing with our office demonstrated an EF of 55 to 60%, no regional wall motion abnormalities, grade 2 diastolic dysfunction, low normal RV systolic function, mildly dilated left atrium, prior repair of mitral valve with annuloplasty ring with moderate regurgitation, and mild aortic valve sclerosis.  Calcium  score in 2023 office 678 which was the 91st percentile.  He was last seen in the office in 01/2023 and was doing well from a cardiac perspective.  Echo in 02/2023 showed an EF of 40 to 45%, inferior wall hypokinesis, mildly dilated LV internal cavity size, normal RV systolic function and ventricular cavity size, mildly elevated RVSP estimated 43.7 mmHg, moderately dilated left atrium, prior repair of mitral valve with moderate regurgitation, mild aortic insufficiency, and a normal CVP.  Subsequent Lexiscan  MPI in 02/2023 showed no significant ischemia with a small region of a fixed  defect in the distal inferior lateral and apical region, EF of 49%, and was read as low risk.  I am meeting him for the first time today.  He comes in doing well from a cardiac perspective and is without symptoms of angina or cardiac decompensation.  No dizziness, near-syncope, or syncope.  He does feel like he has fallen approximately 5 times over the past 12 months, with each of these being mechanical.  He does notice some occasional blood on toilet tissue when bleeding from a hemorrhoid.  No lower extremity swelling or progressive orthopnea.  No symptoms concerning for recurrence of A-fib, typically felt palpitations.  Weight stable.  Has not seen a dentist in many years.    Labs independently reviewed: 05/2023 - A1c 6.2, TC 138, TG 121, HDL 44, LDL 72, BUN 14, serum creatinine 1.14, potassium 4.5, albumin 4.7, AST/ALT normal, Hgb 15.3, PLT 318 09/2021 - TSH normal  Past Medical History:  Diagnosis Date   Afib (HCC)    Compensated heart failure with mildly reduced ejection fraction (HFmrEF) (HCC)    Diabetes mellitus (HCC)    History of chicken pox    History of lung abscess 01/18/2009   History of measles    Hyperlipidemia    Hypertension    Mitral regurgitation     Past Surgical History:  Procedure Laterality Date   CARDIOVERSION Right 11/17/2017   Procedure: CARDIOVERSION;  Surgeon: Fernand Denyse LABOR, MD;  Location: [facility] ORS;  Service: Cardiovascular;  Laterality: Right;  COLONOSCOPY WITH PROPOFOL  N/A 05/05/2020   Procedure: COLONOSCOPY WITH PROPOFOL ;  Surgeon: Therisa Bi, MD;  Location: [facility] ENDOSCOPY;  Service: Gastroenterology;  Laterality: N/A;   DOPPLER ECHOCARDIOGRAPHY  08/12/2007   Mild to moderate stenosis. Trace TR.Mild pulmonary hypertension. Borderline LVH. Mild depressed right systolic function. LVEF=50-55%   MITRAL VALVE REPAIR  05/2002   SPINAL FUSION     TEE WITHOUT CARDIOVERSION N/A 11/17/2017   Procedure: TRANSESOPHAGEAL ECHOCARDIOGRAM (TEE);  Surgeon: Fernand Denyse LABOR, MD;  Location: [facility] ORS;  Service: Cardiovascular;  Laterality: N/A;    Current Medications: Active Medications[1]  Allergies:   Dilaudid [hydromorphone hcl]   Social History   Socioeconomic History   Marital status: Married    Spouse name: Not on file   Number of children: 2   Years of education: Not on file   Highest education level: Some college, no degree  Occupational History   Occupation: Seasonal outside Higher Education Careers Adviser: [employer]  Tobacco Use   Smoking status: Former    Current packs/day: 0.00    Average packs/day: 0.5 packs/day for 15.0 years (7.5 ttl pk-yrs)    Types: Cigarettes, E-cigarettes    Start date: 07/20/2002    Quit date: 07/19/2017    Years since quitting: 6.6   Smokeless tobacco: Never   Tobacco comments:    stopped smoking 10/2017. I vape sometimes.  Vaping Use   Vaping status: Never Used  Substance and Sexual Activity   Alcohol use: Not Currently    Comment: occasional use   Drug use: Yes    Types: Marijuana    Comment: gummie   Sexual activity: Not on file  Other Topics Concern   Not on file  Social History Narrative   Not on file   Social Drivers of Health   Tobacco Use: Medium Risk (03/08/2024)   Patient History    Smoking Tobacco Use: Former    Smokeless Tobacco Use: Never    Passive Exposure: Not on file  Financial Resource Strain: Low Risk (05/27/2023)   Overall Financial Resource Strain (CARDIA)    Difficulty of Paying Living Expenses: Not hard at all  Food Insecurity: No Food Insecurity (05/27/2023)   Hunger Vital Sign    Worried About Running Out of Food in the Last Year: Never true    Ran Out of Food in the Last Year: Never true  Transportation Needs: No Transportation Needs (05/27/2023)   PRAPARE - Administrator, Civil Service (Medical): No    Lack of Transportation (Non-Medical): No  Physical Activity: Insufficiently Active (05/27/2023)   Exercise Vital Sign    Days of Exercise per  Week: 3 days    Minutes of Exercise per Session: 30 min  Stress: No Stress Concern Present (05/27/2023)   [employer] of Occupational Health - Occupational Stress Questionnaire    Feeling of Stress : Only a little  Social Connections: Moderately Isolated (05/27/2023)   Social Connection and Isolation Panel    Frequency of Communication with Friends and Family: More than three times a week    Frequency of Social Gatherings with Friends and Family: Once a week    Attends Religious Services: Never    Database Administrator or Organizations: No    Attends Banker Meetings: Never    Marital Status: Married  Depression (PHQ2-9): Low Risk (05/27/2023)   Depression (PHQ2-9)    PHQ-2 Score: 1  Alcohol Screen: Low Risk (05/27/2023)   Alcohol Screen  Last Alcohol Screening Score (AUDIT): 0  Housing: Unknown (05/27/2023)   Housing Stability Vital Sign    Unable to Pay for Housing in the Last Year: No    Number of Times Moved in the Last Year: Not on file    Homeless in the Last Year: No  Utilities: Not At Risk (05/27/2023)   AHC Utilities    Threatened with loss of utilities: No  Health Literacy: Adequate Health Literacy (05/27/2023)   B1300 Health Literacy    Frequency of need for help with medical instructions: Never     Family History:  The patient's family history includes Alcohol abuse in his father; Cancer in an other family member; Hypertension in his brother. There is no history of Diabetes.  ROS:   12-point review of systems is negative unless otherwise noted in the HPI.   EKGs/Labs/Other Studies Reviewed:    Studies reviewed were summarized above. The additional studies were reviewed today:  Lexiscan  MPI 03/06/2023: Pharmacological myocardial perfusion imaging study with no significant  ischemia Small region fixed defect in the distal inferolateral and apical region Normal wall motion, EF estimated at 49%, GI uptake artifact noted No EKG changes concerning for  ischemia at peak stress or in recovery. CT attenuation correction images with mild aortic atherosclerosis, mild coronary calcification Low risk scan __________  2D echo 02/27/2023: 1. Left ventricular ejection fraction, by estimation, is 40 to 45%. The  left ventricle has mildly decreased function. The left ventricle  demonstrates regional wall motion abnormalities (inferior wall  hypokinesis). The left ventricular internal cavity  size was mildly dilated. Left ventricular diastolic parameters are  indeterminate.   2. Right ventricular systolic function is normal. The right ventricular  size is normal. There is mildly elevated pulmonary artery systolic  pressure. The estimated right ventricular systolic pressure is 43.7 mmHg.   3. Left atrial size was moderately dilated.   4. The mitral valve has been repaired/replaced,annuloplasty ring.  Moderate mitral valve regurgitation. No evidence of mitral stenosis. The  mean mitral valve gradient is 4.0 mmHg.   5. The aortic valve is normal in structure. Aortic valve regurgitation is  mild. No aortic stenosis is present.   6. The inferior vena cava is normal in size with greater than 50%  respiratory variability, suggesting right atrial pressure of 3 mmHg.  __________  Calcium  score 08/27/2021: Ascending Aorta: Normal size, ascending and descending aorta calcifications.   Pericardium: Normal   Coronary arteries: Normal origin of left and right coronary arteries. Distribution of arterial calcifications if present, as noted below;   LM 218   LAD 142   LCx 16.2   RCA 302   Total 678   IMPRESSION AND RECOMMENDATION: 1. Coronary calcium  score of 678. This was 91st percentile for age and sex matched control. 2. CAC >300 in LM, LAD, LCx, RCA. CAC-DRS A3/N3. 3. Recommend aspirin  and statin if no contraindications. 4. Recommend cardiology consultation. 5. Continue heart healthy lifestyle and risk factor modification. __________  2D  echo 02/09/2020: 1. Left ventricular ejection fraction, by estimation, is 55 to 60%. Left  ventricular ejection fraction by 2D MOD biplane is 56.0 %. The left  ventricle has normal function. The left ventricle has no regional wall  motion abnormalities. Left ventricular  diastolic parameters are consistent with Grade II diastolic dysfunction  (pseudonormalization).   2. Right ventricular systolic function is low normal. The right  ventricular size is normal.   3. Left atrial size was mildly dilated.   4. The  mitral valve has been repaired/replaced. Moderate mitral valve  regurgitation. There is a prosthetic annuloplasty ring present in the  mitral position.   5. The aortic valve is tricuspid. Aortic valve regurgitation is not  visualized. Mild aortic valve sclerosis is present, with no evidence of  aortic valve stenosis.  __________  See CV procedures in Epic for more remote cardiac imaging  EKG:  EKG is ordered today.  The EKG ordered today demonstrates sinus bradycardia with sinus arrhythmia, 58 bpm, possible prior anterior and inferior MI vs lead placement, nonspecific st/t changes  Recent Labs: 06/04/2023: ALT 32; BUN 14; Creatinine, Ser 1.14; Hemoglobin 15.3; Platelets 318; Potassium 4.5; Sodium 141  Recent Lipid Panel    Component Value Date/Time   CHOL 138 06/04/2023 0840   TRIG 121 06/04/2023 0840   HDL 44 06/04/2023 0840   CHOLHDL 3.1 06/04/2023 0840   LDLCALC 72 06/04/2023 0840    PHYSICAL EXAM:    VS:  BP 118/62 (BP Location: Left Arm, Patient Position: Sitting, Cuff Size: Normal)   Pulse (!) 58   Ht 6' (1.829 m)   Wt 173 lb 3.2 oz (78.6 kg)   SpO2 97%   BMI 23.49 kg/m   BMI: Body mass index is 23.49 kg/m.  Physical Exam Vitals reviewed.  Constitutional:      Appearance: He is well-developed.  HENT:     Head: Normocephalic and atraumatic.  Eyes:     General:        Right eye: No discharge.        Left eye: No discharge.  Cardiovascular:     Rate and  Rhythm: Normal rate and regular rhythm.     Pulses:          Posterior tibial pulses are 2+ on the right side and 2+ on the left side.     Heart sounds: S1 normal and S2 normal. Heart sounds not distant. No midsystolic click and no opening snap. Murmur heard.     Systolic murmur is present with a grade of 2/6 at the upper left sternal border and apex.     No friction rub.  Pulmonary:     Effort: Pulmonary effort is normal. No respiratory distress.     Breath sounds: Normal breath sounds. No decreased breath sounds, wheezing, rhonchi or rales.  Musculoskeletal:     Cervical back: Normal range of motion.     Right lower leg: No edema.     Left lower leg: No edema.  Skin:    General: Skin is warm and dry.     Nails: There is no clubbing.  Neurological:     Mental Status: He is alert and oriented to person, place, and time.  Psychiatric:        Speech: Speech normal.        Behavior: Behavior normal.        Thought Content: Thought content normal.        Judgment: Judgment normal.     Wt Readings from Last 3 Encounters:  03/08/24 173 lb 3.2 oz (78.6 kg)  06/04/23 178 lb (80.7 kg)  05/27/23 180 lb 1.6 oz (81.7 kg)     ASSESSMENT & PLAN:   CAD involving native coronary arteries without angina: Prior calcium  score in 2023 of 678 with multivessel coronary artery calcification noted.  Lexiscan  MPI in 02/2023 without evidence of ischemia and read as overall low risk.  Remains on apixaban  in lieu of aspirin  to minimize bleeding risk with underlying A-fib.  Continue atorvastatin  40 mg.  Obtain updated echo to further evaluate cardiomyopathy, should this persist anticipate further ischemic testing as outlined below.  HFmrEF: Euvolemic and well compensated with NYHA class II symptoms.  Unclear etiology of cardiomyopathy at this time.  Echo in 02/2023 showed newly reduced LV systolic function with an EF of 40 to 45% with inferior wall hypokinesis.  Calcium  score in 2023 of 678 with multivessel  coronary calcification noted.  Lexiscan  MPI in 02/2023 without significant ischemia and read as overall low risk.  Update echo to reevaluate cardiomyopathy.  Should LV dysfunction persist, anticipate coronary CTA versus cardiac cath for definitive evaluation of possible ischemic cardiomyopathy given multivessel coronary artery calcification noted on calcium  scoring.  However, TID was normal at 1.09.  For now, remains on carvedilol  6.25 mg twice daily, Entresto  49/51 mg twice daily, and torsemide  5 to 10 mg daily.  Recent labs stable.  PAF with high risk medication use: Maintaining sinus rhythm.  CHADS2VASc at least 4 (CHF, HTN, DM, vascular disease).  He has been maintained on amiodarone  since 2019 (age 67).  Most recent TSH from 2023 normal.  AST/ALT previously noted to be elevated with most recent check in 05/2023 normal.  Given his age of 58, I do not feel like amiodarone  is a great long-term medication for him, and will discontinue this medication.  Continue carvedilol  6.25 mg twice daily.  Should he have recurrence of A-fib off amiodarone , would repeat DCCV and refer to EP.  Mitral valve regurgitation: Status post MVR in 2004.  Echo in 02/2023 showed prior repair of the mitral valve with moderate regurgitation.  Obtain echo.  SBE prophylaxis indicated for all dental procedures.  On apixaban  in lieu of aspirin .  Aortic insufficiency: Mild by echo in 02/2023.  Monitor with periodic echo.  HTN: Blood pressure is well-controlled in the office today.  Continue pharmacotherapy as outlined above.  HLD: LDL 72 in 05/2023.  He remains on atorvastatin  40 mg and ezetimibe  10 mg.  Most recent LFTs normal.     Disposition: F/u with Dr. Gollan or an APP in 4-6 weeks.   Medication Adjustments/Labs and Tests Ordered: Current medicines are reviewed at length with the patient today.  Concerns regarding medicines are outlined above. Medication changes, Labs and Tests ordered today are summarized above and listed in  the Patient Instructions accessible in Encounters.   Signed, Bernardino Bring, PA-C 03/08/2024 3:31 PM     [facility] [place] [address] [place], [place] [address] [phone]     [1]  Current Meds  Medication Sig   ALPRAZolam  (XANAX ) 1 MG tablet TAKE 1/2 TO 1 (ONE-HALF TO ONE) TABLET BY MOUTH THREE TIMES DAILY AS NEEDED   atorvastatin  (LIPITOR) 40 MG tablet Take 1 tablet by mouth once daily   carvedilol  (COREG ) 6.25 MG tablet Take 1 tablet by mouth twice daily   cyclobenzaprine  (FLEXERIL ) 10 MG tablet TAKE 1/2 TO 1 (ONE-HALF TO ONE) TABLET BY MOUTH THREE TIMES DAILY AS NEEDED FOR MUSCLE SPASM   ELIQUIS  5 MG TABS tablet Take 1 tablet by mouth twice daily   ezetimibe  (ZETIA ) 10 MG tablet Take 1 tablet by mouth once daily   metFORMIN  (GLUCOPHAGE -XR) 500 MG 24 hr tablet TAKE 1 TABLET BY MOUTH ONCE DAILY WITH BREAKFAST . APPOINTMENT REQUIRED FOR FUTURE REFILLS   Multiple Vitamin (MULTIVITAMIN WITH MINERALS) TABS tablet Take 1 tablet by mouth daily. Centrum Silver Men 50+   sacubitril-valsartan (ENTRESTO ) 49-51 MG Take 1 tablet  by mouth 2 (two) times daily.   torsemide  (DEMADEX ) 5 MG tablet TAKE 1 TO 2 TABLETS BY MOUTH ONCE DAILY   [DISCONTINUED] amiodarone  (PACERONE ) 200 MG tablet Take 1 tablet by mouth once daily   "

## 2024-03-08 ENCOUNTER — Encounter: Payer: Self-pay | Admitting: Physician Assistant

## 2024-03-08 ENCOUNTER — Ambulatory Visit: Attending: Physician Assistant | Admitting: Physician Assistant

## 2024-03-08 VITALS — BP 118/62 | HR 58 | Ht 72.0 in | Wt 173.2 lb

## 2024-03-08 DIAGNOSIS — Z79899 Other long term (current) drug therapy: Secondary | ICD-10-CM

## 2024-03-08 DIAGNOSIS — I340 Nonrheumatic mitral (valve) insufficiency: Secondary | ICD-10-CM

## 2024-03-08 DIAGNOSIS — I351 Nonrheumatic aortic (valve) insufficiency: Secondary | ICD-10-CM | POA: Diagnosis not present

## 2024-03-08 DIAGNOSIS — I480 Paroxysmal atrial fibrillation: Secondary | ICD-10-CM | POA: Diagnosis not present

## 2024-03-08 DIAGNOSIS — Z9889 Other specified postprocedural states: Secondary | ICD-10-CM

## 2024-03-08 DIAGNOSIS — E785 Hyperlipidemia, unspecified: Secondary | ICD-10-CM | POA: Diagnosis not present

## 2024-03-08 DIAGNOSIS — I5022 Chronic systolic (congestive) heart failure: Secondary | ICD-10-CM | POA: Diagnosis not present

## 2024-03-08 DIAGNOSIS — D6859 Other primary thrombophilia: Secondary | ICD-10-CM | POA: Diagnosis not present

## 2024-03-08 DIAGNOSIS — I10 Essential (primary) hypertension: Secondary | ICD-10-CM | POA: Diagnosis not present

## 2024-03-08 DIAGNOSIS — I2510 Atherosclerotic heart disease of native coronary artery without angina pectoris: Secondary | ICD-10-CM

## 2024-03-08 NOTE — Patient Instructions (Signed)
 Medication Instructions:  Your physician recommends the following medication changes.  STOP TAKING: Amiodarone    *If you need a refill on your cardiac medications before your next appointment, please call your pharmacy*  Lab Work: None ordered at this time  If you have labs (blood work) drawn today and your tests are completely normal, you will receive your results only by: MyChart Message (if you have MyChart) OR A paper copy in the mail If you have any lab test that is abnormal or we need to change your treatment, we will call you to review the results.  Testing/Procedures: Your physician has requested that you have an echocardiogram. Echocardiography is a painless test that uses sound waves to create images of your heart. It provides your doctor with information about the size and shape of your heart and how well your hearts chambers and valves are working.   You may receive an ultrasound enhancing agent through an IV if needed to better visualize your heart during the echo. This procedure takes approximately one hour.  There are no restrictions for this procedure.  This will take place at [address] [facility]) [address], [place] [address]  Please note: We ask at that you not bring children with you during ultrasound (echo/ vascular) testing. Due to room size and safety concerns, children are not allowed in the ultrasound rooms during exams. Our front office staff cannot provide observation of children in our lobby area while testing is being conducted. An adult accompanying a patient to their appointment will only be allowed in the ultrasound room at the discretion of the ultrasound technician under special circumstances. We apologize for any inconvenience.   Follow-Up: At [facility], you and your health needs are our priority.  As part of our continuing mission to provide you with exceptional heart care, our providers are all part of one team.  This team  includes your primary Cardiologist (physician) and Advanced Practice Providers or APPs (Physician Assistants and Nurse Practitioners) who all work together to provide you with the care you need, when you need it.  Your next appointment:   4 to 6 week(s)  Provider:   You may see Timothy Gollan, MD or Bernardino Bring, PA-C

## 2024-03-13 ENCOUNTER — Other Ambulatory Visit: Payer: Self-pay | Admitting: Cardiovascular Disease

## 2024-03-19 ENCOUNTER — Ambulatory Visit: Payer: Self-pay | Admitting: Physician Assistant

## 2024-03-19 ENCOUNTER — Ambulatory Visit: Attending: Physician Assistant

## 2024-03-19 DIAGNOSIS — I340 Nonrheumatic mitral (valve) insufficiency: Secondary | ICD-10-CM | POA: Diagnosis not present

## 2024-03-19 DIAGNOSIS — I5022 Chronic systolic (congestive) heart failure: Secondary | ICD-10-CM | POA: Diagnosis not present

## 2024-03-19 LAB — ECHOCARDIOGRAM COMPLETE
AR max vel: 3.32 cm2
AV Area VTI: 2.96 cm2
AV Area mean vel: 3.02 cm2
AV Mean grad: 3 mmHg
AV Peak grad: 5.7 mmHg
Ao pk vel: 1.19 m/s
Area-P 1/2: 2.66 cm2
Calc EF: 41.5 %
MV VTI: 1.81 cm2
S' Lateral: 4.1 cm
Single Plane A2C EF: 44.2 %
Single Plane A4C EF: 40.7 %

## 2024-03-22 ENCOUNTER — Ambulatory Visit: Admitting: Physician Assistant

## 2024-03-22 ENCOUNTER — Encounter: Payer: Self-pay | Admitting: Physician Assistant

## 2024-03-22 VITALS — BP 120/70 | HR 62 | Ht 72.0 in | Wt 173.4 lb

## 2024-03-22 DIAGNOSIS — I340 Nonrheumatic mitral (valve) insufficiency: Secondary | ICD-10-CM

## 2024-03-22 DIAGNOSIS — I5022 Chronic systolic (congestive) heart failure: Secondary | ICD-10-CM | POA: Diagnosis not present

## 2024-03-22 DIAGNOSIS — I351 Nonrheumatic aortic (valve) insufficiency: Secondary | ICD-10-CM

## 2024-03-22 DIAGNOSIS — E785 Hyperlipidemia, unspecified: Secondary | ICD-10-CM | POA: Diagnosis not present

## 2024-03-22 DIAGNOSIS — R0609 Other forms of dyspnea: Secondary | ICD-10-CM

## 2024-03-22 DIAGNOSIS — Z9889 Other specified postprocedural states: Secondary | ICD-10-CM | POA: Diagnosis not present

## 2024-03-22 DIAGNOSIS — I10 Essential (primary) hypertension: Secondary | ICD-10-CM

## 2024-03-22 DIAGNOSIS — I2510 Atherosclerotic heart disease of native coronary artery without angina pectoris: Secondary | ICD-10-CM

## 2024-03-22 DIAGNOSIS — I480 Paroxysmal atrial fibrillation: Secondary | ICD-10-CM

## 2024-03-22 DIAGNOSIS — D6859 Other primary thrombophilia: Secondary | ICD-10-CM | POA: Diagnosis not present

## 2024-03-22 DIAGNOSIS — Z79899 Other long term (current) drug therapy: Secondary | ICD-10-CM

## 2024-03-22 DIAGNOSIS — I272 Pulmonary hypertension, unspecified: Secondary | ICD-10-CM

## 2024-03-22 MED ORDER — METOPROLOL TARTRATE 50 MG PO TABS: ORAL_TABLET | ORAL | 0 refills | Status: AC

## 2024-03-22 MED ORDER — TORSEMIDE 5 MG PO TABS: 5.0000 mg | ORAL_TABLET | Freq: Every day | ORAL | 3 refills | Status: AC

## 2024-03-22 NOTE — Patient Instructions (Signed)
 Medication Instructions:  Your physician recommends the following medication changes.  INCREASE: Torsemide  to 10 mg daily for 1 WEEK then resume at 5 mg daily   *If you need a refill on your cardiac medications before your next appointment, please call your pharmacy*  Lab Work: Your provider would like for you to return in 1 week to have the following labs drawn: BMeT.   Please go to [facility] [address] ([facility]) [address], [place] [address] You do not need an appointment.  They are open from 8 am- 4:30 pm.  Lunch from 1:00 pm- 2:00 pm You DO NOT need to be fasting.   You may also go to one of the following [facility]:  [address] [place], [place] [address] Phone: [phone] Lab hours: Mon-Fri 8 am- 5 pm    Lunch 12 pm- 1 pm  [address] [place],  [place]  [address]  [place]  Phone: [phone] Lab hours: 7 am- 4 pm Lunch 12 pm-1 pm   [address] [place],  [place]  [address]  [place]  Phone: [phone] Lab hours: Mon-Fri 8 am- 5 pm    Lunch 12 pm- 1 pm  If you have labs (blood work) drawn today and your tests are completely normal, you will receive your results only by: MyChart Message (if you have MyChart) OR A paper copy in the mail If you have any lab test that is abnormal or we need to change your treatment, we will call you to review the results.  Testing/Procedures:   Your cardiac CT will be scheduled at the below location:    [facility] [address] [place], [place] [address] [phone]  If scheduled at [facility], please arrive to the Heart and Vascular Center 15 mins early for check-in and test prep.  There is spacious parking and easy access to the radiology department from the [facility] entrance. Please enter here and check-in with the desk attendant.   Please follow these instructions carefully (unless otherwise directed):  An IV will be required for this  test and Nitroglycerin will be given.  Hold all erectile dysfunction medications at least 3 days (72 hrs) prior to test. (Ie viagra , cialis, sildenafil , tadalafil, etc)   On the Night Before the Test: Be sure to Drink plenty of water. Do not consume any caffeinated/decaffeinated beverages or chocolate 12 hours prior to your test. Do not take any antihistamines 12 hours prior to your test.  On the Day of the Test: Drink plenty of water until 1 hour prior to the test. Do not eat any food 1 hour prior to test. You may take your regular medications prior to the test.  Take metoprolol  (Lopressor ) two hours prior to test.      After the Test: Drink plenty of water. After receiving IV contrast, you may experience a mild flushed feeling. This is normal. On occasion, you may experience a mild rash up to 24 hours after the test. This is not dangerous. If this occurs, you can take Benadryl 25 mg, Zyrtec, Claritin, or Allegra and increase your fluid intake. (Patients taking Tikosyn should avoid Benadryl, and may take Zyrtec, Claritin, or Allegra) If you experience trouble breathing, this can be serious. If it is severe call [phone] IMMEDIATELY. If it is mild, please call our office.  We will call to schedule your test 2-4 weeks out understanding that some insurance companies will need an authorization prior to the service  being performed.   For more information and frequently asked questions, please visit our website : [email or web address]  For non-scheduling related questions, please contact the cardiac imaging nurse navigator should you have any questions/concerns: Cardiac Imaging Nurse Navigators Direct Office Dial: [phone]   For scheduling needs, including cancellations and rescheduling, please call Brittany, [phone].  For billing questions, please call [phone].    Follow-Up: At [facility], you and your health needs are our priority.  As part of our  continuing mission to provide you with exceptional heart care, our providers are all part of one team.  This team includes your primary Cardiologist (physician) and Advanced Practice Providers or APPs (Physician Assistants and Nurse Practitioners) who all work together to provide you with the care you need, when you need it.  Your next appointment:   2 month(s)  Provider:   You may see Timothy Gollan, MD or Bernardino Bring, PA-C

## 2024-03-24 ENCOUNTER — Other Ambulatory Visit
Admission: RE | Admit: 2024-03-24 | Discharge: 2024-03-24 | Disposition: A | Source: Ambulatory Visit | Attending: Physician Assistant | Admitting: Physician Assistant

## 2024-03-24 LAB — BASIC METABOLIC PANEL WITH GFR
Anion gap: 12 (ref 5–15)
BUN: 18 mg/dL (ref 8–23)
CO2: 27 mmol/L (ref 22–32)
Calcium: 9.7 mg/dL (ref 8.9–10.3)
Chloride: 101 mmol/L (ref 98–111)
Creatinine, Ser: 1.12 mg/dL (ref 0.61–1.24)
GFR, Estimated: >60 mL/min
Glucose, Bld: 140 mg/dL — ABNORMAL HIGH (ref 70–99)
Potassium: 4.2 mmol/L (ref 3.5–5.1)
Sodium: 141 mmol/L (ref 135–145)

## 2024-03-25 ENCOUNTER — Ambulatory Visit: Payer: Self-pay | Admitting: Physician Assistant

## 2024-04-12 ENCOUNTER — Ambulatory Visit

## 2024-04-13 ENCOUNTER — Ambulatory Visit: Admitting: Physician Assistant

## 2024-05-26 ENCOUNTER — Ambulatory Visit: Admitting: Physician Assistant

## 2024-06-01 ENCOUNTER — Ambulatory Visit

## 2024-06-04 ENCOUNTER — Encounter: Admitting: Family Medicine
